# Patient Record
Sex: Male | Born: 1970 | Race: Black or African American | Hispanic: No | Marital: Married | Smoking: Former smoker
Health system: Southern US, Community
[De-identification: ages and names within clinical notes are randomized; demographics above are authoritative.]

## PROBLEM LIST (undated history)

## (undated) DIAGNOSIS — E119 Type 2 diabetes mellitus without complications: Secondary | ICD-10-CM

## (undated) DIAGNOSIS — I2510 Atherosclerotic heart disease of native coronary artery without angina pectoris: Secondary | ICD-10-CM

## (undated) DIAGNOSIS — E669 Obesity, unspecified: Secondary | ICD-10-CM

## (undated) DIAGNOSIS — E781 Pure hyperglyceridemia: Secondary | ICD-10-CM

## (undated) DIAGNOSIS — E785 Hyperlipidemia, unspecified: Secondary | ICD-10-CM

## (undated) DIAGNOSIS — D573 Sickle-cell trait: Secondary | ICD-10-CM

## (undated) DIAGNOSIS — T7840XA Allergy, unspecified, initial encounter: Secondary | ICD-10-CM

## (undated) DIAGNOSIS — R739 Hyperglycemia, unspecified: Secondary | ICD-10-CM

## (undated) HISTORY — DX: Sickle-cell trait: D57.3

## (undated) HISTORY — DX: Pure hyperglyceridemia: E78.1

## (undated) HISTORY — DX: Hyperlipidemia, unspecified: E78.5

## (undated) HISTORY — DX: Obesity, unspecified: E66.9

## (undated) HISTORY — PX: ANKLE SURGERY: SHX546

## (undated) HISTORY — DX: Hyperglycemia, unspecified: R73.9

## (undated) HISTORY — DX: Allergy, unspecified, initial encounter: T78.40XA

---

## 2004-09-15 ENCOUNTER — Ambulatory Visit: Payer: Self-pay | Admitting: Family Medicine

## 2004-09-19 ENCOUNTER — Ambulatory Visit: Payer: Self-pay | Admitting: Family Medicine

## 2004-10-31 ENCOUNTER — Ambulatory Visit: Payer: Self-pay | Admitting: Family Medicine

## 2005-02-25 ENCOUNTER — Ambulatory Visit: Payer: Self-pay | Admitting: Family Medicine

## 2005-08-03 ENCOUNTER — Ambulatory Visit: Payer: Self-pay | Admitting: Family Medicine

## 2005-10-26 ENCOUNTER — Ambulatory Visit: Payer: Self-pay | Admitting: Family Medicine

## 2005-11-04 ENCOUNTER — Ambulatory Visit: Payer: Self-pay | Admitting: Family Medicine

## 2006-02-01 ENCOUNTER — Ambulatory Visit: Payer: Self-pay | Admitting: Family Medicine

## 2007-02-18 ENCOUNTER — Telehealth (INDEPENDENT_AMBULATORY_CARE_PROVIDER_SITE_OTHER): Payer: Self-pay | Admitting: *Deleted

## 2007-03-04 ENCOUNTER — Telehealth (INDEPENDENT_AMBULATORY_CARE_PROVIDER_SITE_OTHER): Payer: Self-pay | Admitting: *Deleted

## 2007-03-04 ENCOUNTER — Ambulatory Visit: Payer: Self-pay | Admitting: Family Medicine

## 2007-03-04 DIAGNOSIS — E781 Pure hyperglyceridemia: Secondary | ICD-10-CM

## 2007-03-10 LAB — CONVERTED CEMR LAB
AST: 27 units/L (ref 0–37)
Cholesterol: 237 mg/dL (ref 0–200)
HDL: 29.6 mg/dL — ABNORMAL LOW (ref >39.0)
Triglycerides: 314 mg/dL (ref 0–149)

## 2007-11-11 DIAGNOSIS — J309 Allergic rhinitis, unspecified: Secondary | ICD-10-CM | POA: Insufficient documentation

## 2007-11-14 ENCOUNTER — Ambulatory Visit: Payer: Self-pay | Admitting: Family Medicine

## 2007-11-14 DIAGNOSIS — E669 Obesity, unspecified: Secondary | ICD-10-CM

## 2007-11-18 LAB — CONVERTED CEMR LAB
AST: 34 units/L (ref 0–37)
Alkaline Phosphatase: 45 units/L (ref 39–117)
BUN: 18 mg/dL (ref 6–23)
Basophils Absolute: 0 10*3/uL (ref 0.0–0.1)
Bilirubin, Direct: 0.1 mg/dL (ref 0.0–0.3)
Chloride: 108 meq/L (ref 96–112)
Eosinophils Absolute: 0.1 10*3/uL (ref 0.0–0.7)
Eosinophils Relative: 2.1 % (ref 0.0–5.0)
GFR calc non Af Amer: 60 mL/min
HDL: 27.8 mg/dL — ABNORMAL LOW (ref >39.0)
MCV: 88.1 fL (ref 78.0–100.0)
Neutrophils Relative %: 56.1 % (ref 43.0–77.0)
Platelets: 253 10*3/uL (ref 150–400)
Potassium: 4.4 meq/L (ref 3.5–5.1)
Total Bilirubin: 0.9 mg/dL (ref 0.3–1.2)
Triglycerides: 359 mg/dL (ref 0–149)
WBC: 5.7 10*3/uL (ref 4.5–10.5)

## 2009-09-06 ENCOUNTER — Ambulatory Visit: Payer: Self-pay | Admitting: Family Medicine

## 2009-09-10 LAB — CONVERTED CEMR LAB
ALT: 39 units/L (ref 0–53)
AST: 24 units/L (ref 0–37)
Albumin: 4.8 g/dL (ref 3.5–5.2)
Alkaline Phosphatase: 53 units/L (ref 39–117)
BUN: 15 mg/dL (ref 6–23)
Basophils Absolute: 0 10*3/uL (ref 0.0–0.1)
Chloride: 103 meq/L (ref 96–112)
Eosinophils Relative: 0 % (ref 0–5)
HCT: 39.1 % (ref 39.0–52.0)
HDL: 24 mg/dL — ABNORMAL LOW (ref >39)
Hemoglobin: 13 g/dL (ref 13.0–17.0)
Lymphocytes Relative: 26 % (ref 12–46)
Lymphs Abs: 2.6 10*3/uL (ref 0.7–4.0)
MCV: 85.6 fL (ref 78.0–100.0)
Neutro Abs: 6.6 10*3/uL (ref 1.7–7.7)
Neutrophils Relative %: 66 % (ref 43–77)
Platelets: 285 10*3/uL (ref 150–400)
Potassium: 3.9 meq/L (ref 3.5–5.3)
RBC: 4.57 M/uL (ref 4.22–5.81)
RDW: 13.2 % (ref 11.5–15.5)
Sodium: 138 meq/L (ref 135–145)
TSH: 3.089 microintl units/mL (ref 0.350–4.500)
Total CHOL/HDL Ratio: 10.8

## 2009-10-14 ENCOUNTER — Ambulatory Visit: Payer: Self-pay | Admitting: Family Medicine

## 2009-10-14 LAB — CONVERTED CEMR LAB
AST: 31 units/L (ref 0–37)
Cholesterol: 244 mg/dL — ABNORMAL HIGH (ref 0–200)
Direct LDL: 93.3 mg/dL
HDL: 31.4 mg/dL — ABNORMAL LOW (ref >39.00)

## 2009-10-18 ENCOUNTER — Ambulatory Visit: Payer: Self-pay | Admitting: Family Medicine

## 2010-01-27 ENCOUNTER — Ambulatory Visit: Payer: Self-pay | Admitting: Family Medicine

## 2010-01-28 LAB — CONVERTED CEMR LAB
HDL: 33.8 mg/dL — ABNORMAL LOW (ref >39.00)
Triglycerides: 314 mg/dL — ABNORMAL HIGH (ref 0.0–149.0)
VLDL: 62.8 mg/dL — ABNORMAL HIGH (ref 0.0–40.0)

## 2010-06-30 ENCOUNTER — Telehealth: Payer: Self-pay | Admitting: Family Medicine

## 2010-07-15 NOTE — Assessment & Plan Note (Signed)
Summary: cpx/ alc   Vital Signs:  Patient profile:   40 year old male Height:      70 inches Weight:      246.75 pounds BMI:     35.53 Temp:     98.3 degrees F oral Pulse rate:   72 / minute Pulse rhythm:   regular BP sitting:   122 / 80  (left arm) Cuff size:   large  Vitals Entered By: Linde Gillis CMA Duncan Dull) (September 06, 2009 2:06 PM) CC: 30 minute exam   History of Present Illness: here for health mt exam and rev of chronic med problems   is feeling pretty good  still taking tricor  diet is not that great -- is trying to bring food from home for snacks -- but eats a lot of peanuts stays away from fried food but eats lots of red meat  avoids starch  [place] sandwhiches - are good  drinks 4 bottles of 20 oz water with crystal lite   still drinks quite a bit of coffee    wt is up 11 lb-- is wearing boots and thinks he has only gained about 5-6 lbs  was going to [employer] 2 times per week -- then had payment issues    bp good at 122/80- no problems with that   Td up to date 06  overdue for lipid check on tricor for trig  Last Lipid ProfileCholesterol: 246 (11/14/2007 10:33:00 AM)HDL:  27.8 (11/14/2007 10:33:00 AM)LDL:  DEL (11/14/2007 10:33:00 AM)Triglycerides over 300 :  Last Liver profileSGOT:  34 (11/14/2007 10:33:00 AM)SPGT:  47 (11/14/2007 10:33:00 AM)T. Bili:  0.9 (11/14/2007 10:33:00 AM)Alk Phos:  45 (11/14/2007 10:33:00 AM)   has been noncompliant for labs-- sausage mc muffin     no new meds or herbs   no prostate cancer in family   no personal prostate problems  no probls with stream  nocturia is about once per night -- at most depending on water     Allergies (verified): No Known Drug Allergies  Past History:  Past Surgical History: Last updated: 11/11/2007 Ankle surgery- no fracture  Family History: Last updated: 09/06/2009 Father: CVA, heavy ETOH, cholesterol, HTN  Mother:  brother- HTN  MGM - brain cancer GF ? 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 cancer lot of depression in family  diabetes on father's side of family  Review of Systems General:  Denies fatigue, loss of appetite, malaise, and weight loss. Eyes:  Denies blurring and eye pain. CV:  Denies chest pain or discomfort, lightheadness, palpitations, and shortness of breath with exertion. Resp:  Denies cough and wheezing. GI:  Denies abdominal pain, bloody stools, change in bowel habits, and indigestion. GU:  Denies nocturia, urinary frequency, and urinary hesitancy. MS:  Denies joint pain, joint redness, and joint swelling. Derm:  Denies lesion(s), poor wound healing, and rash. Neuro:  Denies numbness and tingling. Psych:  Denies anxiety and depression. Endo:  Denies cold intolerance, excessive thirst, excessive urination, and heat intolerance. Heme:  Denies abnormal bruising and bleeding.  Physical Exam  General:  overweight but generally well appearing  Head:  normocephalic, atraumatic, and no abnormalities observed.   Eyes:  vision grossly intact, pupils equal, pupils round, and pupils reactive to light.  no conjunctival pallor, injection or icterus  Ears:  R ear normal and L ear normal.  - scant cerumen Nose:  no nasal discharge.   Mouth:  pharynx pink and moist.   Neck:  supple with full rom and no masses or thyromegally, no JVD or carotid bruit  Chest Wall:  No deformities, masses, tenderness or gynecomastia noted. Lungs:  Normal respiratory effort, chest expands symmetrically. Lungs are  clear to auscultation, no crackles or wheezes. Heart:  Normal rate and regular rhythm. S1 and S2 normal without gallop, murmur, click, rub or other extra sounds. Abdomen:  Bowel sounds positive,abdomen soft and non-tender without masses, organomegaly or hernias noted. no renal bruits  Rectal:  No external abnormalities noted. Normal sphincter tone. No rectal masses or tenderness. Prostate:  Prostate gland firm and smooth, no enlargement, nodularity, tenderness, mass, asymmetry or induration. Msk:  No deformity or scoliosis noted of thoracic or lumbar spine.  no acute joint changes  Pulses:  R and L carotid,radial,femoral,dorsalis pedis and posterior tibial pulses are full and equal bilaterally Extremities:  No clubbing, cyanosis, edema, or deformity noted with normal full range of motion of all joints.   Neurologic:  sensation intact to light touch, gait normal, and DTRs symmetrical and normal.   Skin:  Intact without suspicious lesions or rashes Cervical Nodes:  No lymphadenopathy noted Inguinal Nodes:  No significant adenopathy Psych:  normal affect, talkative and pleasant    Impression & Recommendations:  Problem # 1:  HEALTH MAINTENANCE EXAM (ICD-V70.0) Assessment Comment Only reviewed health habits including diet, exercise and skin cancer prevention reviewed health maintenance list and family history did first DRE today - normal  lab today Orders: Venipuncture (13244) Specimen Handling (01027) T-Lipid Profile (25366-44034) T-Comprehensive Metabolic Panel (74259-56387) T-CBC w/Diff ([id number]) T-TSH (84166-06301) Prescription Created Electronically [id number])  Problem # 2:  HYPERCHOLESTEROLEMIA, PURE (ICD-272.0) Assessment: Unchanged  emphasized imp of better diet and exercise (also compliance with labs and meds) needs to cut all fats in diet - handout given from [facility]  goal to exercise 5 days per week lab today His updated medication list for this problem includes:     Tricor 145 Mg Tabs (Fenofibrate) .[name]... 1 by mouth daily  Orders: Venipuncture (32355) Prescription Created Electronically [id number] [id number])  Labs Reviewed: SGOT: 34 (11/14/2007)   SGPT: 47 (11/14/2007)   HDL:27.8 (11/14/2007), 29.6 (03/04/2007)  LDL:DEL (11/14/2007), DEL (03/04/2007)  Chol:246 (11/14/2007), 237 (03/04/2007)  Trig:359 (11/14/2007), 314 (03/04/2007)  Problem # 3:  HYPERGLYCEMIA (ICD-790.29) Assessment: Unchanged has been watching starches in diet/ although there is wt gain lab today and comment had long disc about lifestyle change --- disc healthy diet (low simple sugar/ choose complex carbs/ low sat fat) diet and exercise in detail  Orders: Venipuncture (25427) Prescription Created Electronically [id number])  Complete Medication List: 1)  Tricor 145 Mg Tabs (Fenofibrate) .[name].. 1 by mouth daily  Patient Instructions: 1)  eat as low fat diet as possible  2)  eat more fruits and vegetables  3)  keep up good water intake 4)  goal is to exercise 5 days per week if possible 5)  labs today  Prescriptions: TRICOR 145 MG  TABS (FENOFIBRATE) 1 by mouth daily  #30 x 11   Entered and Authorized by:   Judith Part MD   Signed by:   [employer]  Rose Fillers MD on 09/06/2009   Method used:   [facility]  [address] [id number]       [address]       [place], [place]  [address]       Ph: [phone] or [phone]       Fax: [phone]   RxID:   [id number]   Current Allergies (reviewed today): No known allergies

## 2010-07-15 NOTE — Assessment & Plan Note (Signed)
Summary: FOLLOW UP AFTER LABS/RI   Vital Signs:  Patient profile:   40 year old male Height:      70 inches Weight:      241.50 pounds BMI:     34.78 Temp:     98.1 degrees F oral Pulse rate:   72 / minute Pulse rhythm:   regular BP sitting:   116 / 74  (left arm) Cuff size:   large  Vitals Entered By: Lewanda Rife LPN (Oct 18, 5100 11:18 AM) CC: follow up after labs   History of Present Illness: here for f/u of labs   wt is down 4 lb is cutting back on eating  eating some oatmeal and bananas  is making a lot of changes   chol is remarkably improving with tricor and diet  trig down from 706 to 423 with HDL 31 adn LDL 93 taking her tricor every day   feels dizzy at times  some problems with concentration   has been working out -- getting lots of exercie -- lots of cardio  drinks 80 oz of water per day   bp great   Allergies (verified): No Known Drug Allergies  Past History:  Past Medical History: Last updated: 09/06/2009 Allergic rhinitis Sickle cell trait obesity hyperglycemia hyperlipidemia - triglycerides   Past Surgical History: Last updated: 11/11/2007 Ankle surgery- no fracture  Family History: Last updated: 09/06/2009 Father: CVA, heavy ETOH, cholesterol, HTN  Mother:  brother- HTN  MGM - brain cancer GF ? 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 Eyes:  Denies blurring and eye irritation. CV:  Denies chest pain or discomfort, lightheadness, and palpitations. Resp:  Denies cough and wheezing. GI:  Denies abdominal pain, bloody stools, change in bowel habits, indigestion, and nausea. GU:  Denies urinary frequency. MS:  Denies muscle  aches and cramps. Derm:  Denies itching, lesion(s), poor wound healing, and rash. Neuro:  Denies numbness and tingling. Psych:  mood is ok . Endo:  Denies cold intolerance, excessive thirst, excessive urination, and heat intolerance. Heme:  Denies abnormal bruising and bleeding.  Physical Exam  General:  overweight but generally well appearing  Head:  normocephalic, atraumatic, and no abnormalities observed.   Eyes:  vision grossly intact, pupils equal, pupils round, and pupils reactive to light.  no conjunctival pallor, injection or icterus  Ears:  R ear normal and L ear normal.   Mouth:  pharynx pink and moist.   Neck:  supple with full rom and no masses or thyromegally, no JVD or carotid bruit  Lungs:  Normal respiratory effort, chest expands symmetrically. Lungs are clear to auscultation, no crackles or wheezes. Heart:  Normal rate and regular rhythm. S1 and S2 normal without gallop, murmur, click, rub or other extra sounds. Abdomen:  no renal bruits  Msk:  No deformity or scoliosis noted of thoracic or lumbar spine.  no acute joint changes  Extremities:  No clubbing, cyanosis, edema, or deformity noted with normal full range of motion of all joints.   Neurologic:  sensation intact to light touch, gait normal, and DTRs symmetrical and normal.   Skin:  Intact without suspicious lesions or rashes Cervical Nodes:  No lymphadenopathy noted Psych:  normal  affect, talkative and pleasant    Impression & Recommendations:  Problem # 1:  HYPERCHOLESTEROLEMIA, PURE (ICD-272.0) Assessment Improved much imp with tricor- trig now in 400s from 700s with med and exercise for only a mo rev low fast diet  rev sources of lean protien today - for exercise that is more intense  adv to keep working on wt loss and avoid starches  check lab in 3 mo and adv disc goals for trig  His updated medication list for this problem includes:    Tricor 145 Mg Tabs (Fenofibrate) .[name]... 1 by mouth  daily  Complete Medication List: 1)  Tricor 145 Mg Tabs (Fenofibrate) .[name].. 1 by mouth daily 2)  Aleve 220 Mg Tabs (Naproxen sodium) .... Otc as directed.  Patient Instructions: 1)  make sure to get some lean protein with every meal  2)  lean meat (chicken, fish, [place], lean pork), low fat dairy like low fat yogurt, milk ,and low fat cheezes , soy milk or other soy product like tofu, nuts  , beans 3)  a protien shake or bar is ok in the times when you need it  4)  keep eating low fat diet and keep up the exercise -- keep loosing weight  5)  schedule fasting labs in 3 months lipid/ast/alt 272   Current Allergies (reviewed today): No known allergies

## 2010-07-17 NOTE — Progress Notes (Signed)
Summary: ins not covering tricor   Phone Note Call from Patient Call back at Home Phone [phone]   Caller: Patient Call For: Judith Part MD Summary of Call: Patient says that insurance is no longer covering tricor. He is asking if there is something else he can take instead. [facility] [address]  Initial call taken by: Melody Comas,  June 30, 2010 10:20 AM  Follow-up for Phone Call        have her call her pharmacy and ask what agents they cover for hypertriglyceridemia -- then call us back--thanks Follow-up by: Judith Part MD,  June 30, 2010 11:29 AM  Additional Follow-up for Phone Call Additional follow up Details #1::        Called patient and was informed that he was busy at the time and will call back. Sydell Axon LPN  June 30, 2010 12:06 PM  Insurance will cover fenofibrate, lipofen and antara.   Lowella Petties CMA, AAMA  June 30, 2010 3:06 PM     Additional Follow-up for Phone Call Additional follow up Details #2::    will go with fenofibrate  px written on EMR for call in schedule fasting lab in 6 weeks lipid/ast/alt 272 thanks Follow-up by: Judith Part MD,  June 30, 2010 3:55 PM  Additional Follow-up for Phone Call Additional follow up Details #3:: Details for Additional Follow-up Action Taken: Rx sent in as directed. Lab appt scheduled for patient. Additional Follow-up by: Janee Morn CMA Duncan Dull),  June 30, 2010 4:58 PM  New/Updated Medications: FENOFIBRATE 160 MG TABS (FENOFIBRATE) 1 by mouth once daily Prescriptions: FENOFIBRATE 160 MG TABS (FENOFIBRATE) 1 by mouth once daily  #30 x 11   Entered by:   Janee Morn CMA (AAMA)   Authorized by:   Judith Part MD   Signed by:   Selena Batten Dance CMA (AAMA) on 06/30/2010   Method used:   [facility]  [address] [id number]       [address]       [place], [place]  [address]       Ph: [phone] or [phone]       Fax: [phone]  RxID:   [id number]

## 2010-08-04 ENCOUNTER — Other Ambulatory Visit: Payer: Self-pay | Admitting: Family Medicine

## 2010-08-04 ENCOUNTER — Other Ambulatory Visit (INDEPENDENT_AMBULATORY_CARE_PROVIDER_SITE_OTHER): Payer: 59

## 2010-08-04 ENCOUNTER — Encounter (INDEPENDENT_AMBULATORY_CARE_PROVIDER_SITE_OTHER): Payer: Self-pay | Admitting: *Deleted

## 2010-08-04 DIAGNOSIS — E7800 Pure hypercholesterolemia, unspecified: Secondary | ICD-10-CM

## 2010-08-04 DIAGNOSIS — R7309 Other abnormal glucose: Secondary | ICD-10-CM

## 2010-08-04 DIAGNOSIS — E785 Hyperlipidemia, unspecified: Secondary | ICD-10-CM

## 2010-08-04 LAB — HEPATIC FUNCTION PANEL
AST: 27 U/L (ref 0–37)
Albumin: 4.3 g/dL (ref 3.5–5.2)
Alkaline Phosphatase: 40 U/L (ref 39–117)
Total Bilirubin: 0.6 mg/dL (ref 0.3–1.2)

## 2010-08-04 LAB — CBC WITH DIFFERENTIAL/PLATELET
Basophils Absolute: 0 10*3/uL (ref 0.0–0.1)
Basophils Relative: 0.3 % (ref 0.0–3.0)
Eosinophils Relative: 4.2 % (ref 0.0–5.0)
Hemoglobin: 12.6 g/dL — ABNORMAL LOW (ref 13.0–17.0)
Lymphocytes Relative: 38.7 % (ref 12.0–46.0)
Monocytes Relative: 8.8 % (ref 3.0–12.0)
Neutro Abs: 2.9 10*3/uL (ref 1.4–7.7)
RBC: 4.14 Mil/uL — ABNORMAL LOW (ref 4.22–5.81)
WBC: 6 10*3/uL (ref 4.5–10.5)

## 2010-08-04 LAB — LIPID PANEL
HDL: 35.2 mg/dL — ABNORMAL LOW (ref >39.00)
Total CHOL/HDL Ratio: 6
Triglycerides: 347 mg/dL — ABNORMAL HIGH (ref 0.0–149.0)

## 2010-08-04 LAB — BASIC METABOLIC PANEL
Calcium: 9.3 mg/dL (ref 8.4–10.5)
GFR: 74.08 mL/min (ref >60.00)
Sodium: 141 mEq/L (ref 135–145)

## 2010-08-11 ENCOUNTER — Encounter: Payer: Self-pay | Admitting: Family Medicine

## 2010-08-11 ENCOUNTER — Ambulatory Visit (INDEPENDENT_AMBULATORY_CARE_PROVIDER_SITE_OTHER): Payer: 59 | Admitting: Family Medicine

## 2010-08-11 DIAGNOSIS — E7800 Pure hypercholesterolemia, unspecified: Secondary | ICD-10-CM

## 2010-08-11 DIAGNOSIS — R7309 Other abnormal glucose: Secondary | ICD-10-CM

## 2010-08-11 DIAGNOSIS — Z8249 Family history of ischemic heart disease and other diseases of the circulatory system: Secondary | ICD-10-CM

## 2010-08-21 NOTE — Assessment & Plan Note (Signed)
Summary: WANT TO HAVE SOME TESTS DONE / LFW   Vital Signs:  Patient profile:   40 year old male Height:      70 inches Weight:      226.25 pounds BMI:     32.58 Temp:     99.1 degrees F oral Pulse rate:   68 / minute Pulse rhythm:   regular BP sitting:   116 / 68  (left arm) Cuff size:   large  Vitals Entered By: Lewanda Rife LPN (August 11, 2010 8:34 AM) CC: wants testing done   History of Present Illness: here because she wants testing done and also for f/u of lipids and obesity and hyperglycemia   wt is down 15 lb with bmi of 32-- peak of 250 with his scale in past  is doing well  eating a fair amt of lean protien  going to spin classes   116/68- good bp   lipids similar to last time trig 347 and HDL 35 and LDL 98  on fenofibrate 160  sugar 97 fasting   is concerned because his mom just had a heart attack she has high chol - no other risk factors   Allergies (verified): No Known Drug Allergies  Past History:  Past Medical History: Last updated: 09/06/2009 Allergic rhinitis Sickle cell trait obesity hyperglycemia hyperlipidemia - triglycerides   Past Surgical History: Last updated: 11/11/2007 Ankle surgery- no fracture  Family History: Last updated: 08/11/2010 Father: seizure alcoholic , heavy ETOH, cholesterol, HTN  Mother: CAD/ MI 3, high chol  brother- HTN  MGM - brain cancer GF ? 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 cancer lot of depression in family  diabetes on father's side of family  Review of Systems General:  Denies fatigue, fever, loss of appetite, and  malaise. Eyes:  Denies blurring and eye irritation. CV:  Denies chest pain or discomfort, palpitations, shortness of breath with exertion, and swelling of feet. Resp:  Denies cough, shortness of breath, and wheezing. GI:  Denies abdominal pain, change in bowel habits, indigestion, and nausea. GU:  Denies hematuria and urinary frequency. MS:  Denies joint pain, joint swelling, muscle weakness, and stiffness. Derm:  Denies itching, lesion(s), poor wound healing, and rash. Neuro:  Denies numbness and tingling. Psych:  Denies anxiety and depression. Endo:  Denies cold intolerance, excessive thirst, excessive urination, and heat intolerance. Heme:  Denies abnormal bruising and bleeding.  Physical Exam  General:  overweight but generally well appearing some wt loss noted  Head:  normocephalic, atraumatic, and no abnormalities observed.   Eyes:  vision grossly intact, pupils equal, pupils round, and pupils reactive to light.  no conjunctival pallor, injection or icterus  Mouth:  pharynx pink and moist.   Neck:  supple with full rom and no masses or thyromegally, no JVD or carotid bruit  Chest Wall:  No deformities, masses, tenderness or gynecomastia noted. Lungs:  Normal respiratory effort, chest expands symmetrically. Lungs are clear to auscultation, no crackles or wheezes. Heart:  Normal rate and regular rhythm. S1 and S2 normal without gallop, murmur, click, rub or other extra sounds. Msk:  No deformity or  scoliosis noted of thoracic or lumbar spine.  no acute joint changes  Extremities:  no cce  Neurologic:  sensation intact to light touch, gait normal, and DTRs symmetrical and normal.   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 1 by mouth once daily  Orders: EKG w/ Interpretation (93000) Cardiology Referral (Cardiology)  Problem # 2:  CORONARY ARTERY DISEASE, FAMILY HX (ICD-V17.3)  in mother with hx of high cholesterol as well- at age 20 pt worried about his risks nl EKG today will set up tread mill stress test if his ins covers  Orders: EKG w/ Interpretation (93000) Cardiology Referral (Cardiology)  Problem # 3:  HYPERGLYCEMIA (ICD-790.29) Assessment: Unchanged  this is wel controlled with diet now - commended on low glycemic diet and exercise  Orders: EKG w/ Interpretation (93000)  Complete Medication List: 1)  Aleve 220 Mg Tabs (Naproxen sodium) .... Otc as directed. 2)  Fenofibrate 160 Mg Tabs (Fenofibrate) .[name].. 1 by mouth once daily  Patient Instructions: 1)  you are doing a great job with weight loss and diet and exercise  2)  keep it up  3)  focus on more fruits (apples) and vegetables  4)  continue low fat diet  5)  we will schedule a treadmill stress test at check out  6)  schedule fasting labs and then follow up in about 6 months lipid/ast/alt/ renal 272    Orders Added: 1)  EKG w/ Interpretation [93000] 2)  Cardiology Referral [Cardiology] 3)  Est. Patient Level IV [91478]    Current Allergies (reviewed today): No known allergies      EKG  Procedure date:  09/09/2010  Findings:      sinus rhythm rate 71 with few pvcs  no acute changes

## 2010-12-22 ENCOUNTER — Telehealth: Payer: Self-pay | Admitting: *Deleted

## 2010-12-22 NOTE — Telephone Encounter (Signed)
When I look up the drug side effects - ED is not listed as one  There have been just a few reports (rare) of decreased libido , however Stop the med for 2 weeks - just to see if improvement occurs and let me know  If not will need to disc further - either with me or urologist (can refer )  Do not try the testosterone product  Call in 2 wk and update me please

## 2010-12-22 NOTE — Telephone Encounter (Signed)
Patient notified as instructed by telephone. Pt will call back in 2 wks with update.

## 2010-12-22 NOTE — Telephone Encounter (Signed)
Pt has been having ED problems off and on for a year and he is asking if the fenofibrate that he has been taking could have anything to do with that.  He says he was ok before he started taking that 2 years ago.  Also, he is asking if he should take testosterone product that he has seen advertised.  Please advise.

## 2011-01-05 ENCOUNTER — Telehealth: Payer: Self-pay | Admitting: *Deleted

## 2011-01-05 NOTE — Telephone Encounter (Signed)
I think we addressed this in his last phone note -- I looked it up in a drug database and I see a rare poss of decreased libido but I did not see ED as a side eff If he is convinced this is it -- stop med for 1-2 weeks and let me know if symptom improves (I will be very curious to know )

## 2011-01-05 NOTE — Telephone Encounter (Signed)
Patient notified as instructed by telephone. Pt said he has stopped the med for 2 weeks and the symptoms ED have not improved. Also pt said  The whites of his eyes have been yellow for couple of months. Suggested pt come in for evaluation but pt said to ask Dr Milinda Antis first.Please advise.

## 2011-01-05 NOTE — Telephone Encounter (Signed)
Stay off med and f/u to examine eyes - that could be worrisome for jaundice/ liver problem Please follow up

## 2011-01-05 NOTE — Telephone Encounter (Signed)
Pt has been having problems with ED and he thinks this is being caused by the fenofibrate that he is taking. He says he has had this problem before but it ironed itself out.  Please advise on whether the medicine can be causing this.

## 2011-01-06 NOTE — Telephone Encounter (Signed)
Patient notified as instructed by telephone. Pt said the only day he can come in is on a Monday. Pt scheduled appt with Dr Milinda Antis 01/12/11 at 8:45 am.

## 2011-01-09 ENCOUNTER — Encounter: Payer: Self-pay | Admitting: Family Medicine

## 2011-01-12 ENCOUNTER — Ambulatory Visit (INDEPENDENT_AMBULATORY_CARE_PROVIDER_SITE_OTHER): Payer: 59 | Admitting: Family Medicine

## 2011-01-12 ENCOUNTER — Ambulatory Visit (INDEPENDENT_AMBULATORY_CARE_PROVIDER_SITE_OTHER)
Admission: RE | Admit: 2011-01-12 | Discharge: 2011-01-12 | Disposition: A | Discharge status: Home / Self Care | Payer: 59 | Source: Ambulatory Visit | Attending: Family Medicine | Admitting: Family Medicine

## 2011-01-12 ENCOUNTER — Encounter: Payer: Self-pay | Admitting: Family Medicine

## 2011-01-12 DIAGNOSIS — Z113 Encounter for screening for infections with a predominantly sexual mode of transmission: Secondary | ICD-10-CM | POA: Insufficient documentation

## 2011-01-12 DIAGNOSIS — S42409A Unspecified fracture of lower end of unspecified humerus, initial encounter for closed fracture: Secondary | ICD-10-CM

## 2011-01-12 DIAGNOSIS — M25522 Pain in left elbow: Secondary | ICD-10-CM

## 2011-01-12 DIAGNOSIS — M25529 Pain in unspecified elbow: Secondary | ICD-10-CM

## 2011-01-12 DIAGNOSIS — R7309 Other abnormal glucose: Secondary | ICD-10-CM

## 2011-01-12 DIAGNOSIS — E7800 Pure hypercholesterolemia, unspecified: Secondary | ICD-10-CM

## 2011-01-12 DIAGNOSIS — S42402A Unspecified fracture of lower end of left humerus, initial encounter for closed fracture: Secondary | ICD-10-CM | POA: Insufficient documentation

## 2011-01-12 LAB — BASIC METABOLIC PANEL
BUN: 15 mg/dL (ref 6–23)
CO2: 26 mEq/L (ref 19–32)
Calcium: 9.1 mg/dL (ref 8.4–10.5)
GFR: 77.18 mL/min (ref >60.00)
Glucose, Bld: 87 mg/dL (ref 70–99)

## 2011-01-12 LAB — LIPID PANEL
HDL: 33.1 mg/dL — ABNORMAL LOW (ref >39.00)
Total CHOL/HDL Ratio: 8
Triglycerides: 943 mg/dL — ABNORMAL HIGH (ref 0.0–149.0)

## 2011-01-12 LAB — HEPATIC FUNCTION PANEL: Total Bilirubin: 0.2 mg/dL — ABNORMAL LOW (ref 0.3–1.2)

## 2011-01-12 LAB — RPR

## 2011-01-12 NOTE — Assessment & Plan Note (Signed)
Wt is up  a1c today Is eating healthy but not exercising

## 2011-01-12 NOTE — Patient Instructions (Signed)
X rays of elbow today  Labs today for cholesterol/ liver / sugar and STD screen  Stay off cholesterol medicine for now  Continue working out - avoiding the L elbow L elbow x ray today Follow up with me in about a week please

## 2011-01-12 NOTE — Assessment & Plan Note (Signed)
Labs today after fenofibrate - will likely re start it  Disc low fat diet  Wt is up 10 lb without working out

## 2011-01-12 NOTE — Assessment & Plan Note (Signed)
olcrenon pain after injury without swelling Xray today Ice  Relative rest  F/u 1 week

## 2011-01-12 NOTE — Progress Notes (Signed)
Subjective:    Patient ID: Andrew Burgess, male    DOB: 1970-08-18, 40 y.o.   MRN: [id number]  HPI Here for f/u of hypertriglyceremia and also possible jaundice and L elbow pain after injury  Last labs on fenfibrate his trig were 347 Nl LFTs  That was in February   Wondered if the drug caused ED and stopped it for 2 wk with no improvement  And his testicles are hurting  Never had trouble like this before   Is very stressed - is going through a divorce  Does want to be tested for stds   Wt is up 10 lb Has not been able to go to the gym due to elbow    Noticed some yellowing of his eyes  They look ok today  Rides with red light on in truck all night  Skin did not turn colors  occ slight low abdominal pain - on and off  No RUQ pain at all  No junk food- eating healthy   Even lower back is sore  achey in general   Wt is up 10 lb  Never exp to hepatitis    Patient Active Problem List  Diagnoses  . HYPERCHOLESTEROLEMIA, PURE  . OBESITY  . ALLERGIC RHINITIS  . HYPERGLYCEMIA  . Screen for STD (sexually transmitted disease)  . Left elbow pain  . Elbow fracture, left   Past Medical History  Diagnosis Date  . Allergy   . Sickle cell trait   . Obesity   . Hyperglycemia   . Hyperlipidemia     triglyceriedes    Past Surgical History  Procedure Date  . Ankle surgery    History  Substance Use Topics  . Smoking status: Never Smoker   . Smokeless tobacco: Not on file  . Alcohol Use: Yes     Occasionally    Family History  Problem Relation Age of Onset  . Coronary artery disease Mother   . Heart attack Mother   . Hyperlipidemia Mother   . Alcohol abuse Father   . Hyperlipidemia Father   . Hypertension Father   . Hypertension Brother   . Cancer Maternal Grandmother     brain   No Known Allergies Current Outpatient Prescriptions on File Prior to Visit  Medication Sig Dispense Refill  . fenofibrate 160 MG tablet Take 160 mg by mouth daily.        . naproxen  sodium (ALEVE) 220 MG tablet Take 220 mg by mouth as directed.             Review of Systems Review of Systems  Constitutional: Negative for fever, appetite change, fatigue and unexpected weight change.  Eyes: Negative for pain and visual disturbance.  Respiratory: Negative for cough and shortness of breath.   Cardiovascular: Negative for cp or palp .   Gastrointestinal: Negative for nausea, diarrhea and constipation.  Genitourinary: Negative for urgency and frequency. pos for ED Skin: Negative for pallor. or rash  Neurological: Negative for weakness, light-headedness, numbness and headaches.  Hematological: Negative for adenopathy. Does not bruise/bleed easily.  Psychiatric/Behavioral: Negative for dysphoric mood. He is feeling anxious no SI         Objective:   Physical Exam  Constitutional: He appears well-developed and well-nourished. No distress.       overwt and well appearing   HENT:  Head: Normocephalic and atraumatic.  Mouth/Throat: Oropharynx is clear and moist.  Eyes: Conjunctivae and EOM are normal. Pupils are equal, round, and reactive to  light.  Neck: Normal range of motion. Neck supple.  Cardiovascular: Normal rate, regular rhythm, normal heart sounds and intact distal pulses.   Pulmonary/Chest: Effort normal and breath sounds normal. No respiratory distress. He has no wheezes.  Abdominal: Soft. Bowel sounds are normal. He exhibits no distension and no mass. There is no tenderness.       No suprapubic tenderness    Genitourinary: Penis normal. No penile tenderness.       No testicular masses No d/c  Musculoskeletal: Normal range of motion. He exhibits no edema.       L elbow tender over olcrenon  Limited rom  slt to no swelling  No crepitice No dislocation  Neurological: He is alert. He displays normal reflexes. Coordination normal.  Skin: Skin is warm and dry. No rash noted. No erythema. No pallor.  Psychiatric:       Seems mildly anxious today Eye  contact is fair          Assessment & Plan:   No problem-specific assessment & plan notes found for this encounter.

## 2011-01-12 NOTE — Assessment & Plan Note (Signed)
Per pt request incl hepatitis screen in light of ? Past jaundice (none today- told to avoid alcohol or otc meds) Will disc at 1 wk f/u Going through a divorce

## 2011-01-13 LAB — HIV ANTIBODY (ROUTINE TESTING W REFLEX): HIV: NONREACTIVE

## 2011-01-13 LAB — HEPATITIS PANEL, ACUTE
HCV Ab: NEGATIVE
Hep A IgM: NEGATIVE

## 2011-01-16 ENCOUNTER — Telehealth: Payer: Self-pay

## 2011-01-16 NOTE — Telephone Encounter (Signed)
Left v/m for pt to call back. Triglycerides were 943.

## 2011-01-16 NOTE — Telephone Encounter (Signed)
Message copied by Patience Musca on Fri Jan 16, 2011  5:38 PM ------      Message from: Roxy Manns A      Created: Thu Jan 15, 2011  5:12 PM       Let him know labs all look normal except very high triglycerides      Please watch diet      Will disc further at f/u

## 2011-01-18 NOTE — Assessment & Plan Note (Signed)
On review of x ray bone fragment is seen distal to olcrenon on lat view Poss fracture Ref to ortho asap

## 2011-01-19 ENCOUNTER — Ambulatory Visit (INDEPENDENT_AMBULATORY_CARE_PROVIDER_SITE_OTHER): Payer: 59 | Admitting: Family Medicine

## 2011-01-19 ENCOUNTER — Encounter: Payer: Self-pay | Admitting: Family Medicine

## 2011-01-19 DIAGNOSIS — S42409A Unspecified fracture of lower end of unspecified humerus, initial encounter for closed fracture: Secondary | ICD-10-CM

## 2011-01-19 DIAGNOSIS — F430 Acute stress reaction: Secondary | ICD-10-CM

## 2011-01-19 DIAGNOSIS — E7800 Pure hypercholesterolemia, unspecified: Secondary | ICD-10-CM

## 2011-01-19 DIAGNOSIS — S42402A Unspecified fracture of lower end of left humerus, initial encounter for closed fracture: Secondary | ICD-10-CM

## 2011-01-19 DIAGNOSIS — Z113 Encounter for screening for infections with a predominantly sexual mode of transmission: Secondary | ICD-10-CM

## 2011-01-19 DIAGNOSIS — R7309 Other abnormal glucose: Secondary | ICD-10-CM

## 2011-01-19 NOTE — Assessment & Plan Note (Signed)
Re assuing sugar and a1c despite wt Pt will continue working on low sugar diet and wt loss

## 2011-01-19 NOTE — Assessment & Plan Note (Signed)
Mild- with evulsion of bone spur tx consv with ortho/ wrapping  Doing much better

## 2011-01-19 NOTE — Assessment & Plan Note (Signed)
Trig in 900s Pt will resume low fat diet and exercise and start back with fenofibrate Low fat diet reviewed  Lab 1 mo  F/u 3 mo

## 2011-01-19 NOTE — Telephone Encounter (Signed)
Pt was in to see Dr Milinda Antis this AM. Discussed labs then

## 2011-01-19 NOTE — Patient Instructions (Addendum)
Start the fenofibrate  sched fasting labs 1 month  We will do counseling referral at check out  Follow up with me in 3 months please  Eat lowest fat diet you can - with more fruits and vegetables

## 2011-01-19 NOTE — Assessment & Plan Note (Signed)
Rev all tests - neg reassuring

## 2011-01-19 NOTE — Assessment & Plan Note (Signed)
Stress issues with marriage ? If may get a divorce  Wants individual counseling and poss couple counseling later Disc symptoms and coping skills >25 min spent with face to face with patient, >50% counseling and/or coordinating care   Counseling ref made

## 2011-01-19 NOTE — Progress Notes (Signed)
Subjective:    Patient ID: Andrew Burgess, male    DOB: 12-06-1970, 40 y.o.   MRN: [id number]  HPI Here for f/u of hyperlipidemia / std screen / elbow fx and ? Jaundice (at last visit ) and hyperglycemia Overall labs are reassuring except for chol   Chol very high with trig of 943 and LDL 84 Is open to getting on the fenofibrate - has it ready at the pharmcy  Lab Results  Component Value Date   CHOL 261* 01/12/2011   CHOL 210* 08/04/2010   CHOL 205* 01/27/2010   Lab Results  Component Value Date   HDL 33.10* 01/12/2011   HDL 35.20* 08/04/2010   HDL 33.80* 01/27/2010   Lab Results  Component Value Date   LDLCALC See Comment mg/dL 09/21/8117   Lab Results  Component Value Date   TRIG 943.0* 01/12/2011   TRIG 347.0* 08/04/2010   TRIG 314.0* 01/27/2010   Lab Results  Component Value Date   CHOLHDL 8 01/12/2011   CHOLHDL 6 08/04/2010   CHOLHDL 6 01/27/2010   Lab Results  Component Value Date   LDLDIRECT 84.2 01/12/2011   LDLDIRECT 98.3 08/04/2010   LDLDIRECT 96.6 01/27/2010     Std tests all neg incl hepatitis  Pt is relieved about that and has no symptoms   a1c turned out low at 5 with nl sugar  LFTs are totally nl incl bilirubin  No jaundice when seen last time   He did end up with an elbow fracture-saw ortho and having conserv tx  Is feeling a lot better- can move it more  Is in ace bandage  No more pain med   At home - some problems  ? If getting divorce or not  Is interested in outside ref - [facility] forcounseling  Individual and then maybe couples Feeling anxious/ down and keyed up  This makes it hard to work/ etc  No suicidal intention Is tired in general  Eating better - going to the gym--proud of this   Patient Active Problem List  Diagnoses  . HYPERCHOLESTEROLEMIA, PURE  . OBESITY  . ALLERGIC RHINITIS  . HYPERGLYCEMIA  . Screen for STD (sexually transmitted disease)  . Elbow fracture, left  . Stress reaction   Past Medical History  Diagnosis Date  .  Allergy   . Sickle cell trait   . Obesity   . Hyperglycemia   . Hyperlipidemia     triglyceriedes    Past Surgical History  Procedure Date  . Ankle surgery    History  Substance Use Topics  . Smoking status: Never Smoker   . Smokeless tobacco: Not on file  . Alcohol Use: Yes     Occasionally    Family History  Problem Relation Age of Onset  . Coronary artery disease Mother   . Heart attack Mother   . Hyperlipidemia Mother   . Alcohol abuse Father   . Hyperlipidemia Father   . Hypertension Father   . Hypertension Brother   . Cancer Maternal Grandmother     brain   No Known Allergies Current Outpatient Prescriptions on File Prior to Visit  Medication Sig Dispense Refill  . fenofibrate 160 MG tablet Take 160 mg by mouth daily.        [name] ibuprofen (ADVIL,MOTRIN) 200 MG tablet Take 400 mg by mouth every 6 (six) hours as needed.        . naproxen sodium (ALEVE) 220 MG tablet Take 220 mg by mouth as directed.  Review of Systems Review of Systems  Constitutional: Negative for fever, appetite change, fatigue and unexpected weight change.  Eyes: Negative for pain and visual disturbance.  Respiratory: Negative for cough and shortness of breath.   Cardiovascular: Negative.  for cp or sob or palpitaitons Gastrointestinal: Negative for nausea, diarrhea and constipation.  Genitourinary: Negative for urgency and frequency.  MSK pos for elbow pain that is improving  Skin: Negative for pallor.  Neurological: Negative for weakness, light-headedness, numbness and headaches.  Hematological: Negative for adenopathy. Does not bruise/bleed easily.  Psychiatric/Behavioral: generally anxious and worried / not suicidal        Objective:   Physical Exam  Constitutional: He appears well-developed and well-nourished. No distress.       overwt and well appearing   HENT:  Head: Normocephalic and atraumatic.  Mouth/Throat: Oropharynx is clear and moist.  Eyes: Conjunctivae  and EOM are normal. Pupils are equal, round, and reactive to light.  Neck: Normal range of motion. Neck supple. No JVD present. Carotid bruit is not present. No thyromegaly present.  Cardiovascular: Normal rate, regular rhythm, normal heart sounds and intact distal pulses.   Pulmonary/Chest: Effort normal and breath sounds normal. No respiratory distress. He has no wheezes.  Abdominal: Soft. Bowel sounds are normal. There is no tenderness.  Musculoskeletal: He exhibits no edema and no tenderness.       Elbow in a brace today  Lymphadenopathy:    He has no cervical adenopathy.  Neurological: He is alert. He has normal reflexes. Coordination normal.  Skin: Skin is warm and dry. No rash noted. No erythema. No pallor.  Psychiatric:       Overall preoccupied and worried Good eye contact and communication skills  Not tearful          Assessment & Plan:

## 2011-01-26 ENCOUNTER — Ambulatory Visit (INDEPENDENT_AMBULATORY_CARE_PROVIDER_SITE_OTHER): Payer: 59 | Admitting: Licensed Clinical Social Worker

## 2011-01-26 DIAGNOSIS — F4323 Adjustment disorder with mixed anxiety and depressed mood: Secondary | ICD-10-CM

## 2011-02-02 ENCOUNTER — Other Ambulatory Visit: Payer: 59

## 2011-02-02 ENCOUNTER — Ambulatory Visit (INDEPENDENT_AMBULATORY_CARE_PROVIDER_SITE_OTHER): Payer: 59 | Admitting: Licensed Clinical Social Worker

## 2011-02-02 DIAGNOSIS — F4323 Adjustment disorder with mixed anxiety and depressed mood: Secondary | ICD-10-CM

## 2011-02-09 ENCOUNTER — Ambulatory Visit (INDEPENDENT_AMBULATORY_CARE_PROVIDER_SITE_OTHER): Payer: 59 | Admitting: Licensed Clinical Social Worker

## 2011-02-09 ENCOUNTER — Ambulatory Visit: Payer: 59 | Admitting: Family Medicine

## 2011-02-09 DIAGNOSIS — F4323 Adjustment disorder with mixed anxiety and depressed mood: Secondary | ICD-10-CM

## 2011-02-23 ENCOUNTER — Other Ambulatory Visit (INDEPENDENT_AMBULATORY_CARE_PROVIDER_SITE_OTHER): Payer: 59

## 2011-02-23 ENCOUNTER — Ambulatory Visit: Payer: 59 | Admitting: Licensed Clinical Social Worker

## 2011-02-23 DIAGNOSIS — E7800 Pure hypercholesterolemia, unspecified: Secondary | ICD-10-CM

## 2011-02-23 LAB — LIPID PANEL
HDL: 38.6 mg/dL — ABNORMAL LOW (ref >39.00)
Triglycerides: 399 mg/dL — ABNORMAL HIGH (ref 0.0–149.0)
VLDL: 79.8 mg/dL — ABNORMAL HIGH (ref 0.0–40.0)

## 2011-02-23 LAB — AST: AST: 30 U/L (ref 0–37)

## 2011-03-02 ENCOUNTER — Ambulatory Visit (INDEPENDENT_AMBULATORY_CARE_PROVIDER_SITE_OTHER): Payer: 59 | Admitting: Licensed Clinical Social Worker

## 2011-03-02 DIAGNOSIS — F4323 Adjustment disorder with mixed anxiety and depressed mood: Secondary | ICD-10-CM

## 2011-03-16 ENCOUNTER — Ambulatory Visit (INDEPENDENT_AMBULATORY_CARE_PROVIDER_SITE_OTHER): Payer: 59 | Admitting: Licensed Clinical Social Worker

## 2011-03-16 DIAGNOSIS — F4323 Adjustment disorder with mixed anxiety and depressed mood: Secondary | ICD-10-CM

## 2011-03-23 ENCOUNTER — Ambulatory Visit (INDEPENDENT_AMBULATORY_CARE_PROVIDER_SITE_OTHER): Payer: 59 | Admitting: Licensed Clinical Social Worker

## 2011-03-23 DIAGNOSIS — F4323 Adjustment disorder with mixed anxiety and depressed mood: Secondary | ICD-10-CM

## 2011-04-06 ENCOUNTER — Ambulatory Visit (INDEPENDENT_AMBULATORY_CARE_PROVIDER_SITE_OTHER): Payer: 59 | Admitting: Licensed Clinical Social Worker

## 2011-04-06 DIAGNOSIS — F4323 Adjustment disorder with mixed anxiety and depressed mood: Secondary | ICD-10-CM

## 2011-04-20 ENCOUNTER — Ambulatory Visit: Payer: 59 | Admitting: Licensed Clinical Social Worker

## 2011-04-27 ENCOUNTER — Ambulatory Visit: Payer: 59 | Admitting: Family Medicine

## 2011-08-10 ENCOUNTER — Other Ambulatory Visit: Payer: Self-pay | Admitting: Family Medicine

## 2012-04-18 ENCOUNTER — Other Ambulatory Visit: Payer: Self-pay | Admitting: Family Medicine

## 2012-04-18 NOTE — Telephone Encounter (Signed)
Left voicemail requesting pt to call office, will try to call again

## 2012-04-18 NOTE — Telephone Encounter (Signed)
No recent appt no labs and no future appt. Ok to refill?

## 2012-04-18 NOTE — Telephone Encounter (Signed)
Schedule f/u with me this winter and can refil med until then, thanks

## 2012-04-20 ENCOUNTER — Encounter: Payer: Self-pay | Admitting: *Deleted

## 2012-04-20 NOTE — Telephone Encounter (Signed)
appt scheduled for 05/23/12 and med refilled till then

## 2012-05-23 ENCOUNTER — Ambulatory Visit (INDEPENDENT_AMBULATORY_CARE_PROVIDER_SITE_OTHER): Payer: 59 | Admitting: Family Medicine

## 2012-05-23 ENCOUNTER — Encounter: Payer: Self-pay | Admitting: Family Medicine

## 2012-05-23 VITALS — BP 108/64 | HR 67 | Temp 98.6°F | Ht 70.0 in | Wt 241.5 lb

## 2012-05-23 DIAGNOSIS — E669 Obesity, unspecified: Secondary | ICD-10-CM

## 2012-05-23 DIAGNOSIS — E7800 Pure hypercholesterolemia, unspecified: Secondary | ICD-10-CM

## 2012-05-23 DIAGNOSIS — R7309 Other abnormal glucose: Secondary | ICD-10-CM

## 2012-05-23 DIAGNOSIS — Z23 Encounter for immunization: Secondary | ICD-10-CM

## 2012-05-23 LAB — COMPREHENSIVE METABOLIC PANEL
ALT: 35 U/L (ref 0–53)
AST: 27 U/L (ref 0–37)
Albumin: 4.5 g/dL (ref 3.5–5.2)
BUN: 11 mg/dL (ref 6–23)
CO2: 26 mEq/L (ref 19–32)
Calcium: 9.3 mg/dL (ref 8.4–10.5)
Chloride: 105 mEq/L (ref 96–112)
Potassium: 4.2 mEq/L (ref 3.5–5.1)

## 2012-05-23 LAB — LIPID PANEL: Triglycerides: 446 mg/dL — ABNORMAL HIGH (ref 0.0–149.0)

## 2012-05-23 LAB — HEMOGLOBIN A1C: Hgb A1c MFr Bld: 5.1 % (ref 4.6–6.5)

## 2012-05-23 LAB — LDL CHOLESTEROL, DIRECT: Direct LDL: 98.1 mg/dL

## 2012-05-23 MED ORDER — FENOFIBRATE 160 MG PO TABS: 160.0000 mg | ORAL_TABLET | Freq: Every day | ORAL | Status: DC

## 2012-05-23 NOTE — Assessment & Plan Note (Signed)
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

## 2012-05-23 NOTE — Assessment & Plan Note (Signed)
Discussed how this problem influences overall health and the risks it imposes  Reviewed plan for weight loss with lower calorie diet (via better food choices and also portion control or program like weight watchers) and exercise building up to or more than 30 minutes 5 days per week including some aerobic activity    Cutting portions will be most important for him

## 2012-05-23 NOTE — Patient Instructions (Addendum)
Flu vaccine today  Take good care of yourself- watch fats in diet Continue medicine Lab today for cholesterol and also sugar Work on weight loss- exercise more and cut portions and make good food choices

## 2012-05-23 NOTE — Assessment & Plan Note (Signed)
a1c today Did gain wt  Disc risks of DM Disc low glycemic diet

## 2012-05-23 NOTE — Progress Notes (Signed)
Subjective:    Patient ID: Andrew Burgess, male    DOB: 1970/12/26, 41 y.o.   MRN: [id number]  HPI  Here for f/u of chronic medical problems Doing ok overall  Has a slight cold today-not bad  For high triglycerides on fenofibrate 160 Lab Results  Component Value Date   CHOL 221* 02/23/2011   CHOL 261* 01/12/2011   CHOL 210* 08/04/2010   Lab Results  Component Value Date   HDL 38.60* 02/23/2011   HDL 33.10* 01/12/2011   HDL 35.20* 08/04/2010   Lab Results  Component Value Date   LDLCALC See Comment mg/dL 02/11/5620   Lab Results  Component Value Date   TRIG 399.0* 02/23/2011   TRIG 943.0* 01/12/2011   TRIG 347.0* 08/04/2010   Lab Results  Component Value Date   CHOLHDL 6 02/23/2011   CHOLHDL 8 01/12/2011   CHOLHDL 6 08/04/2010   Lab Results  Component Value Date   LDLDIRECT 87.0 02/23/2011   LDLDIRECT 84.2 01/12/2011   LDLDIRECT 98.3 08/04/2010   there was improvement- then lost to f/u Is still taking fenofibrate - no missed doses   Wt is up 7 lb Obese with bmi of 34 Got  Bored with the gym - needs to go more often  Is eating healthy - no more fast food  Too big portions  Tries hard to stay away from fatty foods    Blood sugar- watching sweets in diet  With wt gain is at risk of DM- aware of this  No thirst or excessive urination Due for lab Lab Results  Component Value Date   HGBA1C 5.0 01/12/2011    Flu status - needs a flu shot    Review of Systems Review of Systems  Constitutional: Negative for fever, appetite change, fatigue and unexpected weight change.  ENT pos for stuffy and runny nose, neg for facial pain  Eyes: Negative for pain and visual disturbance.  Respiratory: Negative for cough and shortness of breath.   Cardiovascular: Negative for cp or palpitations    Gastrointestinal: Negative for nausea, diarrhea and constipation.  Genitourinary: Negative for urgency and frequency.  Skin: Negative for pallor or rash   Neurological: Negative for  weakness, light-headedness, numbness and headaches.  Hematological: Negative for adenopathy. Does not bruise/bleed easily.  Psychiatric/Behavioral: Negative for dysphoric mood. The patient is not nervous/anxious.      Patient Active Problem List  Diagnosis  . HYPERCHOLESTEROLEMIA, PURE  . OBESITY  . ALLERGIC RHINITIS  . HYPERGLYCEMIA  . Screen for STD (sexually transmitted disease)  . Elbow fracture, left  . Stress reaction   Past Medical History  Diagnosis Date  . Allergy   . Sickle cell trait   . Obesity   . Hyperglycemia   . Hyperlipidemia     triglyceriedes    Past Surgical History  Procedure Date  . Ankle surgery    History  Substance Use Topics  . Smoking status: Never Smoker   . Smokeless tobacco: Not on file  . Alcohol Use: Yes     Comment: Occasionally    Family History  Problem Relation Age of Onset  . Coronary artery disease Mother   . Heart attack Mother   . Hyperlipidemia Mother   . Alcohol abuse Father   . Hyperlipidemia Father   . Hypertension Father   . Hypertension Brother   . Cancer Maternal Grandmother     brain   No Known Allergies Current Outpatient Prescriptions on File Prior to Visit  Medication  Sig Dispense Refill  . fenofibrate 160 MG tablet TAKE 1 TABLET BY MOUTH EVERY DAY  30 tablet  0  . ibuprofen (ADVIL,MOTRIN) 200 MG tablet Take 400 mg by mouth every 6 (six) hours as needed.        . naproxen sodium (ALEVE) 220 MG tablet Take 220 mg by mouth as directed.              Objective:   Physical Exam  Constitutional: He appears well-developed and well-nourished. No distress.       obese and well appearing   HENT:  Head: Normocephalic and atraumatic.  Mouth/Throat: Oropharynx is clear and moist. No oropharyngeal exudate.  Eyes: Conjunctivae normal and EOM are normal. Pupils are equal, round, and reactive to light. No scleral icterus.  Neck: Normal range of motion. Neck supple. No JVD present. Carotid bruit is not present. No  thyromegaly present.  Cardiovascular: Normal rate, regular rhythm, normal heart sounds and intact distal pulses.  Exam reveals no gallop.   Pulmonary/Chest: Effort normal and breath sounds normal. No respiratory distress. He has no wheezes.  Abdominal: Soft. Bowel sounds are normal. He exhibits no distension, no abdominal bruit and no mass. There is no tenderness.  Musculoskeletal: He exhibits no edema.  Lymphadenopathy:    He has no cervical adenopathy.  Neurological: He is alert. He has normal reflexes. No cranial nerve deficit. He exhibits normal muscle tone. Coordination normal.  Skin: Skin is warm and dry. No rash noted. No erythema. No pallor.  Psychiatric: He has a normal mood and affect.          Assessment & Plan:

## 2012-05-26 ENCOUNTER — Encounter: Payer: Self-pay | Admitting: *Deleted

## 2012-08-22 ENCOUNTER — Other Ambulatory Visit: Payer: 59

## 2012-08-29 ENCOUNTER — Ambulatory Visit (INDEPENDENT_AMBULATORY_CARE_PROVIDER_SITE_OTHER): Payer: 59 | Admitting: Family Medicine

## 2012-08-29 ENCOUNTER — Encounter: Payer: Self-pay | Admitting: Family Medicine

## 2012-08-29 VITALS — BP 116/62 | HR 70 | Temp 97.9°F | Ht 70.0 in | Wt 237.5 lb

## 2012-08-29 DIAGNOSIS — E669 Obesity, unspecified: Secondary | ICD-10-CM

## 2012-08-29 DIAGNOSIS — E7800 Pure hypercholesterolemia, unspecified: Secondary | ICD-10-CM

## 2012-08-29 DIAGNOSIS — R7309 Other abnormal glucose: Secondary | ICD-10-CM

## 2012-08-29 DIAGNOSIS — M6281 Muscle weakness (generalized): Secondary | ICD-10-CM

## 2012-08-29 LAB — HEMOGLOBIN A1C: Hgb A1c MFr Bld: 4.9 % (ref 4.6–6.5)

## 2012-08-29 LAB — CK: Total CK: 262 U/L — ABNORMAL HIGH (ref 7–232)

## 2012-08-29 LAB — COMPREHENSIVE METABOLIC PANEL
ALT: 45 U/L (ref 0–53)
AST: 30 U/L (ref 0–37)
Albumin: 4.4 g/dL (ref 3.5–5.2)
Calcium: 9.5 mg/dL (ref 8.4–10.5)
Chloride: 102 mEq/L (ref 96–112)
Potassium: 3.8 mEq/L (ref 3.5–5.1)

## 2012-08-29 NOTE — Assessment & Plan Note (Signed)
Pt notices when lifting wts- which he just started - ? From med or deconditioning No pain and reassuring exam  Check cpk today and make plan from there

## 2012-08-29 NOTE — Patient Instructions (Addendum)
Keep working on weight loss  Keep up the exercise Labs today - lets see what your CPK looks like in light of your muscle complaints  Stay away from sugars and fats in diet

## 2012-08-29 NOTE — Progress Notes (Signed)
Subjective:    Patient ID: Andrew Burgess, male    DOB: 1970-08-20, 42 y.o.   MRN: [id number]  HPI Here for f/u of chronic health issues  Is doing well overall  Taking care of himself   Obesity Wt is down 4 lb with bmi of 34 Is eating better - is eating more grilled chicken and using and indoor grill  More salads  Stays away from fatty foods   Hyperlipidemia Due for check tx for triglycerides with fenofibrate  He thinks it makes him feel "weak" but no pain  Lab Results  Component Value Date   CHOL 223* 05/23/2012   HDL 29.60* 05/23/2012   LDLCALC See Comment mg/dL 07/03/1476   LDLDIRECT 29.5 05/23/2012   TRIG 446.0* 05/23/2012   CHOLHDL 8 05/23/2012   Exercising 3 days per week - elliptical and spin class- likes it  Is starting to lift weights    Hyperglycemia Controlled with diet  Lab Results  Component Value Date   HGBA1C 5.1 05/23/2012  avoids much sugar- not eating sweets  occ sugar in coffee     Patient Active Problem List  Diagnosis  . HYPERCHOLESTEROLEMIA, PURE  . OBESITY  . ALLERGIC RHINITIS  . HYPERGLYCEMIA  . Screen for STD (sexually transmitted disease)  . Stress reaction  . Muscle weakness (generalized)   Past Medical History  Diagnosis Date  . Allergy   . Sickle cell trait   . Obesity   . Hyperglycemia   . Hyperlipidemia     triglyceriedes    Past Surgical History  Procedure Laterality Date  . Ankle surgery     History  Substance Use Topics  . Smoking status: Never Smoker   . Smokeless tobacco: Not on file  . Alcohol Use: Yes     Comment: Occasionally    Family History  Problem Relation Age of Onset  . Coronary artery disease Mother   . Heart attack Mother   . Hyperlipidemia Mother   . Alcohol abuse Father   . Hyperlipidemia Father   . Hypertension Father   . Hypertension Brother   . Cancer Maternal Grandmother     brain   No Known Allergies Current Outpatient Prescriptions on File Prior to Visit  Medication Sig Dispense  Refill  . fenofibrate 160 MG tablet Take 1 tablet (160 mg total) by mouth daily.  30 tablet  11  . naproxen sodium (ALEVE) 220 MG tablet Take 220 mg by mouth as directed.         No current facility-administered medications on file prior to visit.    Review of Systems    Review of Systems  Constitutional: Negative for fever, appetite change, fatigue and unexpected weight change.  Eyes: Negative for pain and visual disturbance.  Respiratory: Negative for cough and shortness of breath.   Cardiovascular: Negative for cp or palpitations    Gastrointestinal: Negative for nausea, diarrhea and constipation.  Genitourinary: Negative for urgency and frequency.  Skin: Negative for pallor or rash   MSK pos for muscle weakness, neg for muscle pain or joint pain, neg for joint swelling or redness  Neurological: Negative for weakness, light-headedness, numbness and headaches.  Hematological: Negative for adenopathy. Does not bruise/bleed easily.  Psychiatric/Behavioral: Negative for dysphoric mood. The patient is not nervous/anxious.      Objective:   Physical Exam  Constitutional: He appears well-developed and well-nourished. No distress.  HENT:  Head: Normocephalic and atraumatic.  Mouth/Throat: Oropharynx is clear and moist.  Eyes: Conjunctivae and  EOM are normal. Pupils are equal, round, and reactive to light. No scleral icterus.  Neck: Normal range of motion. Neck supple. No JVD present. Carotid bruit is not present. No thyromegaly present.  Cardiovascular: Normal rate, regular rhythm, normal heart sounds and intact distal pulses.  Exam reveals no gallop.   Pulmonary/Chest: Effort normal and breath sounds normal. No respiratory distress. He has no wheezes. He exhibits no tenderness.  Abdominal: Soft. Bowel sounds are normal. He exhibits no abdominal bruit.  Musculoskeletal: He exhibits no edema and no tenderness.  No acute joint changes   Lymphadenopathy:    He has no cervical adenopathy.   Neurological: He is alert. He has normal reflexes. No cranial nerve deficit. He exhibits normal muscle tone. Coordination normal.  Skin: Skin is warm and dry. No rash noted. No erythema. No pallor.          Assessment & Plan:

## 2012-08-29 NOTE — Assessment & Plan Note (Signed)
Discussed how this problem influences overall health and the risks it imposes  Reviewed plan for weight loss with lower calorie diet (via better food choices and also portion control or program like weight watchers) and exercise building up to or more than 30 minutes 5 days per week including some aerobic activity   Commended on work so far! 

## 2012-08-29 NOTE — Assessment & Plan Note (Signed)
Hypertriglyceridemia- tx with fenofibrate- pt concerned ? Muscle weakness when lifting weights  Unsure if this could be deconditioning, also Check cpk today and adv further  Lipid today Diet and exercise better!

## 2012-08-29 NOTE — Assessment & Plan Note (Signed)
a1c today Suspect it will look good Enc to continue low sugar diet and wt loss

## 2012-10-12 ENCOUNTER — Telehealth: Payer: Self-pay

## 2012-10-12 NOTE — Telephone Encounter (Signed)
Pt stopped fenofibrate on 08/29/12 as instructed. Pt said does have less muscle weakness when lifts weights;pt feels stronger. Please advise. [facility] [address]

## 2012-10-12 NOTE — Telephone Encounter (Signed)
I'm glad he feels better Stay off of that medicine Watch diet carefully for all fats Schedule fasting lab in 1 month for cpk and lipids and then follow up about a week later to disc results thanks

## 2012-10-13 NOTE — Telephone Encounter (Signed)
Pt notified of Dr. Royden Purl comments and lab appt and f/u appt scheduled and letter mailed with appt times per pt request

## 2012-11-14 ENCOUNTER — Other Ambulatory Visit (INDEPENDENT_AMBULATORY_CARE_PROVIDER_SITE_OTHER): Payer: 59

## 2012-11-14 ENCOUNTER — Other Ambulatory Visit: Payer: 59

## 2012-11-14 ENCOUNTER — Ambulatory Visit: Payer: 59 | Admitting: Family Medicine

## 2012-11-14 DIAGNOSIS — M6281 Muscle weakness (generalized): Secondary | ICD-10-CM

## 2012-11-14 DIAGNOSIS — E7800 Pure hypercholesterolemia, unspecified: Secondary | ICD-10-CM

## 2012-11-14 LAB — CK: Total CK: 292 U/L — ABNORMAL HIGH (ref 7–232)

## 2012-11-14 LAB — LIPID PANEL
Cholesterol: 348 mg/dL — ABNORMAL HIGH (ref 0–200)
Triglycerides: 3704 mg/dL — ABNORMAL HIGH (ref 0.0–149.0)

## 2012-11-14 LAB — LDL CHOLESTEROL, DIRECT: Direct LDL: 65.1 mg/dL

## 2012-11-21 ENCOUNTER — Encounter: Payer: Self-pay | Admitting: Family Medicine

## 2012-11-21 ENCOUNTER — Ambulatory Visit (INDEPENDENT_AMBULATORY_CARE_PROVIDER_SITE_OTHER): Payer: 59 | Admitting: Family Medicine

## 2012-11-21 VITALS — BP 116/70 | HR 68 | Temp 98.2°F | Ht 70.0 in | Wt 243.5 lb

## 2012-11-21 DIAGNOSIS — E7800 Pure hypercholesterolemia, unspecified: Secondary | ICD-10-CM

## 2012-11-21 MED ORDER — FENOFIBRATE 160 MG PO TABS: 160.0000 mg | ORAL_TABLET | Freq: Every day | ORAL | Status: DC

## 2012-11-21 NOTE — Assessment & Plan Note (Signed)
When off fenofibrate triglycerides are over 300 (pt also stopped watching diet) Will re start this 160 mg  Also rev diet in detail Pt reports some muscle pain on this drug- cpk is 290s- even off of it  Will refer to lipid clinic for further eval  Recommended trial of co enzyme Q 10 in the meantime to see if this helps with symptoms

## 2012-11-21 NOTE — Progress Notes (Signed)
Subjective:    Patient ID: Andrew Burgess, male    DOB: 03/31/1971, 42 y.o.   MRN: [id number]  HPI Here for f/u of hypertyriglyceridemia   Lab Results  Component Value Date   CHOL 348* 11/14/2012   CHOL 252* 08/29/2012   CHOL 223* 05/23/2012   Lab Results  Component Value Date   HDL 16.50 Lipemic* 11/14/2012   HDL 30.00* 08/29/2012   HDL 29.60* 05/23/2012   Lab Results  Component Value Date   LDLCALC See Comment mg/dL 5/78/4696   Lab Results  Component Value Date   TRIG 3704.0 Lipemic* 11/14/2012   TRIG 557.0* 08/29/2012   TRIG 446.0* 05/23/2012   Lab Results  Component Value Date   CHOLHDL 21 11/14/2012   CHOLHDL 8 08/29/2012   CHOLHDL 8 05/23/2012   Lab Results  Component Value Date   LDLDIRECT 65.1 Repeated and verified X2. 11/14/2012   LDLDIRECT 80.0 08/29/2012   LDLDIRECT 98.1 05/23/2012    Off fenofibrate  Wt is up 6 lb with bmi of 34 Doing some cardio but not as much weight lifting    CPK is 292- up from the 260s   In the past lifting wts on the fenofibrate gave him muscle pain and also has soreness in shoulders driving Much better not on medicine   Parents and sibs have high cholesterol  And also high bp   Diet has not been very good - eating a lot of junk- schedule is hectic and he has eaten a lot of fast food    Patient Active Problem List   Diagnosis Date Noted  . Muscle weakness (generalized) 08/29/2012  . Stress reaction 01/19/2011  . Screen for STD (sexually transmitted disease) 01/12/2011  . OBESITY 11/14/2007  . ALLERGIC RHINITIS 11/11/2007  . HYPERGLYCEMIA 11/11/2007  . HYPERCHOLESTEROLEMIA, PURE 03/04/2007   Past Medical History  Diagnosis Date  . Allergy   . Sickle cell trait   . Obesity   . Hyperglycemia   . Hyperlipidemia     triglyceriedes    Past Surgical History  Procedure Laterality Date  . Ankle surgery     History  Substance Use Topics  . Smoking status: Never Smoker   . Smokeless tobacco: Not on file  . Alcohol Use: Yes      Comment: Occasionally    Family History  Problem Relation Age of Onset  . Coronary artery disease Mother   . Heart attack Mother   . Hyperlipidemia Mother   . Alcohol abuse Father   . Hyperlipidemia Father   . Hypertension Father   . Hypertension Brother   . Cancer Maternal Grandmother     brain   No Known Allergies Current Outpatient Prescriptions on File Prior to Visit  Medication Sig Dispense Refill  . fenofibrate 160 MG tablet Take 1 tablet (160 mg total) by mouth daily.  30 tablet  11  . naproxen sodium (ALEVE) 220 MG tablet Take 220 mg by mouth as directed.         No current facility-administered medications on file prior to visit.    Review of Systems    Review of Systems  Constitutional: Negative for fever, appetite change, fatigue and unexpected weight change.  Eyes: Negative for pain and visual disturbance.  Respiratory: Negative for cough and shortness of breath.   Cardiovascular: Negative for cp or palpitations    Gastrointestinal: Negative for nausea, diarrhea and constipation. neg for abdominal pain  Genitourinary: Negative for urgency and frequency.  Skin: Negative for  pallor or rash   Neurological: Negative for weakness, light-headedness, numbness and headaches.  Hematological: Negative for adenopathy. Does not bruise/bleed easily.  Psychiatric/Behavioral: Negative for dysphoric mood. The patient is not nervous/anxious.      Objective:   Physical Exam  Constitutional: He appears well-developed and well-nourished. No distress.  obese and well appearing   HENT:  Head: Normocephalic and atraumatic.  Mouth/Throat: Oropharynx is clear and moist.  Eyes: Conjunctivae and EOM are normal. Pupils are equal, round, and reactive to light. No scleral icterus.  Neck: Normal range of motion. Neck supple.  Cardiovascular: Normal rate, regular rhythm, normal heart sounds and intact distal pulses.  Exam reveals no gallop.   Pulmonary/Chest: Breath sounds normal.   Abdominal: Soft. Bowel sounds are normal.  Musculoskeletal: He exhibits no edema and no tenderness.  Lymphadenopathy:    He has no cervical adenopathy.  Neurological: He is alert.  Skin: Skin is warm and dry.  Psychiatric: He has a normal mood and affect.          Assessment & Plan:

## 2012-11-21 NOTE — Patient Instructions (Addendum)
Start back on the fenofibrate You can try coenzyme Q 10 to see if it helps with muscle pain  Try to eat a low fat diet  We will do a referral to our lipid clinic in [place] at check out

## 2012-11-25 ENCOUNTER — Ambulatory Visit (INDEPENDENT_AMBULATORY_CARE_PROVIDER_SITE_OTHER): Payer: 59 | Admitting: Pharmacist

## 2012-11-25 DIAGNOSIS — E781 Pure hyperglyceridemia: Secondary | ICD-10-CM

## 2012-11-25 MED ORDER — OMEGA-3-ACID ETHYL ESTERS 1 G PO CAPS: 1.0000 g | ORAL_CAPSULE | Freq: Two times a day (BID) | ORAL | Status: DC

## 2012-11-25 NOTE — Patient Instructions (Addendum)
Continue fenofibrate 160mg  once daily  Start fish oil 2gm daily  Diet: try to make healthier choices at fast food restaurants and cut down on peanut butter sandwiches  Continue to work out 3 days of the week.   Recheck labs in 6 weeks.

## 2012-11-25 NOTE — Progress Notes (Signed)
HPI  Mr. Branch is a 42 yo M referred by Dr. Milinda Antis for hypertryglyceridemia.  He was recently seen by Dr. Milinda Antis and labs showed TG >3000.  He has a history of hypertryglyceridemia and has been treated with fenofibrate in the past.  When labwork was done, he was not on any treatment.  He had stopped the fenofibrate because he was having arm pain.  CK was elevated while on fenofibrate and at baseline so unsure if pain related to medication or other issue.  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   Past Medical History  Diagnosis Date  . Allergy   . Sickle cell trait   . Obesity   . Hyperglycemia   . Hyperlipidemia     triglyceriedes   . Hypertriglyceridemia     Family history significant for his father who had a stroke in his 84s and mother with a history of PVD/CAD starting in her 42s.    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  He likes to carry 3 peanut butter and jelly sandwiches on the road with him each night.  He also eats a lot of fast foods- [employer], [place], [place].  He seems to get whatever is on special at that time- i.e. 2 whoppers for $5 and eat them both.  He does try to cook healthier when at home.  He likes grilled chicken, hambeurgers, salmon, corn, green beans, and fruit.  He avoids soft drinks but likes OJ, fruit punch, water and coffee.  He is a social drinker.   Exercise Pt reports going to the [place] when he gets off his route and doing a spin class for ~45 min and then going to the sauna.  He does this about 3 days of the week.

## 2012-11-28 NOTE — Assessment & Plan Note (Signed)
Pt's TG severely elevated.  TC- 348, TG- 3704, HDL- 16.5, LDL- 65.  LFTs are WNL.  Pt was not on any medication when labs drawn.  History of TG in the 300-500 range when on fenofibrate.  This has already been restarted.  He does not report any muscle pains with it as of now.  If he does start complaining of these, will consider switching to Niaspan.  Will add fish oil 2gm daily to help decrease TG.  Suggested improvements in diet- especially making better choices at fast food restaurants.  Was given a eating out book to help with nutritional facts. Will follow up in 6 weeks.

## 2013-01-02 ENCOUNTER — Ambulatory Visit (INDEPENDENT_AMBULATORY_CARE_PROVIDER_SITE_OTHER): Payer: 59

## 2013-01-02 DIAGNOSIS — E781 Pure hyperglyceridemia: Secondary | ICD-10-CM

## 2013-01-09 ENCOUNTER — Ambulatory Visit: Payer: 59 | Admitting: Pharmacist

## 2013-01-09 LAB — HEPATIC FUNCTION PANEL
ALT: 41 U/L (ref 0–53)
AST: 29 U/L (ref 0–37)
Total Bilirubin: 0.4 mg/dL (ref 0.3–1.2)
Total Protein: 7.6 g/dL (ref 6.0–8.3)

## 2013-01-09 LAB — LIPID PANEL
HDL: 34.6 mg/dL — ABNORMAL LOW (ref >39.00)
Triglycerides: 456 mg/dL — ABNORMAL HIGH (ref 0.0–149.0)

## 2013-01-09 LAB — LDL CHOLESTEROL, DIRECT: Direct LDL: 85.5 mg/dL

## 2013-01-23 ENCOUNTER — Ambulatory Visit (INDEPENDENT_AMBULATORY_CARE_PROVIDER_SITE_OTHER): Payer: 59 | Admitting: Pharmacist

## 2013-01-23 VITALS — Wt 244.0 lb

## 2013-01-23 DIAGNOSIS — E781 Pure hyperglyceridemia: Secondary | ICD-10-CM

## 2013-01-23 NOTE — Progress Notes (Signed)
HPI  [name] is a 42 yo M referred by Dr. Milinda Burgess for hypertryglyceridemia.  He is seen today for follow up visit.  At last visit, we restarted fenofibrate and added fish oil 2 gm daily.  Pt states he has been compliant with medications and does not report any muscle pains.  States he does have a slight aftertaste with fish oil but this resolves within 30 minutes.  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   Past Medical History  Diagnosis Date  . Allergy   . Sickle cell trait   . Obesity   . Hyperglycemia   . Hyperlipidemia     triglyceriedes   . Hypertriglyceridemia     Family history significant for his father who had a stroke in his 69s and mother with a history of PVD/CAD starting in her 11s.    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  He has tried to make some changes including eating less fast food while on the road and carrying carrots as a snack.  He states he has increased his salad intake and decreased breads.    Exercise Pt has not done any exercise since last visit.  He states he is motivated to get back into the gym now.  He has been a little more fatigued this summer due to his work schedule.

## 2013-01-23 NOTE — Patient Instructions (Signed)
Your labs look much better today.  Continue your current medications   Continue to make good choices with your diet and limit fast food.  Try to exercise at least 150 minutes per week.    Recheck labs in 3 months.

## 2013-01-23 NOTE — Assessment & Plan Note (Signed)
Pt's TG greatly improved on medication.  TC- 239, TG- 456, HDL- 35, LDL- 86.  LFTs are WNL.  Will continue current medication.  Pt seems motivated to continue to make lifestyle changes; especially with exercise.  Will recheck labs in 3 months.  If TG remain elevated, will consider increasing fish oil to 4gm per day.

## 2013-04-24 ENCOUNTER — Other Ambulatory Visit (INDEPENDENT_AMBULATORY_CARE_PROVIDER_SITE_OTHER): Payer: 59

## 2013-04-24 DIAGNOSIS — E781 Pure hyperglyceridemia: Secondary | ICD-10-CM

## 2013-04-24 LAB — HEPATIC FUNCTION PANEL
AST: 26 U/L (ref 0–37)
Albumin: 4.1 g/dL (ref 3.5–5.2)
Alkaline Phosphatase: 42 U/L (ref 39–117)
Total Protein: 7.3 g/dL (ref 6.0–8.3)

## 2013-04-24 LAB — LIPID PANEL
Cholesterol: 210 mg/dL — ABNORMAL HIGH (ref 0–200)
Triglycerides: 335 mg/dL — ABNORMAL HIGH (ref 0.0–149.0)

## 2013-04-24 LAB — LDL CHOLESTEROL, DIRECT: Direct LDL: 92.6 mg/dL

## 2013-05-01 ENCOUNTER — Ambulatory Visit: Payer: 59 | Admitting: Pharmacist

## 2013-08-17 ENCOUNTER — Other Ambulatory Visit: Payer: Self-pay | Admitting: Family Medicine

## 2013-08-18 NOTE — Telephone Encounter (Signed)
done

## 2013-08-18 NOTE — Telephone Encounter (Signed)
Electronic refill request, no recent/future appt., please advise  

## 2013-08-18 NOTE — Telephone Encounter (Signed)
Please refill for 6 months, thanks

## 2013-12-05 ENCOUNTER — Other Ambulatory Visit: Payer: Self-pay | Admitting: Family Medicine

## 2013-12-06 NOTE — Telephone Encounter (Signed)
Pt has not been seen in over a yr. so scheduled a medication refill appt. and refilled medication x64month

## 2013-12-20 ENCOUNTER — Other Ambulatory Visit: Payer: Self-pay | Admitting: Family Medicine

## 2014-01-08 ENCOUNTER — Encounter: Payer: Self-pay | Admitting: Family Medicine

## 2014-01-08 ENCOUNTER — Ambulatory Visit (INDEPENDENT_AMBULATORY_CARE_PROVIDER_SITE_OTHER): Payer: 59 | Admitting: Family Medicine

## 2014-01-08 VITALS — BP 118/68 | HR 65 | Temp 98.5°F | Ht 70.0 in | Wt 246.8 lb

## 2014-01-08 DIAGNOSIS — E669 Obesity, unspecified: Secondary | ICD-10-CM

## 2014-01-08 DIAGNOSIS — R7309 Other abnormal glucose: Secondary | ICD-10-CM

## 2014-01-08 DIAGNOSIS — E781 Pure hyperglyceridemia: Secondary | ICD-10-CM

## 2014-01-08 LAB — LIPID PANEL
CHOLESTEROL: 204 mg/dL — AB (ref 0–200)
HDL: 26.2 mg/dL — ABNORMAL LOW (ref >39.00)
NonHDL: 177.8
Total CHOL/HDL Ratio: 8
Triglycerides: 880 mg/dL — ABNORMAL HIGH (ref 0.0–149.0)
VLDL: 176 mg/dL — ABNORMAL HIGH (ref 0.0–40.0)

## 2014-01-08 LAB — COMPREHENSIVE METABOLIC PANEL
ALBUMIN: 4 g/dL (ref 3.5–5.2)
ALT: 30 U/L (ref 0–53)
AST: 24 U/L (ref 0–37)
Alkaline Phosphatase: 59 U/L (ref 39–117)
BUN: 17 mg/dL (ref 6–23)
CHLORIDE: 109 meq/L (ref 96–112)
CO2: 24 meq/L (ref 19–32)
Calcium: 9.1 mg/dL (ref 8.4–10.5)
Creatinine, Ser: 1.4 mg/dL (ref 0.4–1.5)
GFR: 73.52 mL/min (ref >60.00)
GLUCOSE: 152 mg/dL — AB (ref 70–99)
POTASSIUM: 4.3 meq/L (ref 3.5–5.1)
SODIUM: 138 meq/L (ref 135–145)
TOTAL PROTEIN: 7 g/dL (ref 6.0–8.3)
Total Bilirubin: 0.4 mg/dL (ref 0.2–1.2)

## 2014-01-08 LAB — LDL CHOLESTEROL, DIRECT: Direct LDL: 66.5 mg/dL

## 2014-01-08 MED ORDER — FENOFIBRATE 160 MG PO TABS: ORAL_TABLET | ORAL | Status: DC

## 2014-01-08 MED ORDER — OMEGA-3-ACID ETHYL ESTERS 1 G PO CAPS: ORAL_CAPSULE | ORAL | Status: DC

## 2014-01-08 NOTE — Assessment & Plan Note (Signed)
cmet with glucose today  Last a1c was ok  Disc low sugar diet

## 2014-01-08 NOTE — Progress Notes (Signed)
Pre visit review using our clinic review tool, if applicable. No additional management support is needed unless otherwise documented below in the visit note. 

## 2014-01-08 NOTE — Progress Notes (Signed)
Subjective:    Patient ID: Andrew Burgess, male    DOB: 1971-05-12, 43 y.o.   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   Has been feeling pretty well   Hypertriglyceridemia On fenofibrate and also lovaza for HDL  (1 g bid) Went to lipid clinic for a while  Had a hard time tolerating the fenofibrate - with muscle pain in the past   Lab Results  Component Value Date   CHOL 210* 04/24/2013   HDL 33.70* 04/24/2013   LDLCALC See Comment mg/dL 09/06/2009   LDLDIRECT 92.6 04/24/2013   TRIG 335.0* 04/24/2013   CHOLHDL 6 04/24/2013    Lab Results  Component Value Date   CKTOTAL 292 Repeated and verified X2.* 11/14/2012   this is fairly baseline for him  He is still taking both meds   Lab Results  Component Value Date   HGBA1C 4.9 08/29/2012    Wt is up 2 lb with bmi of 33  Has done "pretty good" with watching diet  Has decreased red meat- goal is to stop it all together - eats it 2-3 times per week  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 Muscle weakness (generalized) 08/29/2012  . Stress reaction 01/19/2011  . Screen for STD (sexually transmitted disease) 01/12/2011  . OBESITY 11/14/2007  . ALLERGIC RHINITIS 11/11/2007  . HYPERGLYCEMIA 11/11/2007  . Hypertriglyceridemia 03/04/2007   Past Medical History  Diagnosis Date  . Allergy   . Sickle cell trait   . Obesity   . Hyperglycemia   . Hyperlipidemia     triglyceriedes   . Hypertriglyceridemia    Past Surgical History  Procedure Laterality Date  . Ankle surgery     History  Substance Use Topics  . Smoking status: Never Smoker   . Smokeless tobacco: Not on  file  . Alcohol Use: Yes     Comment: Occasionally    Family History  Problem Relation Age of Onset  . Coronary artery disease Mother   . Heart attack Mother   . Hyperlipidemia Mother   . Alcohol abuse Father   . Hyperlipidemia Father   . Hypertension Father   . Hypertension Brother   . Cancer Maternal Grandmother     brain   No Known Allergies Current Outpatient Prescriptions on File Prior to Visit  Medication Sig Dispense Refill  . naproxen sodium (ALEVE) 220 MG tablet Take 220 mg by mouth as directed.         No current facility-administered medications on file prior to visit.    Review of Systems    Review of Systems  Constitutional: Negative for fever, appetite change,  and unexpected weight change. pos for fatigue from lack of sleep and self care  Eyes: Negative for pain and visual disturbance.  Respiratory: Negative for cough and shortness of breath.   Cardiovascular: Negative for cp or palpitations    Gastrointestinal: Negative for nausea, diarrhea and constipation.  Genitourinary: Negative for urgency and frequency.  Skin: Negative  for pallor or rash   Neurological: Negative for weakness, light-headedness, numbness and headaches.  Hematological: Negative for adenopathy. Does not bruise/bleed easily.  Psychiatric/Behavioral: Negative for dysphoric mood. The patient is not nervous/anxious.      Objective:   Physical Exam  Constitutional: He appears well-developed and well-nourished. No distress.  obese and well appearing   HENT:  Head: Normocephalic and atraumatic.  Mouth/Throat: Oropharynx is clear and moist.  Eyes: Conjunctivae and EOM are normal. Pupils are equal, round, and reactive to light. Left eye exhibits no discharge. No scleral icterus.  Neck: Normal range of motion. Neck supple. No JVD present. Carotid bruit is not present. No thyromegaly present.  Cardiovascular: Normal rate, regular rhythm, normal heart sounds and intact distal pulses.  Exam reveals  no gallop.   Pulmonary/Chest: Effort normal and breath sounds normal. No respiratory distress. He has no wheezes. He exhibits no tenderness.  Abdominal: Soft. Bowel sounds are normal. He exhibits no distension, no abdominal bruit and no mass. There is no tenderness.  Musculoskeletal: He exhibits no edema and no tenderness.  Lymphadenopathy:    He has no cervical adenopathy.  Neurological: He is alert. He has normal reflexes. No cranial nerve deficit. He exhibits normal muscle tone. Coordination normal.  Skin: Skin is warm and dry. No rash noted. No erythema. No pallor.  Psychiatric: He has a normal mood and affect.          Assessment & Plan:   Problem List Items Addressed This Visit     Other   Hypertriglyceridemia - Primary     Lab today  Tolerating current dose of fenofibrate without as much muscle pain (rev lipid clinic notes) On lovaza for HDL - if no improvement may stop it - very $$ for pt  Disc exercise -unable given work schedule Disc diet -working on stopping red meat     Relevant Medications      omega-3 acid ethyl esters (LOVAZA) 1 G capsule      fenofibrate tablet   Other Relevant Orders      Comprehensive metabolic panel (Completed)      Lipid panel (Completed)   OBESITY     Discussed how this problem influences overall health and the risks it imposes  Reviewed plan for weight loss with lower calorie diet (via better food choices and also portion control or program like weight watchers) and exercise building up to or more than 30 minutes 5 days per week including some aerobic activity   His work schedule does not allow time for self care but pt states he will work on diet     HYPERGLYCEMIA     cmet with glucose today  Last a1c was ok  Disc low sugar diet

## 2014-01-08 NOTE — Assessment & Plan Note (Signed)
Lab today  Tolerating current dose of fenofibrate without as much muscle pain (rev lipid clinic notes) On lovaza for HDL - if no improvement may stop it - very $$ for pt  Disc exercise -unable given work schedule Disc diet -working on stopping red meat

## 2014-01-08 NOTE — Assessment & Plan Note (Signed)
Discussed how this problem influences overall health and the risks it imposes  Reviewed plan for weight loss with lower calorie diet (via better food choices and also portion control or program like weight watchers) and exercise building up to or more than 30 minutes 5 days per week including some aerobic activity   His work schedule does not allow time for self care but pt states he will work on diet

## 2014-01-08 NOTE — Patient Instructions (Signed)
Keep working on [employer] - try to eliminate red meat and eat grilled chicken or salads when on the road  Continue current medicines  Labs today  Get back to exercise when schedule allows

## 2014-01-11 ENCOUNTER — Encounter: Payer: Self-pay | Admitting: *Deleted

## 2014-02-12 ENCOUNTER — Other Ambulatory Visit (INDEPENDENT_AMBULATORY_CARE_PROVIDER_SITE_OTHER): Payer: 59

## 2014-02-12 DIAGNOSIS — E781 Pure hyperglyceridemia: Secondary | ICD-10-CM

## 2014-02-12 DIAGNOSIS — R7989 Other specified abnormal findings of blood chemistry: Secondary | ICD-10-CM

## 2014-02-12 LAB — LIPID PANEL
Cholesterol: 210 mg/dL — ABNORMAL HIGH (ref 0–200)
HDL: 23.2 mg/dL — AB (ref >39.00)
NonHDL: 186.8
Total CHOL/HDL Ratio: 9
Triglycerides: 448 mg/dL — ABNORMAL HIGH (ref 0.0–149.0)
VLDL: 89.6 mg/dL — ABNORMAL HIGH (ref 0.0–40.0)

## 2014-02-12 LAB — LDL CHOLESTEROL, DIRECT: Direct LDL: 93.1 mg/dL

## 2014-02-13 ENCOUNTER — Encounter: Payer: Self-pay | Admitting: *Deleted

## 2015-01-16 ENCOUNTER — Other Ambulatory Visit: Payer: Self-pay | Admitting: Family Medicine

## 2015-01-16 NOTE — Telephone Encounter (Signed)
appt scheduled and med refilled 

## 2015-01-16 NOTE — Telephone Encounter (Signed)
No recent/future appt., pt hasn't been seen in over a year, please advise

## 2015-01-16 NOTE — Telephone Encounter (Signed)
Please schedule fall f/u and refill until then  

## 2015-02-12 ENCOUNTER — Other Ambulatory Visit: Payer: Self-pay | Admitting: Family Medicine

## 2015-02-25 ENCOUNTER — Encounter: Payer: Self-pay | Admitting: Family Medicine

## 2015-02-25 ENCOUNTER — Ambulatory Visit (INDEPENDENT_AMBULATORY_CARE_PROVIDER_SITE_OTHER): Payer: 59 | Admitting: Family Medicine

## 2015-02-25 VITALS — BP 125/80 | HR 84 | Temp 98.3°F | Ht 70.0 in | Wt 246.0 lb

## 2015-02-25 DIAGNOSIS — E669 Obesity, unspecified: Secondary | ICD-10-CM

## 2015-02-25 DIAGNOSIS — R739 Hyperglycemia, unspecified: Secondary | ICD-10-CM | POA: Diagnosis not present

## 2015-02-25 DIAGNOSIS — E781 Pure hyperglyceridemia: Secondary | ICD-10-CM | POA: Diagnosis not present

## 2015-02-25 LAB — COMPREHENSIVE METABOLIC PANEL
ALK PHOS: 65 U/L (ref 39–117)
ALT: 36 U/L (ref 0–53)
AST: 26 U/L (ref 0–37)
Albumin: 4.4 g/dL (ref 3.5–5.2)
BILIRUBIN TOTAL: 0.3 mg/dL (ref 0.2–1.2)
BUN: 21 mg/dL (ref 6–23)
CALCIUM: 9.3 mg/dL (ref 8.4–10.5)
CO2: 25 mEq/L (ref 19–32)
CREATININE: 1.38 mg/dL (ref 0.40–1.50)
Chloride: 105 mEq/L (ref 96–112)
GFR: 71.32 mL/min (ref >60.00)
Glucose, Bld: 167 mg/dL — ABNORMAL HIGH (ref 70–99)
Potassium: 4.1 mEq/L (ref 3.5–5.1)
Sodium: 138 mEq/L (ref 135–145)
TOTAL PROTEIN: 7.3 g/dL (ref 6.0–8.3)

## 2015-02-25 LAB — HEMOGLOBIN A1C: Hgb A1c MFr Bld: 5 % (ref 4.6–6.5)

## 2015-02-25 LAB — LDL CHOLESTEROL, DIRECT: Direct LDL: 57 mg/dL

## 2015-02-25 LAB — LIPID PANEL
CHOLESTEROL: 246 mg/dL — AB (ref 0–200)
HDL: 20.8 mg/dL — ABNORMAL LOW (ref >39.00)
Total CHOL/HDL Ratio: 12
Triglycerides: 1251 mg/dL — ABNORMAL HIGH (ref 0.0–149.0)

## 2015-02-25 MED ORDER — OMEGA-3-ACID ETHYL ESTERS 1 G PO CAPS: 1.0000 | ORAL_CAPSULE | Freq: Two times a day (BID) | ORAL | Status: DC

## 2015-02-25 MED ORDER — FENOFIBRATE 160 MG PO TABS: 160.0000 mg | ORAL_TABLET | Freq: Every day | ORAL | Status: DC

## 2015-02-25 NOTE — Progress Notes (Signed)
Pre visit review using our clinic review tool, if applicable. No additional management support is needed unless otherwise documented below in the visit note. 

## 2015-02-25 NOTE — Assessment & Plan Note (Signed)
Due for A1C Enc wt loss and low glycemic eating to prevent DM Lab today

## 2015-02-25 NOTE — Assessment & Plan Note (Signed)
Was briefly of fibrate and lovaza -now back on for a week Fair eating Disc goals for lipids and reasons to control them Lab today Rev low sat fat diet in detail

## 2015-02-25 NOTE — Assessment & Plan Note (Signed)
Discussed how this problem influences overall health and the risks it imposes  Reviewed plan for weight loss with lower calorie diet (via better food choices and also portion control or program like weight watchers) and exercise building up to or more than 30 minutes 5 days per week including some aerobic activity   Pt is motivated Enc to keep up exercise  Decrease portions

## 2015-02-25 NOTE — Progress Notes (Signed)
Subjective:    Patient ID: Andrew Burgess, male    DOB: October 09, 1970, 44 y.o.   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 Muscle weakness (generalized) 08/29/2012  . Stress reaction 01/19/2011  . Screen for STD (sexually transmitted disease) 01/12/2011  . Obesity 11/14/2007  . ALLERGIC RHINITIS 11/11/2007  . Hyperglycemia 11/11/2007  . Hypertriglyceridemia 03/04/2007   Past Medical History  Diagnosis Date  . Allergy   . Sickle cell trait   . Obesity   . Hyperglycemia   . Hyperlipidemia     triglyceriedes   . Hypertriglyceridemia    Past Surgical History  Procedure Laterality Date  . Ankle surgery     Social History  Substance Use Topics  . Smoking status: Never Smoker   . Smokeless tobacco: None  . Alcohol Use: 0.0 oz/week    0 Standard drinks or equivalent per week     Comment: Occasionally    Family History  Problem Relation Age of Onset  . Coronary artery disease Mother   . Heart attack Mother   . Hyperlipidemia Mother   . Alcohol abuse Father   . Hyperlipidemia Father   . Hypertension Father   . Hypertension Brother   . Cancer Maternal Grandmother     brain   No Known Allergies Current Outpatient Prescriptions on File  Prior to Visit  Medication Sig Dispense Refill  . fenofibrate 160 MG tablet TAKE 1 TABLET BY MOUTH EVERY DAY 30 tablet 1  . naproxen sodium (ALEVE) 220 MG tablet Take 220 mg by mouth as directed.      [name] omega-3 acid ethyl esters (LOVAZA) 1 G capsule TAKE ONE CAPSULE BY MOUTH TWICE A DAY 60 capsule 1   No current facility-administered medications on file prior to visit.      Review of Systems Review of Systems  Constitutional: Negative for fever, appetite change, and unexpected weight change. pos for fatigue from a difficult schedule  Eyes: Negative for pain and visual disturbance.  Respiratory: Negative for cough and shortness of breath.   Cardiovascular: Negative for cp or palpitations    Gastrointestinal: Negative for nausea, diarrhea and constipation.  Genitourinary: Negative for urgency and frequency.  Skin: Negative for pallor or rash   Neurological: Negative for weakness, light-headedness, numbness and headaches.  Hematological: Negative for adenopathy. Does not bruise/bleed easily.  Psychiatric/Behavioral: Negative for dysphoric mood. The patient is not nervous/anxious.         Objective:   Physical Exam  Constitutional: He appears well-developed and well-nourished. No distress.  obese and well appearing   HENT:  Head: Normocephalic and atraumatic.  Mouth/Throat: Oropharynx is clear and moist.  Eyes: Conjunctivae and EOM are normal. Pupils are equal, round, and reactive to light.  Neck: Normal range of motion. Neck supple. No JVD present. Carotid bruit is not present. No thyromegaly present.  Cardiovascular: Normal rate, regular rhythm, normal heart sounds and intact distal pulses.  Exam reveals no gallop.   Pulmonary/Chest: Effort normal and breath sounds normal. No respiratory distress. He has no wheezes. He has no rales.  No crackles  Abdominal: Soft. Bowel sounds are normal. He exhibits no distension, no abdominal bruit and no mass. There is no tenderness.    Musculoskeletal: He exhibits no edema.  Lymphadenopathy:    He has no cervical adenopathy.  Neurological: He is alert. He has normal reflexes.  Skin: Skin is warm and dry. No rash noted.  Psychiatric: He has a normal mood and affect.          Assessment & Plan:   Problem List Items Addressed This Visit      Other   Hyperglycemia - Primary    Due for A1C Enc wt loss and low glycemic eating to prevent DM Lab today      Relevant Orders   Comprehensive metabolic panel (Completed)   Hemoglobin A1c (Completed)   Hypertriglyceridemia    Was briefly of fibrate and lovaza -now back on for a week Fair eating Disc goals for lipids and reasons to control them Lab today Rev low sat fat diet in detail       Relevant Medications   omega-3 acid ethyl esters (LOVAZA) 1 G capsule   fenofibrate 160 MG tablet   Other Relevant Orders   Comprehensive metabolic panel (Completed)   Lipid panel (Completed)   Obesity    Discussed how this problem influences overall health and the risks it imposes  Reviewed plan for weight loss with lower calorie diet (via better food choices and also portion control or program like weight watchers) and exercise building up to or more than 30 minutes 5 days per week including some aerobic activity   Pt is motivated Enc to keep up exercise  Decrease portions

## 2015-02-25 NOTE — Patient Instructions (Signed)
Take care of yourself  Cut portions by 1/3 for weight loss  Keep up the exercise  Do the best you can to care for yourself  Drink lots of water   Labs today

## 2015-02-26 ENCOUNTER — Encounter: Payer: Self-pay | Admitting: *Deleted

## 2015-06-16 DIAGNOSIS — I219 Acute myocardial infarction, unspecified: Secondary | ICD-10-CM

## 2015-06-16 HISTORY — DX: Acute myocardial infarction, unspecified: I21.9

## 2016-01-18 ENCOUNTER — Emergency Department (HOSPITAL_COMMUNITY): Payer: 59

## 2016-01-18 ENCOUNTER — Inpatient Hospital Stay (HOSPITAL_COMMUNITY)
Admission: EM | Admit: 2016-01-18 | Discharge: 2016-01-23 | DRG: 247 | Disposition: A | Discharge status: Home / Self Care | Payer: 59 | Attending: Interventional Cardiology | Admitting: Interventional Cardiology

## 2016-01-18 ENCOUNTER — Encounter (HOSPITAL_COMMUNITY): Payer: Self-pay | Admitting: Emergency Medicine

## 2016-01-18 ENCOUNTER — Encounter (HOSPITAL_COMMUNITY)
Admission: EM | Disposition: A | Discharge status: Home / Self Care | Payer: Self-pay | Source: Home / Self Care | Attending: Interventional Cardiology

## 2016-01-18 DIAGNOSIS — Z955 Presence of coronary angioplasty implant and graft: Secondary | ICD-10-CM | POA: Diagnosis not present

## 2016-01-18 DIAGNOSIS — Z87891 Personal history of nicotine dependence: Secondary | ICD-10-CM | POA: Diagnosis not present

## 2016-01-18 DIAGNOSIS — E11319 Type 2 diabetes mellitus with unspecified diabetic retinopathy without macular edema: Secondary | ICD-10-CM

## 2016-01-18 DIAGNOSIS — I214 Non-ST elevation (NSTEMI) myocardial infarction: Secondary | ICD-10-CM | POA: Diagnosis present

## 2016-01-18 DIAGNOSIS — E1169 Type 2 diabetes mellitus with other specified complication: Secondary | ICD-10-CM | POA: Diagnosis present

## 2016-01-18 DIAGNOSIS — I25110 Atherosclerotic heart disease of native coronary artery with unstable angina pectoris: Secondary | ICD-10-CM | POA: Diagnosis present

## 2016-01-18 DIAGNOSIS — R7989 Other specified abnormal findings of blood chemistry: Secondary | ICD-10-CM | POA: Diagnosis not present

## 2016-01-18 DIAGNOSIS — D573 Sickle-cell trait: Secondary | ICD-10-CM | POA: Diagnosis present

## 2016-01-18 DIAGNOSIS — Z9861 Coronary angioplasty status: Secondary | ICD-10-CM

## 2016-01-18 DIAGNOSIS — R0600 Dyspnea, unspecified: Secondary | ICD-10-CM | POA: Diagnosis not present

## 2016-01-18 DIAGNOSIS — E1121 Type 2 diabetes mellitus with diabetic nephropathy: Secondary | ICD-10-CM

## 2016-01-18 DIAGNOSIS — I2510 Atherosclerotic heart disease of native coronary artery without angina pectoris: Secondary | ICD-10-CM | POA: Diagnosis not present

## 2016-01-18 DIAGNOSIS — E785 Hyperlipidemia, unspecified: Secondary | ICD-10-CM | POA: Diagnosis not present

## 2016-01-18 DIAGNOSIS — E119 Type 2 diabetes mellitus without complications: Secondary | ICD-10-CM

## 2016-01-18 DIAGNOSIS — E669 Obesity, unspecified: Secondary | ICD-10-CM | POA: Diagnosis not present

## 2016-01-18 DIAGNOSIS — R079 Chest pain, unspecified: Secondary | ICD-10-CM | POA: Diagnosis not present

## 2016-01-18 DIAGNOSIS — N189 Chronic kidney disease, unspecified: Secondary | ICD-10-CM | POA: Diagnosis not present

## 2016-01-18 DIAGNOSIS — E781 Pure hyperglyceridemia: Secondary | ICD-10-CM | POA: Diagnosis present

## 2016-01-18 DIAGNOSIS — R072 Precordial pain: Secondary | ICD-10-CM

## 2016-01-18 DIAGNOSIS — I2119 ST elevation (STEMI) myocardial infarction involving other coronary artery of inferior wall: Principal | ICD-10-CM | POA: Diagnosis present

## 2016-01-18 DIAGNOSIS — E1122 Type 2 diabetes mellitus with diabetic chronic kidney disease: Secondary | ICD-10-CM | POA: Diagnosis not present

## 2016-01-18 DIAGNOSIS — I200 Unstable angina: Secondary | ICD-10-CM | POA: Diagnosis not present

## 2016-01-18 HISTORY — DX: Type 2 diabetes mellitus without complications: E11.9

## 2016-01-18 HISTORY — DX: Atherosclerotic heart disease of native coronary artery without angina pectoris: I25.10

## 2016-01-18 HISTORY — PX: CARDIAC CATHETERIZATION: SHX172

## 2016-01-18 LAB — CK TOTAL AND CKMB (NOT AT ARMC)
CK TOTAL: 937 U/L — AB (ref 49–397)
CK, MB: 127.6 ng/mL — AB (ref 0.5–5.0)
CK, MB: 87.4 ng/mL — AB (ref 0.5–5.0)
RELATIVE INDEX: 13.6 — AB (ref 0.0–2.5)
RELATIVE INDEX: 11.5 — AB (ref 0.0–2.5)
Total CK: 763 U/L — ABNORMAL HIGH (ref 49–397)

## 2016-01-18 LAB — BASIC METABOLIC PANEL
Anion gap: 8 (ref 5–15)
BUN: 14 mg/dL (ref 6–20)
CALCIUM: 9.6 mg/dL (ref 8.9–10.3)
CO2: 21 mmol/L — ABNORMAL LOW (ref 22–32)
CREATININE: 1.4 mg/dL — AB (ref 0.61–1.24)
Chloride: 109 mmol/L (ref 101–111)
GFR, EST NON AFRICAN AMERICAN: 59 mL/min — AB (ref >60)
Glucose, Bld: 210 mg/dL — ABNORMAL HIGH (ref 65–99)
Potassium: 3.9 mmol/L (ref 3.5–5.1)
SODIUM: 138 mmol/L (ref 135–145)

## 2016-01-18 LAB — I-STAT CG4 LACTIC ACID, ED: Lactic Acid, Venous: 1.45 mmol/L (ref 0.5–1.9)

## 2016-01-18 LAB — I-STAT TROPONIN, ED: TROPONIN I, POC: 1.1 ng/mL — AB (ref 0.00–0.08)

## 2016-01-18 LAB — ECHOCARDIOGRAM COMPLETE
Height: 72 in
Weight: 3840 oz

## 2016-01-18 LAB — CBC WITH DIFFERENTIAL/PLATELET
Basophils Absolute: 0 10*3/uL (ref 0.0–0.1)
Basophils Relative: 0 %
EOS ABS: 0.1 10*3/uL (ref 0.0–0.7)
EOS PCT: 2 %
HCT: 38.9 % — ABNORMAL LOW (ref 39.0–52.0)
HEMOGLOBIN: 13.1 g/dL (ref 13.0–17.0)
LYMPHS ABS: 2.5 10*3/uL (ref 0.7–4.0)
Lymphocytes Relative: 30 %
MCH: 29.2 pg (ref 26.0–34.0)
MCHC: 33.7 g/dL (ref 30.0–36.0)
MCV: 86.8 fL (ref 78.0–100.0)
MONO ABS: 0.7 10*3/uL (ref 0.1–1.0)
MONOS PCT: 8 %
NEUTROS PCT: 60 %
Neutro Abs: 4.9 10*3/uL (ref 1.7–7.7)
Platelets: 261 10*3/uL (ref 150–400)
RBC: 4.48 MIL/uL (ref 4.22–5.81)
RDW: 12.4 % (ref 11.5–15.5)
WBC: 8.2 10*3/uL (ref 4.0–10.5)

## 2016-01-18 LAB — POCT ACTIVATED CLOTTING TIME: ACTIVATED CLOTTING TIME: 368 s

## 2016-01-18 LAB — TROPONIN I: Troponin I: 23.15 ng/mL (ref <0.03)

## 2016-01-18 LAB — D-DIMER, QUANTITATIVE (NOT AT ARMC): D DIMER QUANT: 0.43 ug{FEU}/mL (ref 0.00–0.50)

## 2016-01-18 LAB — MRSA PCR SCREENING: MRSA by PCR: NEGATIVE

## 2016-01-18 LAB — PLATELET COUNT: PLATELETS: 272 10*3/uL (ref 150–400)

## 2016-01-18 SURGERY — LEFT HEART CATH AND CORONARY ANGIOGRAPHY
Anesthesia: LOCAL

## 2016-01-18 MED ORDER — NITROGLYCERIN IN D5W 200-5 MCG/ML-% IV SOLN
INTRAVENOUS | Status: AC
  Filled 2016-01-18: qty 250

## 2016-01-18 MED ORDER — NITROGLYCERIN 1 MG/10 ML FOR IR/CATH LAB
INTRA_ARTERIAL | Status: AC
  Filled 2016-01-18: qty 10

## 2016-01-18 MED ORDER — TICAGRELOR 90 MG PO TABS
90.0000 mg | ORAL_TABLET | Freq: Two times a day (BID) | ORAL | Status: DC
  Administered 2016-01-19 – 2016-01-21 (×5): 90 mg via ORAL
  Filled 2016-01-18 (×5): qty 1

## 2016-01-18 MED ORDER — MIDAZOLAM HCL 2 MG/2ML IJ SOLN
INTRAMUSCULAR | Status: DC | PRN
  Administered 2016-01-18: 1 mg via INTRAVENOUS
  Administered 2016-01-18: 2 mg via INTRAVENOUS

## 2016-01-18 MED ORDER — PERFLUTREN LIPID MICROSPHERE
1.0000 mL | INTRAVENOUS | Status: AC | PRN
  Administered 2016-01-18: 3 mL via INTRAVENOUS
  Filled 2016-01-18: qty 10

## 2016-01-18 MED ORDER — HEPARIN (PORCINE) IN NACL 2-0.9 UNIT/ML-% IJ SOLN
INTRAMUSCULAR | Status: DC | PRN
  Administered 2016-01-18: 13:00:00

## 2016-01-18 MED ORDER — FENTANYL CITRATE (PF) 100 MCG/2ML IJ SOLN
INTRAMUSCULAR | Status: DC | PRN
  Administered 2016-01-18 (×2): 25 ug via INTRAVENOUS

## 2016-01-18 MED ORDER — ASPIRIN 81 MG PO CHEW: 81.0000 mg | CHEWABLE_TABLET | Freq: Every day | ORAL | Status: DC

## 2016-01-18 MED ORDER — HEPARIN SODIUM (PORCINE) 1000 UNIT/ML IJ SOLN
INTRAMUSCULAR | Status: DC | PRN
  Administered 2016-01-18: 9000 [IU] via INTRAVENOUS

## 2016-01-18 MED ORDER — VERAPAMIL HCL 2.5 MG/ML IV SOLN
INTRAVENOUS | Status: DC | PRN
  Administered 2016-01-18: 12:00:00 via INTRA_ARTERIAL

## 2016-01-18 MED ORDER — MIDAZOLAM HCL 2 MG/2ML IJ SOLN
INTRAMUSCULAR | Status: AC
  Filled 2016-01-18: qty 2

## 2016-01-18 MED ORDER — SODIUM CHLORIDE 0.9% FLUSH: 3.0000 mL | INTRAVENOUS | Status: DC | PRN

## 2016-01-18 MED ORDER — HEPARIN (PORCINE) IN NACL 100-0.45 UNIT/ML-% IJ SOLN
1200.0000 [IU]/h | INTRAMUSCULAR | Status: DC
  Administered 2016-01-18: 1200 [IU]/h via INTRAVENOUS
  Filled 2016-01-18: qty 250

## 2016-01-18 MED ORDER — ONDANSETRON HCL 4 MG/2ML IJ SOLN: 4.0000 mg | Freq: Four times a day (QID) | INTRAMUSCULAR | Status: DC | PRN

## 2016-01-18 MED ORDER — BIVALIRUDIN BOLUS VIA INFUSION - CUPID
INTRAVENOUS | Status: DC | PRN
  Administered 2016-01-18: 81.675 mg via INTRAVENOUS

## 2016-01-18 MED ORDER — FENTANYL CITRATE (PF) 100 MCG/2ML IJ SOLN
INTRAMUSCULAR | Status: AC
  Filled 2016-01-18: qty 2

## 2016-01-18 MED ORDER — ASPIRIN 81 MG PO CHEW: 324.0000 mg | CHEWABLE_TABLET | Freq: Once | ORAL | Status: DC

## 2016-01-18 MED ORDER — ONDANSETRON HCL 4 MG/2ML IJ SOLN
4.0000 mg | Freq: Once | INTRAMUSCULAR | Status: AC
  Administered 2016-01-18: 4 mg via INTRAVENOUS
  Filled 2016-01-18: qty 2

## 2016-01-18 MED ORDER — NITROGLYCERIN IN D5W 200-5 MCG/ML-% IV SOLN
0.0000 ug/min | INTRAVENOUS | Status: DC
  Administered 2016-01-18: 5 ug/min via INTRAVENOUS
  Filled 2016-01-18: qty 250

## 2016-01-18 MED ORDER — SODIUM CHLORIDE 0.9 % WEIGHT BASED INFUSION: 1.0000 mL/kg/h | INTRAVENOUS | Status: AC

## 2016-01-18 MED ORDER — SODIUM CHLORIDE 0.9% FLUSH
3.0000 mL | Freq: Two times a day (BID) | INTRAVENOUS | Status: DC
  Administered 2016-01-19: 3 mL via INTRAVENOUS

## 2016-01-18 MED ORDER — HYDROMORPHONE HCL 1 MG/ML IJ SOLN
1.0000 mg | INTRAMUSCULAR | Status: DC | PRN
  Filled 2016-01-18: qty 1

## 2016-01-18 MED ORDER — TIROFIBAN HCL IN NACL 5-0.9 MG/100ML-% IV SOLN
INTRAVENOUS | Status: DC | PRN
  Administered 2016-01-18 (×2): 0.15 ug/kg/min via INTRAVENOUS

## 2016-01-18 MED ORDER — HEPARIN (PORCINE) IN NACL 2-0.9 UNIT/ML-% IJ SOLN
INTRAMUSCULAR | Status: AC
  Filled 2016-01-18: qty 1000

## 2016-01-18 MED ORDER — HEPARIN (PORCINE) IN NACL 100-0.45 UNIT/ML-% IJ SOLN
1850.0000 [IU]/h | INTRAMUSCULAR | Status: DC
  Administered 2016-01-18: 1200 [IU]/h via INTRAVENOUS
  Administered 2016-01-20: 1850 [IU]/h via INTRAVENOUS
  Filled 2016-01-18 (×4): qty 250

## 2016-01-18 MED ORDER — NITROGLYCERIN IN D5W 200-5 MCG/ML-% IV SOLN
INTRAVENOUS | Status: DC | PRN
  Administered 2016-01-18: 10 ug/min via INTRAVENOUS

## 2016-01-18 MED ORDER — OMEGA-3-ACID ETHYL ESTERS 1 G PO CAPS
1.0000 | ORAL_CAPSULE | Freq: Two times a day (BID) | ORAL | Status: DC
  Administered 2016-01-18 – 2016-01-23 (×9): 1 g via ORAL
  Filled 2016-01-18 (×9): qty 1

## 2016-01-18 MED ORDER — ADENOSINE (DIAGNOSTIC) FOR INTRACORONARY USE
INTRAVENOUS | Status: DC | PRN
  Administered 2016-01-18: 60 ug via INTRACORONARY

## 2016-01-18 MED ORDER — LIDOCAINE HCL (PF) 1 % IJ SOLN
INTRAMUSCULAR | Status: DC | PRN
  Administered 2016-01-18: 3 mL via INTRADERMAL

## 2016-01-18 MED ORDER — TIROFIBAN (AGGRASTAT) BOLUS VIA INFUSION
INTRAVENOUS | Status: DC | PRN
  Administered 2016-01-18: 2722.5 ug via INTRAVENOUS

## 2016-01-18 MED ORDER — TICAGRELOR 90 MG PO TABS
ORAL_TABLET | ORAL | Status: DC | PRN
  Administered 2016-01-18: 180 mg via ORAL

## 2016-01-18 MED ORDER — FENOFIBRATE 160 MG PO TABS: 160.0000 mg | ORAL_TABLET | Freq: Every day | ORAL | Status: DC

## 2016-01-18 MED ORDER — LIDOCAINE HCL (PF) 1 % IJ SOLN
INTRAMUSCULAR | Status: AC
  Filled 2016-01-18: qty 30

## 2016-01-18 MED ORDER — ASPIRIN 81 MG PO CHEW
81.0000 mg | CHEWABLE_TABLET | Freq: Every day | ORAL | Status: DC
  Administered 2016-01-19 – 2016-01-20 (×2): 81 mg via ORAL
  Filled 2016-01-18 (×2): qty 1

## 2016-01-18 MED ORDER — TIROFIBAN HCL IN NACL 5-0.9 MG/100ML-% IV SOLN
INTRAVENOUS | Status: AC
  Filled 2016-01-18: qty 100

## 2016-01-18 MED ORDER — NITROGLYCERIN 0.4 MG SL SUBL
0.4000 mg | SUBLINGUAL_TABLET | SUBLINGUAL | Status: DC | PRN
  Administered 2016-01-18 (×2): 0.4 mg via SUBLINGUAL
  Filled 2016-01-18: qty 1

## 2016-01-18 MED ORDER — TICAGRELOR 90 MG PO TABS
ORAL_TABLET | ORAL | Status: AC
  Filled 2016-01-18: qty 2

## 2016-01-18 MED ORDER — METOPROLOL TARTRATE 12.5 MG HALF TABLET
12.5000 mg | ORAL_TABLET | Freq: Two times a day (BID) | ORAL | Status: DC
  Administered 2016-01-18 – 2016-01-22 (×7): 12.5 mg via ORAL
  Filled 2016-01-18 (×7): qty 1

## 2016-01-18 MED ORDER — HEPARIN BOLUS VIA INFUSION
4000.0000 [IU] | Freq: Once | INTRAVENOUS | Status: AC
  Administered 2016-01-18: 4000 [IU] via INTRAVENOUS
  Filled 2016-01-18: qty 4000

## 2016-01-18 MED ORDER — BIVALIRUDIN 250 MG IV SOLR
INTRAVENOUS | Status: AC
  Filled 2016-01-18: qty 250

## 2016-01-18 MED ORDER — SODIUM CHLORIDE 0.9 % IV SOLN
INTRAVENOUS | Status: DC | PRN
  Administered 2016-01-18: 1.75 mg/kg/h via INTRAVENOUS

## 2016-01-18 MED ORDER — GI COCKTAIL ~~LOC~~
30.0000 mL | Freq: Once | ORAL | Status: DC
  Filled 2016-01-18: qty 30

## 2016-01-18 MED ORDER — MORPHINE SULFATE (PF) 4 MG/ML IV SOLN
4.0000 mg | Freq: Once | INTRAVENOUS | Status: AC
  Administered 2016-01-18: 4 mg via INTRAVENOUS
  Filled 2016-01-18: qty 1

## 2016-01-18 MED ORDER — IOPAMIDOL (ISOVUE-370) INJECTION 76%
INTRAVENOUS | Status: DC | PRN
  Administered 2016-01-18: 145 mL via INTRA_ARTERIAL

## 2016-01-18 MED ORDER — ACETAMINOPHEN 325 MG PO TABS: 650.0000 mg | ORAL_TABLET | Freq: Once | ORAL | Status: DC

## 2016-01-18 MED ORDER — ASPIRIN 81 MG PO CHEW
324.0000 mg | CHEWABLE_TABLET | Freq: Once | ORAL | Status: AC
  Administered 2016-01-18: 324 mg via ORAL

## 2016-01-18 MED ORDER — TIROFIBAN HCL IN NACL 5-0.9 MG/100ML-% IV SOLN
0.1500 ug/kg/min | INTRAVENOUS | Status: AC
  Administered 2016-01-18 – 2016-01-19 (×3): 0.15 ug/kg/min via INTRAVENOUS
  Filled 2016-01-18 (×3): qty 100

## 2016-01-18 MED ORDER — HEPARIN SODIUM (PORCINE) 1000 UNIT/ML IJ SOLN
INTRAMUSCULAR | Status: AC
  Filled 2016-01-18: qty 1

## 2016-01-18 MED ORDER — ATORVASTATIN CALCIUM 80 MG PO TABS
80.0000 mg | ORAL_TABLET | Freq: Every day | ORAL | Status: DC
  Administered 2016-01-18 – 2016-01-20 (×3): 80 mg via ORAL
  Filled 2016-01-18 (×3): qty 1

## 2016-01-18 MED ORDER — ACETAMINOPHEN 325 MG PO TABS
650.0000 mg | ORAL_TABLET | ORAL | Status: DC | PRN
  Filled 2016-01-18: qty 2

## 2016-01-18 MED ORDER — SODIUM CHLORIDE 0.9 % IV SOLN: 250.0000 mL | INTRAVENOUS | Status: DC | PRN

## 2016-01-18 MED ORDER — ADENOSINE 6 MG/2ML IV SOLN
INTRAVENOUS | Status: AC
  Filled 2016-01-18: qty 2

## 2016-01-18 MED ORDER — NITROGLYCERIN IN D5W 200-5 MCG/ML-% IV SOLN: 0.0000 ug/min | INTRAVENOUS | Status: DC

## 2016-01-18 SURGICAL SUPPLY — 20 items
BALLN EMERGE MR 3.0X15 (BALLOONS) ×2
BALLN ~~LOC~~ EMERGE MR 4.0X12 (BALLOONS) ×2
BALLOON EMERGE MR 3.0X15 (BALLOONS) IMPLANT
BALLOON ~~LOC~~ EMERGE MR 4.0X12 (BALLOONS) IMPLANT
CATH EXTRAC PRONTO 5.5F 138CM (CATHETERS) ×1 IMPLANT
CATH INFINITI 5 FR JL3.5 (CATHETERS) ×1 IMPLANT
CATH INFINITI 5FR ANG PIGTAIL (CATHETERS) ×1 IMPLANT
CATH INFINITI JR4 5F (CATHETERS) ×1 IMPLANT
CATH VISTA GUIDE 6FR MPA1 (CATHETERS) ×1 IMPLANT
DEVICE RAD COMP TR BAND LRG (VASCULAR PRODUCTS) ×1 IMPLANT
GLIDESHEATH SLEND SS 6F .021 (SHEATH) ×1 IMPLANT
KIT ENCORE 26 ADVANTAGE (KITS) ×1 IMPLANT
KIT HEART LEFT (KITS) ×2 IMPLANT
PACK CARDIAC CATHETERIZATION (CUSTOM PROCEDURE TRAY) ×2 IMPLANT
STENT SYNERGY DES 3.5X38 (Permanent Stent) ×1 IMPLANT
TRANSDUCER W/STOPCOCK (MISCELLANEOUS) ×2 IMPLANT
TUBING CIL FLEX 10 FLL-RA (TUBING) ×2 IMPLANT
VALVE GUARDIAN II ~~LOC~~ HEMO (MISCELLANEOUS) ×1 IMPLANT
WIRE ASAHI PROWATER 180CM (WIRE) ×1 IMPLANT
WIRE SAFE-T 1.5MM-J .035X260CM (WIRE) ×1 IMPLANT

## 2016-01-18 NOTE — ED Notes (Signed)
Pt. Transported to xray at this time.  

## 2016-01-18 NOTE — ED Notes (Signed)
Cardiology at bedside.

## 2016-01-18 NOTE — ED Triage Notes (Signed)
Chest pain started 1 week ago-- has been intermittent for 1 week, pt states is tired, weak feeling. Diaphoretic. Pain woke him up, unable to "breath good".

## 2016-01-18 NOTE — ED Notes (Signed)
IV team at bedside 

## 2016-01-18 NOTE — Progress Notes (Signed)
ANTICOAGULATION CONSULT NOTE - Initial Consult  Pharmacy Consult for Heparin Indication: chest pain/ACS  No Known Allergies  Patient Measurements: Height: 6' (182.9 cm) Weight: 240 lb (108.9 kg) IBW/kg (Calculated) : 77.6 Heparin Dosing Weight: 97 kg  Vital Signs: Temp: 99.7 F (37.6 C) (08/05 0805) Temp Source: Oral (08/05 0805) BP: 132/84 (08/05 0915) Pulse Rate: 56 (08/05 0915)  Labs:  Recent Labs  01/18/16 0834  HGB 13.1  HCT 38.9*  PLT 261  CREATININE 1.40*    Estimated Creatinine Clearance: 84 mL/min (by C-G formula based on SCr of 1.4 mg/dL).   Medical History: Past Medical History:  Diagnosis Date  . Allergy   . Hyperglycemia   . Hyperlipidemia    triglyceriedes   . Hypertriglyceridemia   . Obesity   . Sickle cell trait [facility])     Assessment: 45 yo male with chest pain and r/o NSTEMI with PMH of obesity, HLD. Pharmacy consulted to dose heparin.  Goal of Therapy:  Heparin level 0.3-0.7 units/ml Monitor platelets by anticoagulation protocol: Yes   Plan:  Give 4000 units bolus x 1 Start heparin infusion at 1200 units/hr Check anti-Xa level in 6 hours and daily while on heparin Continue to monitor H&H and platelets  Carlean Jews, Pharm.D. PGY1 Pharmacy Resident 8/5/20179:33 AM Pager [phone]

## 2016-01-18 NOTE — H&P (Signed)
Crenshaw, MD Physician Addendum Cardiology Consult Note Date of Service: 01/18/2016 9:36 AM    Expand All Collapse All   []Hide copied text []Hover for attribution information    Primary cardiologist: New  HPI: 45 yo male with PMH of hyperlipidemia for evaluation of chest pain and dyspnea.  No prior cardiac history. Patient states that for the past 1 week he has had pain in the substernal and left chest area. It is described as a pressure without radiation. Increases with cough.  Patient also describes increased dyspnea on exertion 1 week. His dyspnea also increases with lying flat. He denies pedal edema or syncope. 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  He presented to the emergency room and his pain has improved.  His troponin is elevated and cardiology asked to evaluate.   (Not in a hospital admission)  No Known Allergies       Past Medical History:  Diagnosis Date  . Allergy   . Hyperglycemia   . Hyperlipidemia    triglyceriedes   . Hypertriglyceridemia   . Obesity   . Sickle cell trait (HCC)          Past Surgical History:  Procedure Laterality Date  . ANKLE SURGERY      Social History        Social History  . Marital status: Married    Spouse name: N/A  . Number of children: 2  . Years of education: N/A       Occupational History  . Truck driver          Social History Main Topics  . Smoking status: Former Smoker    Types: Cigars  . Smokeless tobacco: Never Used  . Alcohol use 0.0 oz/week     Comment: Occasionally   . Drug use: No  . Sexual activity: Not on file       Other Topics Concern  . Not on file      Social History Narrative   Some caffeine           Family History  Problem Relation Age of Onset  . Coronary artery disease Mother   . Heart attack Mother   . Hyperlipidemia Mother   . Alcohol abuse Father   . Hyperlipidemia Father   . Hypertension Father   . Hypertension  Brother   . Cancer Maternal Grandmother     brain    ROS:  Hemoptysis but no fevers or chills, productive cough, dysphasia, odynophagia, melena, hematochezia, dysuria, hematuria, rash, seizure activity, orthopnea, PND, pedal edema, claudication. Remaining systems are negative.  Physical Exam:   Blood pressure 132/84, pulse (!) 56, temperature (S) 99.7 F (37.6 C), temperature source Oral, resp. rate 12, height 6' (1.829 m), weight 240 lb (108.9 kg), SpO2 100 %.  General:  Well developed/well nourished in NAD Skin warm/dry Patient not depressed No peripheral clubbing Back-normal HEENT-normal/normal eyelids Neck supple/normal carotid upstroke bilaterally; no bruits; no JVD; no thyromegaly chest - CTA/ normal expansion CV - RRR/normal S1 and S2; no murmurs, rubs or gallops;  PMI nondisplaced Abdomen -NT/ND, no HSM, no mass, + bowel sounds, no bruit 2+ femoral pulses, no bruits Ext-no edema, chords, 2+ DP Neuro-grossly nonfocal  ECG normal sinus rhythm, left ventricular hypertrophy, inferior lateral T-wave inversion.  Lab Results Last 48 Hours        Results for orders placed or performed during the hospital encounter of 01/18/16 (from the past 48 hour(s))  Basic metabolic panel       Status: Abnormal   Collection Time: 01/18/16  8:34 AM  Result Value Ref Range   Sodium 138 135 - 145 mmol/L   Potassium 3.9 3.5 - 5.1 mmol/L   Chloride 109 101 - 111 mmol/L   CO2 21 (L) 22 - 32 mmol/L   Glucose, Bld 210 (H) 65 - 99 mg/dL   BUN 14 6 - 20 mg/dL   Creatinine, Ser 1.40 (H) 0.61 - 1.24 mg/dL   Calcium 9.6 8.9 - 10.3 mg/dL   GFR calc non Af Amer 59 (L) >60 mL/min   GFR calc Af Amer >60 >60 mL/min    Comment: (NOTE) The eGFR has been calculated using the CKD EPI equation. This calculation has not been validated in all clinical situations. eGFR's persistently <60 mL/min signify possible Chronic Kidney Disease.   Anion gap 8 5 - 15  CBC with Differential      Status: Abnormal   Collection Time: 01/18/16  8:34 AM  Result Value Ref Range   WBC 8.2 4.0 - 10.5 K/uL   RBC 4.48 4.22 - 5.81 MIL/uL   Hemoglobin 13.1 13.0 - 17.0 g/dL   HCT 38.9 (L) 39.0 - 52.0 %   MCV 86.8 78.0 - 100.0 fL   MCH 29.2 26.0 - 34.0 pg   MCHC 33.7 30.0 - 36.0 g/dL   RDW 12.4 11.5 - 15.5 %   Platelets 261 150 - 400 K/uL   Neutrophils Relative % 60 %   Neutro Abs 4.9 1.7 - 7.7 K/uL   Lymphocytes Relative 30 %   Lymphs Abs 2.5 0.7 - 4.0 K/uL   Monocytes Relative 8 %   Monocytes Absolute 0.7 0.1 - 1.0 K/uL   Eosinophils Relative 2 %   Eosinophils Absolute 0.1 0.0 - 0.7 K/uL   Basophils Relative 0 %   Basophils Absolute 0.0 0.0 - 0.1 K/uL  I-stat troponin, ED     Status: Abnormal   Collection Time: 01/18/16  8:45 AM  Result Value Ref Range   Troponin i, poc 1.10 (HH) 0.00 - 0.08 ng/mL   Comment NOTIFIED PHYSICIAN    Comment 3            Comment: Due to the release kinetics of cTnI, a negative result within the first hours of the onset of symptoms does not rule out myocardial infarction with certainty. If myocardial infarction is still suspected, repeat the test at appropriate intervals.  I-Stat CG4 Lactic Acid, ED     Status: None   Collection Time: 01/18/16  8:47 AM  Result Value Ref Range   Lactic Acid, Venous 1.45 0.5 - 1.9 mmol/L       Imaging Results (Last 48 hours)  Dg Chest 2 View  Result Date: 01/18/2016 CLINICAL DATA:  45-year-old male with a history of intermittent shortness of breath and fever. Left-sided chest pain. EXAM: CHEST  2 VIEW COMPARISON:  None. FINDINGS: The heart size and mediastinal contours are within normal limits. Both lungs are clear. The visualized skeletal structures are unremarkable. IMPRESSION: No radiographic evidence of acute cardiopulmonary disease. Signed, Jaime S. Wagner, DO [facility] Electronically Signed   By: Jaime  Wagner D.O.    On: 01/18/2016 09:19     Assessment/Plan 1 chest pain-pt presents with complaints of one week of almost continuous chest pain and severe dyspnea. He is a truck driver and describes mild hemoptysis as well. I am concerned about the possibility of pulmonary embolus. We will arrange a d-dimer and if   elevated proceed with CTA. We will begin IV heparin now. His troponin elevation could be from a pulmonary embolus. We have also not fully excluded acute coronary syndrome.  Continue to cycle enzymes. If workup for pulmonary embolus is negative then we will treat him for non-ST elevation myocardial infarction and he would require cardiac catheterization. Add ASA. He would also require beta blockade and statin if this indeed were an acute coronary syndrome. Note I did perform a quick look echocardiogram at bedside to assess LV function and try to assess RV. Images were poor. We will obtain a  Stat echocardiogram to better assess. 2 chronic kidney disease-previous creatinine 1.38 now 1.4. Follow closely. 3 hyperlipidemia-continue preadmission medications.  Brian Crenshaw MD 01/18/2016, 9:48 AM  Addendum-d-dimer is normal. Quick look echocardiogram shows normal LV systolic function and normal RV. This makes  Pulmonary embolus much less likely. We will therefore treat as a non-ST elevation myocardial infarction. Continue aspirin, heparin and nitroglycerin.  Add Lipitor 80 mg daily and discontinue fenofibrate. Patient will ultimately require cardiac catheterization. Admit to CCU. Check lower extremity Dopplers. Note he is presently pain-free. Repeat electrocardiogram shows sinus rhythm with inferior lateral T-wave inversion. Brian Crenshaw, MD  Patient now describes mild chest pain. Given subtle ST elevation inferior laterally will proceed with emergent cardiac catheterization. The risks and benefits including myocardial infarction, CVA and death were discussed and he agrees to proceed. Brian Crenshaw, MD       

## 2016-01-18 NOTE — Consult Note (Addendum)
Primary cardiologist: New  HPI: 45 yo male with PMH of hyperlipidemia for evaluation of chest pain and dyspnea.  No prior cardiac history. Patient states that for the past 1 week he has had pain in the substernal and left chest area. It is described as a pressure without radiation. Increases with cough.  Patient also describes increased dyspnea on exertion 1 week. His dyspnea also increases with lying flat. He denies pedal edema or syncope. 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  He presented to the emergency room and his pain has improved.  His troponin is elevated and cardiology asked to evaluate.   (Not in a hospital admission)  No Known Allergies   Past Medical History:  Diagnosis Date  . Allergy   . Hyperglycemia   . Hyperlipidemia    triglyceriedes   . Hypertriglyceridemia   . Obesity   . Sickle cell trait [facility])     Past Surgical History:  Procedure Laterality Date  . ANKLE SURGERY      Social History   Social History  . Marital status: Married    Spouse name: N/A  . Number of children: 2  . Years of education: N/A   Occupational History  . Truck driver    Social History Main Topics  . Smoking status: Former Smoker    Types: Cigars  . Smokeless tobacco: Never Used  . Alcohol use 0.0 oz/week     Comment: Occasionally   . Drug use: No  . Sexual activity: Not on file   Other Topics Concern  . Not on file   Social History Narrative   Some caffeine     Family History  Problem Relation Age of Onset  . Coronary artery disease Mother   . Heart attack Mother   . Hyperlipidemia Mother   . Alcohol abuse Father   . Hyperlipidemia Father   . Hypertension Father   . Hypertension Brother   . Cancer Maternal Grandmother     brain    ROS:  Hemoptysis but no fevers or chills, productive cough, dysphasia, odynophagia, melena, hematochezia, dysuria, hematuria, rash, seizure activity, orthopnea, PND, pedal edema, claudication. Remaining systems  are negative.  Physical Exam:   Blood pressure 132/84, pulse (!) 56, temperature (S) 99.7 F (37.6 C), temperature source Oral, resp. rate 12, height 6' (1.829 m), weight 240 lb (108.9 kg), SpO2 100 %.  General:  Well developed/well nourished in NAD Skin warm/dry Patient not depressed No peripheral clubbing Back-normal HEENT-normal/normal eyelids Neck supple/normal carotid upstroke bilaterally; no bruits; no JVD; no thyromegaly chest - CTA/ normal expansion CV - RRR/normal S1 and S2; no murmurs, rubs or gallops;  PMI nondisplaced Abdomen -NT/ND, no HSM, no mass, + bowel sounds, no bruit 2+ femoral pulses, no bruits Ext-no edema, chords, 2+ DP Neuro-grossly nonfocal  ECG normal sinus rhythm, left ventricular hypertrophy, inferior lateral T-wave inversion.  Results for orders placed or performed during the hospital encounter of 01/18/16 (from the past 48 hour(s))  Basic metabolic panel     Status: Abnormal   Collection Time: 01/18/16  8:34 AM  Result Value Ref Range   Sodium 138 135 - 145 mmol/L   Potassium 3.9 3.5 - 5.1 mmol/L   Chloride 109 101 - 111 mmol/L   CO2 21 (L) 22 - 32 mmol/L   Glucose, Bld 210 (H) 65 - 99 mg/dL   BUN 14 6 - 20 mg/dL   Creatinine, Ser 1.40 (H) 0.61 - 1.24 mg/dL  Calcium 9.6 8.9 - 10.3 mg/dL   GFR calc non Af Amer 59 (L) >60 mL/min   GFR calc Af Amer >60 >60 mL/min    Comment: (NOTE) The eGFR has been calculated using the CKD EPI equation. This calculation has not been validated in all clinical situations. eGFR's persistently <60 mL/min signify possible Chronic Kidney Disease.    Anion gap 8 5 - 15  CBC with Differential     Status: Abnormal   Collection Time: 01/18/16  8:34 AM  Result Value Ref Range   WBC 8.2 4.0 - 10.5 K/uL   RBC 4.48 4.22 - 5.81 MIL/uL   Hemoglobin 13.1 13.0 - 17.0 g/dL   HCT 38.9 (L) 39.0 - 52.0 %   MCV 86.8 78.0 - 100.0 fL   MCH 29.2 26.0 - 34.0 pg   MCHC 33.7 30.0 - 36.0 g/dL   RDW 12.4 11.5 - 15.5 %   Platelets  261 150 - 400 K/uL   Neutrophils Relative % 60 %   Neutro Abs 4.9 1.7 - 7.7 K/uL   Lymphocytes Relative 30 %   Lymphs Abs 2.5 0.7 - 4.0 K/uL   Monocytes Relative 8 %   Monocytes Absolute 0.7 0.1 - 1.0 K/uL   Eosinophils Relative 2 %   Eosinophils Absolute 0.1 0.0 - 0.7 K/uL   Basophils Relative 0 %   Basophils Absolute 0.0 0.0 - 0.1 K/uL  I-stat troponin, ED     Status: Abnormal   Collection Time: 01/18/16  8:45 AM  Result Value Ref Range   Troponin i, poc 1.10 (HH) 0.00 - 0.08 ng/mL   Comment NOTIFIED PHYSICIAN    Comment 3            Comment: Due to the release kinetics of cTnI, a negative result within the first hours of the onset of symptoms does not rule out myocardial infarction with certainty. If myocardial infarction is still suspected, repeat the test at appropriate intervals.   I-Stat CG4 Lactic Acid, ED     Status: None   Collection Time: 01/18/16  8:47 AM  Result Value Ref Range   Lactic Acid, Venous 1.45 0.5 - 1.9 mmol/L    Dg Chest 2 View  Result Date: 01/18/2016 CLINICAL DATA:  45 year old male with a history of intermittent shortness of breath and fever. Left-sided chest pain. EXAM: CHEST  2 VIEW COMPARISON:  None. FINDINGS: The heart size and mediastinal contours are within normal limits. Both lungs are clear. The visualized skeletal structures are unremarkable. IMPRESSION: No radiographic evidence of acute cardiopulmonary disease. Signed, Dulcy Fanny. Earleen Newport, DO [facility] Electronically Signed   By: Corrie Mckusick D.O.   On: 01/18/2016 09:19    Assessment/Plan 1 chest pain-pt presents with complaints of one week of almost continuous chest pain and severe dyspnea. He is a Administrator and describes mild hemoptysis as well. I am concerned about the possibility of pulmonary embolus. We will arrange a d-dimer and if elevated proceed with CTA. We will begin IV heparin now. His troponin elevation could be from a  pulmonary embolus. We have also not fully excluded acute coronary syndrome.  Continue to cycle enzymes. If workup for pulmonary embolus is negative then we will treat him for non-ST elevation myocardial infarction and he would require cardiac catheterization. Add ASA. He would also require beta blockade and statin if this indeed were an acute coronary syndrome. Note I did perform a quick look echocardiogram at bedside to assess  LV function and try to assess RV. Images were poor. We will obtain a  Stat echocardiogram to better assess. 2 chronic kidney disease-previous creatinine 1.38 now 1.4. Follow closely. 3 hyperlipidemia-continue preadmission medications.  Kirk Ruths MD 01/18/2016, 9:48 AM  Addendum-d-dimer is normal. Quick look echocardiogram shows normal LV systolic function and normal RV. This makes  Pulmonary embolus much less likely. We will therefore treat as a non-ST elevation myocardial infarction. Continue aspirin, heparin and nitroglycerin.  Add Lipitor 80 mg daily and discontinue fenofibrate. Patient will ultimately require cardiac catheterization. Admit to CCU. Check lower extremity Dopplers. Note he is presently pain-free. Repeat electrocardiogram shows sinus rhythm with inferior lateral T-wave inversion. Kirk Ruths, MD  Patient now describes mild chest pain. Given subtle ST elevation inferior laterally will proceed with emergent cardiac catheterization. The risks and benefits including myocardial infarction, CVA and death were discussed and he agrees to proceed. Kirk Ruths, MD

## 2016-01-18 NOTE — ED Notes (Signed)
RN attempted IV unsuccessfully. Will have another RN attempt.  

## 2016-01-18 NOTE — ED Notes (Signed)
Chaplain service at bedside.

## 2016-01-18 NOTE — ED Notes (Signed)
ECHO at bedside.

## 2016-01-18 NOTE — Progress Notes (Signed)
   01/18/16 1116  Clinical Encounter Type  Visited With Patient and family together  Visit Type ED  Referral From Physician  Consult/Referral To Chaplain  Spiritual Encounters  Spiritual Needs Prayer;Emotional  Stress Factors  Patient Stress Factors Exhausted;Health changes;Lack of knowledge  Family Stress Factors Health changes;Lack of knowledge  Chaplain responded to page, patient feeling discomfort preparing to go to cath lab, family at bedside, provided spiritual presence, prayer and hospitality. Chaplain will be available for further support as needed.

## 2016-01-18 NOTE — ED Provider Notes (Signed)
[facility] DEPT Provider Note   CSN: [id number] Arrival date & time: 01/18/16  [id number]  First Provider Contact:  First MD Initiated Contact with Patient 01/18/16 [phone]     History   Chief Complaint Chief Complaint  Patient presents with  . Chest Pain    HPI  Andrew Burgess is an 45 y.o. male with history of HLD, obesity, sickle cell trait who presents to the ED for evaluation of chest pain. He reports he has been having intermittent chest pain for the past week. He states this morning around 3AM he woke from sleep with severe centralized chest pain. He reports the pain does not radiates. He endorses associated shortness of breath and feeling heavy. He states he feels like he cannot breathe. The pain is now constant and is worse with exertion. Not positional. Endorses associated weakness and diaphoresis. States he felt nauseated earlier but denies emesis. He states he feels slightly lightheaded. He states nothing like this has ever happened before. Endorses CAD and MI in both of his parents. States this week he has had a slightly productive cough. Denies recent drug use.  Past Medical History:  Diagnosis Date  . Allergy   . Hyperglycemia   . Hyperlipidemia    triglyceriedes   . Hypertriglyceridemia   . Obesity   . Sickle cell trait [facility])     Patient Active Problem List   Diagnosis Date Noted  . Muscle weakness (generalized) 08/29/2012  . Stress reaction 01/19/2011  . Screen for STD (sexually transmitted disease) 01/12/2011  . Obesity 11/14/2007  . ALLERGIC RHINITIS 11/11/2007  . Hyperglycemia 11/11/2007  . Hypertriglyceridemia 03/04/2007    Past Surgical History:  Procedure Laterality Date  . ANKLE SURGERY      Home Medications    Prior to Admission medications   Medication Sig Start Date End Date Taking? Authorizing Provider  fenofibrate 160 MG tablet Take 1 tablet (160 mg total) by mouth daily. 02/25/15   Abner Greenspan, MD  naproxen sodium (ALEVE) 220 MG tablet Take  220 mg by mouth as directed.      Historical Provider, MD  omega-3 acid ethyl esters (LOVAZA) 1 G capsule Take 1 capsule (1 g total) by mouth 2 (two) times daily. 02/25/15   Abner Greenspan, MD    Family History Family History  Problem Relation Age of Onset  . Coronary artery disease Mother   . Heart attack Mother   . Hyperlipidemia Mother   . Alcohol abuse Father   . Hyperlipidemia Father   . Hypertension Father   . Hypertension Brother   . Cancer Maternal Grandmother     brain    Social History Social History  Substance Use Topics  . Smoking status: Former Smoker    Types: Cigars  . Smokeless tobacco: Never Used  . Alcohol use 0.0 oz/week     Comment: Occasionally      Allergies   Review of patient's allergies indicates no known allergies.   Review of Systems Review of Systems 10 Systems reviewed and are negative for acute change except as noted in the HPI.   Physical Exam Updated Vital Signs BP 177/99   Pulse 66   Temp (S) 99.7 F (37.6 C) (Oral)   Resp 18   Ht 6' (1.829 m)   Wt 108.9 kg   SpO2 100%   BMI 32.55 kg/m   Physical Exam  Constitutional: He is oriented to person, place, and time.  Obese Appears uncomfortable  HENT:  Right  Ear: External ear normal.  Left Ear: External ear normal.  Nose: Nose normal.  Mouth/Throat: Oropharynx is clear and moist. No oropharyngeal exudate.  Eyes: Conjunctivae are normal.  Neck: Neck supple.  Cardiovascular: Normal rate, regular rhythm, normal heart sounds and intact distal pulses.   Pulmonary/Chest: Effort normal and breath sounds normal. No respiratory distress. He has no wheezes. He exhibits no tenderness.  No increased WOB. No tachypnea.  Abdominal: Soft. Bowel sounds are normal. He exhibits no distension. There is no tenderness.  Musculoskeletal: He exhibits no edema.  No LE edema or calf tenderness  Lymphadenopathy:    He has no cervical adenopathy.  Neurological: He is alert and oriented to person,  place, and time. No cranial nerve deficit.  Skin: Skin is warm and dry.  Psychiatric: He has a normal mood and affect.  Nursing note and vitals reviewed.    ED Treatments / Results  Labs (all labs ordered are listed, but only abnormal results are displayed) Labs Reviewed  BASIC METABOLIC PANEL - Abnormal; Notable for the following:       Result Value   CO2 21 (*)    Glucose, Bld 210 (*)    Creatinine, Ser 1.40 (*)    GFR calc non Af Amer 59 (*)    All other components within normal limits  CBC WITH DIFFERENTIAL/PLATELET - Abnormal; Notable for the following:    HCT 38.9 (*)    All other components within normal limits  I-STAT TROPOININ, ED - Abnormal; Notable for the following:    Troponin i, poc 1.10 (*)    All other components within normal limits  URINALYSIS, ROUTINE W REFLEX MICROSCOPIC (NOT AT [facility])  I-STAT CG4 LACTIC ACID, ED    EKG  EKG Interpretation  Date/Time:  Saturday January 18 2016 08:00:45 EDT Ventricular Rate:  62 PR Interval:  170 QRS Duration: 96 QT Interval:  390 QTC Calculation: 395 R Axis:   16 Text Interpretation:  Normal sinus rhythm Minimal voltage criteria for LVH, may be normal variant Inferior infarct , age undetermined Abnormal ECG abnormal T wave inversion, nonspecific ST no STEMI. NO OLD COMPARISON Confirmed by Johnney Killian, MD, Jeannie Done [phone]) on 01/18/2016 8:08:12 AM       Radiology Dg Chest 2 View  Result Date: 01/18/2016 CLINICAL DATA:  45 year old male with a history of intermittent shortness of breath and fever. Left-sided chest pain. EXAM: CHEST  2 VIEW COMPARISON:  None. FINDINGS: The heart size and mediastinal contours are within normal limits. Both lungs are clear. The visualized skeletal structures are unremarkable. IMPRESSION: No radiographic evidence of acute cardiopulmonary disease. Signed, Dulcy Fanny. Earleen Newport, DO [facility] Electronically Signed   By: Corrie Mckusick D.O.   On:  01/18/2016 09:19    Procedures Procedures (including critical care time)  CRITICAL CARE Performed by: Delrae Rend   Total critical care time: 30 minutes  Critical care time was exclusive of separately billable procedures and treating other patients.  Critical care was necessary to treat or prevent imminent or life-threatening deterioration.  Critical care was time spent personally by me on the following activities: development of treatment plan with patient and/or surrogate as well as nursing, discussions with consultants, evaluation of patient's response to treatment, examination of patient, obtaining history from patient or surrogate, ordering and performing treatments and interventions, ordering and review of laboratory studies, ordering and review of radiographic studies, pulse oximetry and re-evaluation of patient's condition.   Medications Ordered in ED Medications  nitroGLYCERIN (NITROSTAT) SL tablet 0.4 mg (0.4 mg Sublingual Given 01/18/16 0916)  nitroGLYCERIN 50 mg in dextrose 5 % 250 mL (0.2 mg/mL) infusion (not administered)  heparin bolus via infusion 4,000 Units (not administered)    Followed by  heparin ADULT infusion 100 units/mL (25000 units/241mL sodium chloride 0.45%) (not administered)  aspirin chewable tablet 324 mg (324 mg Oral Given 01/18/16 0843)  morphine 4 MG/ML injection 4 mg (4 mg Intravenous Given 01/18/16 0915)  ondansetron (ZOFRAN) injection 4 mg (4 mg Intravenous Given 01/18/16 0915)     Initial Impression / Assessment and Plan / ED Course  I have reviewed the triage vital signs and the nursing notes.  Pertinent labs & imaging results that were available during my care of the patient were reviewed by me and considered in my medical decision making (see chart for details).  Clinical Course     9:20 AM I spoke with Dr. Stanford Breed of cardiology. I am concerned about NSTEMI. Troponin 1.10. EKG with TWI and ST changes but not STEMI.  Minimal relief in pain  after SL nitro and ASA. Morphine being given. We will plan to start heparin and IV nitro drip in the ED. Cards to come see.  9:38 AM  Pt reported to cardiology team he has been experiencing hemoptysis as well. 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 Dr. Stanford Breed to do bedside US. IV team consult has been placed due to difficulty in IV start for heparin/nitro.   10:12 AM D-dimer negative. Pt clarified he has been coughing up sputum with few streaks of blood. Bedside echo with normal EF. Suspect NSTEMI. Will plan for admission and ultimately cath.  Final Clinical Impressions(s) / ED Diagnoses   Final diagnoses:  NSTEMI (non-ST elevated myocardial infarction) [facility])    New Prescriptions New Prescriptions   No medications on file     Anne Ng, PA-C 01/18/16 1024    Anne Ng, PA-C 01/18/16 1212    Charlesetta Shanks, MD 01/19/16 [phone]

## 2016-01-18 NOTE — Progress Notes (Signed)
ANTICOAGULATION CONSULT NOTE - Initial Consult  Pharmacy Consult for heparin  Indication: chest pain/ACS  No Known Allergies  Patient Measurements: Height: 6' (182.9 cm) Weight: 240 lb (108.9 kg) IBW/kg (Calculated) : 77.6 Heparin Dosing Weight: 100 kg   Vital Signs: Temp: 98 F (36.7 C) (08/05 1332) Temp Source: Oral (08/05 1332) BP: 133/89 (08/05 1259) Pulse Rate: 60 (08/05 1259)  Labs:  Recent Labs  01/18/16 0834 01/18/16 1342  HGB 13.1  --   HCT 38.9*  --   PLT 261  --   CREATININE 1.40*  --   CKTOTAL  --  937*  CKMB  --  127.6*  TROPONINI  --  23.15*    Estimated Creatinine Clearance: 84 mL/min (by C-G formula based on SCr of 1.4 mg/dL).   Medical History: Past Medical History:  Diagnosis Date  . Allergy   . Anginal pain ([place])   . Hyperglycemia   . Hyperlipidemia    triglyceriedes   . Hypertriglyceridemia   . Obesity   . Sickle cell trait [facility])     Assessment: 45 yo male presents with chest pain and SOB. Code STEMI and to cath lab. No AC PTA, except 81 mg ASA.  Heparin gtt started at admission to ED for ACS, then stopped in cath lab. Mid to Dist RCA 100 % stenosed and DES placed.   Tirofiban continued for 18 hours post cath, to discontinue at 6AM. Sheath pulled in cath lab at 1300 PM. Start heparin gtt in 8 hours at 2100. Per Dr. Irish Lack cath note - will continue heparin until Monday. Will need DAPT with ASA + Brilinta for one year.   Troponin elevated from 1.10 to 23. EF 55-60%. Hgb 13.1, plt 261. Of note, patient with recent history of hemoptysis. No overt signs or symptoms of bleeding at this time.   Goal of Therapy:  Goal heparin level 0.3-0.5 while on Tirofiban  Goal heparin level 0.3-0.7 once Tirofiban discontinued (only on heparin)  Monitor platelets by anticoagulation protocol: Yes   Plan:  Start heparin infusion at 1200 units/hr at 2100 PM  Heparin level in 6 hours after gtt started  Check platelets at 1900 PM Daily heparin level and  CBC  Monitor for signs and symptoms of bleeding   Argie Ramming, PharmD Pharmacy Resident  Pager [phone] 01/18/16 3:11 PM

## 2016-01-18 NOTE — Progress Notes (Signed)
  Echocardiogram 2D Echocardiogram has been performed with definity.  Aggie Cosier 01/18/2016, 10:57 AM

## 2016-01-19 ENCOUNTER — Inpatient Hospital Stay (HOSPITAL_COMMUNITY): Payer: 59

## 2016-01-19 DIAGNOSIS — I2119 ST elevation (STEMI) myocardial infarction involving other coronary artery of inferior wall: Principal | ICD-10-CM

## 2016-01-19 LAB — BASIC METABOLIC PANEL
Anion gap: 10 (ref 5–15)
BUN: 11 mg/dL (ref 6–20)
CO2: 23 mmol/L (ref 22–32)
CREATININE: 1.34 mg/dL — AB (ref 0.61–1.24)
Calcium: 9.2 mg/dL (ref 8.9–10.3)
Chloride: 104 mmol/L (ref 101–111)
GFR calc Af Amer: >60 mL/min (ref >60)
GLUCOSE: 177 mg/dL — AB (ref 65–99)
Potassium: 4 mmol/L (ref 3.5–5.1)
SODIUM: 137 mmol/L (ref 135–145)

## 2016-01-19 LAB — HEPARIN LEVEL (UNFRACTIONATED)
HEPARIN UNFRACTIONATED: 0.32 [IU]/mL (ref 0.30–0.70)
Heparin Unfractionated: 0.1 IU/mL — ABNORMAL LOW (ref 0.30–0.70)

## 2016-01-19 LAB — CBC
HCT: 34.6 % — ABNORMAL LOW (ref 39.0–52.0)
Hemoglobin: 11.4 g/dL — ABNORMAL LOW (ref 13.0–17.0)
MCH: 29.2 pg (ref 26.0–34.0)
MCHC: 32.9 g/dL (ref 30.0–36.0)
MCV: 88.5 fL (ref 78.0–100.0)
PLATELETS: 257 10*3/uL (ref 150–400)
RBC: 3.91 MIL/uL — ABNORMAL LOW (ref 4.22–5.81)
RDW: 12.8 % (ref 11.5–15.5)
WBC: 9.3 10*3/uL (ref 4.0–10.5)

## 2016-01-19 LAB — CK TOTAL AND CKMB (NOT AT ARMC)
CK TOTAL: 442 U/L — AB (ref 49–397)
CK, MB: 29.7 ng/mL — AB (ref 0.5–5.0)
Relative Index: 6.7 — ABNORMAL HIGH (ref 0.0–2.5)

## 2016-01-19 MED ORDER — HEPARIN BOLUS VIA INFUSION
3000.0000 [IU] | Freq: Once | INTRAVENOUS | Status: AC
  Administered 2016-01-19: 3000 [IU] via INTRAVENOUS
  Filled 2016-01-19: qty 3000

## 2016-01-19 NOTE — Progress Notes (Signed)
ANTICOAGULATION CONSULT NOTE - Initial Consult  Pharmacy Consult for heparin  Indication: chest pain/ACS  No Known Allergies  Patient Measurements: Height: 6' (182.9 cm) Weight: 240 lb (108.9 kg) IBW/kg (Calculated) : 77.6 Heparin Dosing Weight: 100 kg   Vital Signs: Temp: 98.7 F (37.1 C) (08/06 1200) Temp Source: Oral (08/06 1200) BP: 119/71 (08/06 1406) Pulse Rate: 69 (08/06 0600)  Labs:  Recent Labs  01/18/16 0834 01/18/16 1342 01/18/16 1949 01/19/16 0536 01/19/16 1417  HGB 13.1  --   --  11.4*  --   HCT 38.9*  --   --  34.6*  --   PLT 261  --  272 257  --   HEPARINUNFRC  --   --   --  <0.10* 0.10*  CREATININE 1.40*  --   --  1.34*  --   CKTOTAL  --  937* 763* 442*  --   CKMB  --  127.6* 87.4* 29.7*  --   TROPONINI  --  23.15*  --   --   --     Estimated Creatinine Clearance: 87.8 mL/min (by C-G formula based on SCr of 1.34 mg/dL).   Medical History: Past Medical History:  Diagnosis Date  . Allergy   . Anginal pain ([place])   . Hyperglycemia   . Hyperlipidemia    triglyceriedes   . Hypertriglyceridemia   . Obesity   . Sickle cell trait [facility])     Assessment: 45 yo male presents with chest pain and SOB. Code STEMI and to cath lab. No AC PTA, except 81 mg ASA.  Heparin gtt started at admission to ED for ACS, then stopped in cath lab. Mid to Dist RCA 100 % stenosed and DES placed.   HL now 0.1  Goal of Therapy:  Goal heparin level 0.3-0.5 while on Tirofiban  Goal heparin level 0.3-0.7 once Tirofiban discontinued (only on heparin)  Monitor platelets by anticoagulation protocol: Yes   Plan:  Heparin bolus 3000 units x 1 Increase heparin to 1850 units/hr Heparin level in 6 hours  Daily heparin level and CBC  Monitor for signs and symptoms of bleeding   Levester Fresh, PharmD, BCPS, [facility] Clinical Pharmacist Pager [phone] 01/19/2016 3:07 PM

## 2016-01-19 NOTE — Progress Notes (Signed)
ANTICOAGULATION CONSULT NOTE - f/u Consult  Pharmacy Consult for heparin  Indication: chest pain/ACS  No Known Allergies  Patient Measurements: Height: 6' (182.9 cm) Weight: 240 lb (108.9 kg) IBW/kg (Calculated) : 77.6 Heparin Dosing Weight: 100 kg   Vital Signs: Temp: 99.1 F (37.3 C) (08/06 1915) Temp Source: Oral (08/06 1915) BP: 115/70 (08/06 1915) Pulse Rate: 76 (08/06 1915)  Labs:  Recent Labs  01/18/16 0834 01/18/16 1342 01/18/16 1949 01/19/16 0536 01/19/16 1417 01/19/16 2139  HGB 13.1  --   --  11.4*  --   --   HCT 38.9*  --   --  34.6*  --   --   PLT 261  --  272 257  --   --   HEPARINUNFRC  --   --   --  <0.10* 0.10* 0.32  CREATININE 1.40*  --   --  1.34*  --   --   CKTOTAL  --  937* 763* 442*  --   --   CKMB  --  127.6* 87.4* 29.7*  --   --   TROPONINI  --  23.15*  --   --   --   --     Estimated Creatinine Clearance: 87.8 mL/min (by C-G formula based on SCr of 1.34 mg/dL).   Medical History: Past Medical History:  Diagnosis Date  . Allergy   . Anginal pain ([place])   . Hyperglycemia   . Hyperlipidemia    triglyceriedes   . Hypertriglyceridemia   . Obesity   . Sickle cell trait [facility])     Assessment: 45 yo male presents with chest pain and SOB. Code STEMI and to cath lab. No AC PTA, except 81 mg ASA.  Heparin gtt started at admission to ED for ACS, then stopped in cath lab. Mid to Dist RCA 100 % stenosed and DES placed.   PM HL 0.32 now in goal  Goal of Therapy:  Goal heparin level 0.3-0.5 while on Tirofiban  Goal heparin level 0.3-0.7 once Tirofiban discontinued (only on heparin)  Monitor platelets by anticoagulation protocol: Yes   Plan:  Heparin ag 1850 units/hr Daily heparin level and CBC  Monitor for signs and symptoms of bleeding   Vikas Wegmann S. Alford Highland, PharmD, [facility] Clinical Staff Pharmacist Pager [phone]  01/19/2016 10:06 PM

## 2016-01-19 NOTE — Progress Notes (Signed)
    Subjective:  Denies dyspnea; mild CP last PM   Objective:  Vitals:   01/19/16 0000 01/19/16 0200 01/19/16 0400 01/19/16 0600  BP: 114/71 107/66 121/76 118/75  Pulse: 86 82 75 69  Resp:      Temp:   99.3 F (37.4 C)   TempSrc:   Oral   SpO2: 94% 94% 95% 94%  Weight:      Height:        Intake/Output from previous day:  Intake/Output Summary (Last 24 hours) at 01/19/16 0758 Last data filed at 01/19/16 0600  Gross per 24 hour  Intake          1408.48 ml  Output              900 ml  Net           508.48 ml    Physical Exam: Physical exam: Well-developed well-nourished in no acute distress.  Skin is warm and dry.  HEENT is normal.  Neck is supple.  Chest is clear to auscultation with normal expansion.  Cardiovascular exam is regular rate and rhythm.  Abdominal exam nontender or distended. No masses palpated. Extremities show no edema. Radial cath site with no hematoma. neuro grossly intact    Lab Results: Basic Metabolic Panel:  Recent Labs  01/18/16 0834 01/19/16 0536  NA 138 137  K 3.9 4.0  CL 109 104  CO2 21* 23  GLUCOSE 210* 177*  BUN 14 11  CREATININE 1.40* 1.34*  CALCIUM 9.6 9.2   CBC:  Recent Labs  01/18/16 0834 01/18/16 1949 01/19/16 0536  WBC 8.2  --  9.3  NEUTROABS 4.9  --   --   HGB 13.1  --  11.4*  HCT 38.9*  --  34.6*  MCV 86.8  --  88.5  PLT 261 272 257   Cardiac Enzymes:  Recent Labs  01/18/16 1342 01/18/16 1949 01/19/16 0536  CKTOTAL 937* 763* 442*  CKMB 127.6* 87.4* 29.7*  TROPONINI 23.15*  --   --      Assessment/Plan:  45 year old male with hyperlipidemia presented with acute inferior lateral myocardial infarction. Patient is now status post PCI of his right coronary artery.  1 inferior lateral myocardial infarction-continue aspirin, brilinta, statin and metoprolol; heavy thrombus burden noted in RCA at time of intervention. Complete tirofiban and continue heparin. Per Dr. Irish Lack, reassess tomorrow morning  as patient may require relook catheterization to see if thrombus has resolved. Will keep nothing by mouth after midnight. Wean nitroglycerin to off. Note echocardiogram shows preserved LV function. 2 hyperlipidemia-continue Lipitor. Discontinue fenofibrate 3 Chronic stage III renal insufficiency-recheck renal function tomorrow morning.  Kirk Ruths 01/19/2016, 7:58 AM

## 2016-01-19 NOTE — Progress Notes (Signed)
ANTICOAGULATION CONSULT NOTE - Initial Consult  Pharmacy Consult for heparin  Indication: chest pain/ACS  No Known Allergies  Patient Measurements: Height: 6' (182.9 cm) Weight: 240 lb (108.9 kg) IBW/kg (Calculated) : 77.6 Heparin Dosing Weight: 100 kg   Vital Signs: Temp: 99.3 F (37.4 C) (08/06 0400) Temp Source: Oral (08/06 0400) BP: 118/75 (08/06 0600) Pulse Rate: 69 (08/06 0600)  Labs:  Recent Labs  01/18/16 0834 01/18/16 1342 01/18/16 1949 01/19/16 0536  HGB 13.1  --   --  11.4*  HCT 38.9*  --   --  34.6*  PLT 261  --  272 257  HEPARINUNFRC  --   --   --  <0.10*  CREATININE 1.40*  --   --   --   CKTOTAL  --  937* 763*  --   CKMB  --  127.6* 87.4*  --   TROPONINI  --  23.15*  --   --     Estimated Creatinine Clearance: 84 mL/min (by C-G formula based on SCr of 1.4 mg/dL).   Medical History: Past Medical History:  Diagnosis Date  . Allergy   . Anginal pain ([place])   . Hyperglycemia   . Hyperlipidemia    triglyceriedes   . Hypertriglyceridemia   . Obesity   . Sickle cell trait [facility])     Assessment: 45 yo male presents with chest pain and SOB. Code STEMI and to cath lab. No AC PTA, except 81 mg ASA.  Heparin gtt started at admission to ED for ACS, then stopped in cath lab. Mid to Dist RCA 100 % stenosed and DES placed.   Tirofiban continued for 18 hours post cath, to discontinue 8/6 at 0745 AM. Sheath pulled 8/5 in cath lab at 1300 PM. Started heparin gtt in 8 hours later at 2100 on 8/5. Per Dr. Irish Lack cath note - will continue heparin until Monday. Will need DAPT with ASA + Brilinta for one year.   Heparin level subtherapeutic at <0.10. Per nursing, no concerns with line or reported hold in heparin gtt overnight. Hgb 11.4, plt 257. Of note, patient with recent history of hemoptysis PTA. No overt signs or symptoms of bleeding at this time.   Goal of Therapy:  Goal heparin level 0.3-0.5 while on Tirofiban  Goal heparin level 0.3-0.7 once Tirofiban  discontinued (only on heparin)  Monitor platelets by anticoagulation protocol: Yes   Plan:  Increase heparin infusion to 1500 units/hr  Heparin level in 6 hours  Daily heparin level and CBC  Monitor for signs and symptoms of bleeding   Argie Ramming, PharmD Pharmacy Resident  Pager [phone] 01/19/16 7:21 AM

## 2016-01-20 ENCOUNTER — Ambulatory Visit: Payer: 59 | Admitting: Family Medicine

## 2016-01-20 ENCOUNTER — Encounter (HOSPITAL_COMMUNITY): Payer: Self-pay | Admitting: Interventional Cardiology

## 2016-01-20 LAB — BASIC METABOLIC PANEL
ANION GAP: 6 (ref 5–15)
BUN: 11 mg/dL (ref 6–20)
CHLORIDE: 106 mmol/L (ref 101–111)
CO2: 22 mmol/L (ref 22–32)
CREATININE: 1.41 mg/dL — AB (ref 0.61–1.24)
Calcium: 9.1 mg/dL (ref 8.9–10.3)
GFR calc non Af Amer: 58 mL/min — ABNORMAL LOW (ref >60)
Glucose, Bld: 152 mg/dL — ABNORMAL HIGH (ref 65–99)
POTASSIUM: 3.9 mmol/L (ref 3.5–5.1)
Sodium: 134 mmol/L — ABNORMAL LOW (ref 135–145)

## 2016-01-20 LAB — CBC
HEMATOCRIT: 35.1 % — AB (ref 39.0–52.0)
HEMOGLOBIN: 11.6 g/dL — AB (ref 13.0–17.0)
MCH: 28.7 pg (ref 26.0–34.0)
MCHC: 33 g/dL (ref 30.0–36.0)
MCV: 86.9 fL (ref 78.0–100.0)
Platelets: 244 10*3/uL (ref 150–400)
RBC: 4.04 MIL/uL — AB (ref 4.22–5.81)
RDW: 12.6 % (ref 11.5–15.5)
WBC: 9.7 10*3/uL (ref 4.0–10.5)

## 2016-01-20 LAB — GLUCOSE, CAPILLARY: GLUCOSE-CAPILLARY: 170 mg/dL — AB (ref 65–99)

## 2016-01-20 LAB — HEPARIN LEVEL (UNFRACTIONATED): HEPARIN UNFRACTIONATED: 0.34 [IU]/mL (ref 0.30–0.70)

## 2016-01-20 MED ORDER — TICAGRELOR 90 MG PO TABS: 90.0000 mg | ORAL_TABLET | Freq: Two times a day (BID) | ORAL | Status: DC

## 2016-01-20 MED ORDER — SODIUM CHLORIDE 0.9 % IV SOLN
INTRAVENOUS | Status: DC
  Administered 2016-01-20: 20:00:00 via INTRAVENOUS

## 2016-01-20 MED ORDER — ASPIRIN 81 MG PO CHEW
81.0000 mg | CHEWABLE_TABLET | ORAL | Status: AC
  Administered 2016-01-21: 81 mg via ORAL
  Filled 2016-01-20: qty 1

## 2016-01-20 MED ORDER — SODIUM CHLORIDE 0.9 % IV SOLN
INTRAVENOUS | Status: DC
Start: 2016-01-21 — End: 2016-01-20
  Administered 2016-01-21: 250 mL via INTRAVENOUS
  Administered 2016-01-21: 09:00:00 via INTRAVENOUS

## 2016-01-20 MED ORDER — SODIUM CHLORIDE 0.9% FLUSH: 3.0000 mL | INTRAVENOUS | Status: DC | PRN

## 2016-01-20 MED ORDER — SODIUM CHLORIDE 0.9% FLUSH: 3.0000 mL | Freq: Two times a day (BID) | INTRAVENOUS | Status: DC

## 2016-01-20 MED ORDER — SODIUM CHLORIDE 0.9 % IV SOLN: 250.0000 mL | INTRAVENOUS | Status: DC | PRN

## 2016-01-20 NOTE — Progress Notes (Signed)
CARDIAC REHAB PHASE I   PRE:  Rate/Rhythm: 86 SR  BP:  Supine:   Sitting:   Standing: 123/85   SaO2:   MODE:  Ambulation: 700 ft   POST:  Rate/Rhythm: 93 SR  BP:  Supine:   Sitting: 126/75  Standing:    SaO2: 97%RA 1000-1110 Pt walked 700 ft with steady gait. No CP. Tolerated well. MI education completed except for ex ed. Discussed MI restrictions, NTG use, stent and importance of brilinta, smoking cessation, heart healthy diet choices, and CRP 2. Pt's A1C pending but did address low carb vs high carb foods. Pt stated he quit cigars about 2 months ago and does not plan on restarting. Discussed CRP 2 and will refer to [place].    Graylon Good, RN BSN  01/20/2016 11:08 AM

## 2016-01-20 NOTE — Progress Notes (Signed)
ANTICOAGULATION CONSULT NOTE - f/u Consult  Pharmacy Consult for heparin  Indication: chest pain/ACS  No Known Allergies  Patient Measurements: Height: 6' (182.9 cm) Weight: 240 lb (108.9 kg) IBW/kg (Calculated) : 77.6 Heparin Dosing Weight: 100 kg   Vital Signs: Temp: 99.3 F (37.4 C) (08/07 0355) Temp Source: Oral (08/07 0355) BP: 108/69 (08/07 0355) Pulse Rate: 80 (08/07 0355)  Labs:  Recent Labs  01/18/16 0834 01/18/16 1342 01/18/16 1949  01/19/16 0536 01/19/16 1417 01/19/16 2139 01/20/16 0248  HGB 13.1  --   --   --  11.4*  --   --  11.6*  HCT 38.9*  --   --   --  34.6*  --   --  35.1*  PLT 261  --  272  --  257  --   --  244  HEPARINUNFRC  --   --   --   < > <0.10* 0.10* 0.32 0.34  CREATININE 1.40*  --   --   --  1.34*  --   --  1.41*  CKTOTAL  --  937* 763*  --  442*  --   --   --   CKMB  --  127.6* 87.4*  --  29.7*  --   --   --   TROPONINI  --  23.15*  --   --   --   --   --   --   < > = values in this interval not displayed.  Estimated Creatinine Clearance: 83.4 mL/min (by C-G formula based on SCr of 1.41 mg/dL).  Assessment: 45 yo male presents with chest pain and SOB. Code STEMI and to cath lab. No AC PTA, except 81 mg ASA.  Heparin gtt started at admission to ED for ACS, then stopped in cath lab. Mid to Dist RCA 100 % stenosed and DES placed. Possible re-look cath today. HL at goal this am, Hgb 11.6, PLT 244.  Goal of Therapy:  Goal heparin level 0.3-0.5 while on Tirofiban  Goal heparin level 0.3-0.7 once Tirofiban discontinued (only on heparin)  Monitor platelets by anticoagulation protocol: Yes   Plan:  Heparin ag 1850 units/hr Daily heparin level and CBC  Monitor for signs and symptoms of bleeding   Melburn Popper, PharmD Clinical Pharmacy Resident Pager: [phone] 01/20/16 7:29 AM

## 2016-01-20 NOTE — Progress Notes (Signed)
Subjective:  POD #2 Inferior STEMI with large thrombus burden s/p DES and aspiration thrombectomy. No CP/SOB  Objective:  Temp:  [98.6 F (37 C)-99.3 F (37.4 C)] 98.6 F (37 C) (08/07 0800) Pulse Rate:  [72-80] 72 (08/07 0800) Resp:  [14-20] 14 (08/07 0800) BP: (103-120)/(68-81) 111/76 (08/07 0800) SpO2:  [95 %-96 %] 96 % (08/07 0800) Weight change:   Intake/Output from previous day: 08/06 0701 - 08/07 0700 In: 2202.8 [P.O.:1800; I.V.:402.8] Out: -   Intake/Output from this shift: No intake/output data recorded.  Physical Exam: General appearance: alert and no distress Neck: no adenopathy, no carotid bruit, no JVD, supple, symmetrical, trachea midline and thyroid not enlarged, symmetric, no tenderness/mass/nodules Lungs: clear to auscultation bilaterally Heart: regular rate and rhythm, S1, S2 normal, no murmur, click, rub or gallop Extremities: extremities normal, atraumatic, no cyanosis or edema  Lab Results: Results for orders placed or performed during the hospital encounter of 01/18/16 (from the past 48 hour(s))  I-stat troponin, ED     Status: Abnormal   Collection Time: 01/18/16  8:45 AM  Result Value Ref Range   Troponin i, poc 1.10 (HH) 0.00 - 0.08 ng/mL   Comment NOTIFIED PHYSICIAN    Comment 3            Comment: Due to the release kinetics of cTnI, a negative result within the first hours of the onset of symptoms does not rule out myocardial infarction with certainty. If myocardial infarction is still suspected, repeat the test at appropriate intervals.   I-Stat CG4 Lactic Acid, ED     Status: None   Collection Time: 01/18/16  8:47 AM  Result Value Ref Range   Lactic Acid, Venous 1.45 0.5 - 1.9 mmol/L  POCT Activated clotting time     Status: None   Collection Time: 01/18/16 11:54 AM  Result Value Ref Range   Activated Clotting Time 368 seconds  MRSA PCR Screening     Status: None   Collection Time: 01/18/16  1:19 PM  Result Value Ref Range     MRSA by PCR NEGATIVE NEGATIVE    Comment:        The GeneXpert MRSA Assay (FDA approved for NASAL specimens only), is one component of a comprehensive MRSA colonization surveillance program. It is not intended to diagnose MRSA infection nor to guide or monitor treatment for MRSA infections.   Troponin I (serum)     Status: Abnormal   Collection Time: 01/18/16  1:42 PM  Result Value Ref Range   Troponin I 23.15 (HH) <0.03 ng/mL    Comment: CRITICAL RESULT CALLED TO, READ BACK BY AND VERIFIED WITH: C.BARLOW,RN 01/18/16 @1457  BY V.WILKINS   CK total and CKMB (cardiac)not at [facility]     Status: Abnormal   Collection Time: 01/18/16  1:42 PM  Result Value Ref Range   Total CK 937 (H) 49 - 397 U/L   CK, MB 127.6 (H) 0.5 - 5.0 ng/mL   Relative Index 13.6 (H) 0.0 - 2.5  CK total and CKMB (cardiac)not at [facility]     Status: Abnormal   Collection Time: 01/18/16  7:49 PM  Result Value Ref Range   Total CK 763 (H) 49 - 397 U/L   CK, MB 87.4 (H) 0.5 - 5.0 ng/mL   Relative Index 11.5 (H) 0.0 - 2.5  Platelet count     Status: None   Collection Time: 01/18/16  7:49 PM  Result Value Ref Range   Platelets 272  150 - 400 K/uL  CK total and CKMB (cardiac)not at [facility]     Status: Abnormal   Collection Time: 01/19/16  5:36 AM  Result Value Ref Range   Total CK 442 (H) 49 - 397 U/L   CK, MB 29.7 (H) 0.5 - 5.0 ng/mL   Relative Index 6.7 (H) 0.0 - 2.5  Heparin level (unfractionated)     Status: Abnormal   Collection Time: 01/19/16  5:36 AM  Result Value Ref Range   Heparin Unfractionated <0.10 (L) 0.30 - 0.70 IU/mL    Comment:        IF HEPARIN RESULTS ARE BELOW EXPECTED VALUES, AND PATIENT DOSAGE HAS BEEN CONFIRMED, SUGGEST FOLLOW UP TESTING OF ANTITHROMBIN III LEVELS.   Basic metabolic panel     Status: Abnormal   Collection Time: 01/19/16  5:36 AM  Result Value Ref Range   Sodium 137 135 - 145 mmol/L   Potassium 4.0 3.5 - 5.1 mmol/L   Chloride 104 101 - 111 mmol/L   CO2 23 22 - 32  mmol/L   Glucose, Bld 177 (H) 65 - 99 mg/dL   BUN 11 6 - 20 mg/dL   Creatinine, Ser 1.34 (H) 0.61 - 1.24 mg/dL   Calcium 9.2 8.9 - 10.3 mg/dL   GFR calc non Af Amer >60 >60 mL/min   GFR calc Af Amer >60 >60 mL/min    Comment: (NOTE) The eGFR has been calculated using the CKD EPI equation. This calculation has not been validated in all clinical situations. eGFR's persistently <60 mL/min signify possible Chronic Kidney Disease.    Anion gap 10 5 - 15  CBC     Status: Abnormal   Collection Time: 01/19/16  5:36 AM  Result Value Ref Range   WBC 9.3 4.0 - 10.5 K/uL   RBC 3.91 (L) 4.22 - 5.81 MIL/uL   Hemoglobin 11.4 (L) 13.0 - 17.0 g/dL   HCT 34.6 (L) 39.0 - 52.0 %   MCV 88.5 78.0 - 100.0 fL   MCH 29.2 26.0 - 34.0 pg   MCHC 32.9 30.0 - 36.0 g/dL   RDW 12.8 11.5 - 15.5 %   Platelets 257 150 - 400 K/uL  Heparin level (unfractionated)     Status: Abnormal   Collection Time: 01/19/16  2:17 PM  Result Value Ref Range   Heparin Unfractionated 0.10 (L) 0.30 - 0.70 IU/mL    Comment:        IF HEPARIN RESULTS ARE BELOW EXPECTED VALUES, AND PATIENT DOSAGE HAS BEEN CONFIRMED, SUGGEST FOLLOW UP TESTING OF ANTITHROMBIN III LEVELS.   Heparin level (unfractionated)     Status: None   Collection Time: 01/19/16  9:39 PM  Result Value Ref Range   Heparin Unfractionated 0.32 0.30 - 0.70 IU/mL    Comment:        IF HEPARIN RESULTS ARE BELOW EXPECTED VALUES, AND PATIENT DOSAGE HAS BEEN CONFIRMED, SUGGEST FOLLOW UP TESTING OF ANTITHROMBIN III LEVELS.   Heparin level (unfractionated)     Status: None   Collection Time: 01/20/16  2:48 AM  Result Value Ref Range   Heparin Unfractionated 0.34 0.30 - 0.70 IU/mL    Comment:        IF HEPARIN RESULTS ARE BELOW EXPECTED VALUES, AND PATIENT DOSAGE HAS BEEN CONFIRMED, SUGGEST FOLLOW UP TESTING OF ANTITHROMBIN III LEVELS.   CBC     Status: Abnormal   Collection Time: 01/20/16  2:48 AM  Result Value Ref Range   WBC 9.7 4.0 - 10.5  K/uL   RBC  4.04 (L) 4.22 - 5.81 MIL/uL   Hemoglobin 11.6 (L) 13.0 - 17.0 g/dL   HCT 35.1 (L) 39.0 - 52.0 %   MCV 86.9 78.0 - 100.0 fL   MCH 28.7 26.0 - 34.0 pg   MCHC 33.0 30.0 - 36.0 g/dL   RDW 12.6 11.5 - 15.5 %   Platelets 244 150 - 400 K/uL  Basic metabolic panel     Status: Abnormal   Collection Time: 01/20/16  2:48 AM  Result Value Ref Range   Sodium 134 (L) 135 - 145 mmol/L   Potassium 3.9 3.5 - 5.1 mmol/L   Chloride 106 101 - 111 mmol/L   CO2 22 22 - 32 mmol/L   Glucose, Bld 152 (H) 65 - 99 mg/dL   BUN 11 6 - 20 mg/dL   Creatinine, Ser 1.41 (H) 0.61 - 1.24 mg/dL   Calcium 9.1 8.9 - 10.3 mg/dL   GFR calc non Af Amer 58 (L) >60 mL/min   GFR calc Af Amer >60 >60 mL/min    Comment: (NOTE) The eGFR has been calculated using the CKD EPI equation. This calculation has not been validated in all clinical situations. eGFR's persistently <60 mL/min signify possible Chronic Kidney Disease.    Anion gap 6 5 - 15  Glucose, capillary     Status: Abnormal   Collection Time: 01/20/16  8:13 AM  Result Value Ref Range   Glucose-Capillary 170 (H) 65 - 99 mg/dL   Comment 1 Capillary Specimen     Imaging: Imaging results have been reviewed  Tele- NSR  Assessment/Plan:   1. Active Problems: 2.   NSTEMI (non-ST elevated myocardial infarction) ([place]) 3.   Acute MI inferior lateral first episode care ([place]) 4.   Time Spent Directly with Patient:  20 minutes  Length of Stay:  LOS: 2 days   POD #2 Inferior STEMI Rx with aspiration thrombectomy, PCI and DES large RCA. Nl LV by 2D. Had 18 hours of Aggrastat, on IV hep, BB, statin. On DAPT (Brilenta). No CP. Continues on IV hep. I favor relook cath tomorrow to assess resolution of thrombus before D/C IV hep.   Quay Burow 01/20/2016, 8:36 AM

## 2016-01-20 NOTE — Care Management Note (Addendum)
Case Management Note  Patient Details  Name: Andrew Burgess MRN: [id number] Date of Birth: [birth year]  Subjective/Objective:    Adm w mi                Action/Plan: lives at home   Expected Discharge Date:  01/21/16               Expected Discharge Plan:  Home/Self Care  In-House Referral:     Discharge planning Services  CM Consult, Medication Assistance  Post Acute Care Choice:    Choice offered to:     DME Arranged:    DME Agency:     HH Arranged:    HH Agency:     Status of Service:     If discussed at [employer], dates discussed:    Additional Comments: gave pt 30day free and copay card for brilinta. [facility] ins. S/W NICOLE @ [employer] [facility] # [phone]   BRILINTA 90 MG BID (30 )  COVER- YES  CO-PAY- $50.00  60 TAB  TIER- 3 DRUG  PRIOR APPROVAL - NO  PHARMACY : [facility], [facility] AND Shawnie Pons, RN 01/20/2016, 10:10 AM

## 2016-01-21 ENCOUNTER — Encounter (HOSPITAL_COMMUNITY)
Admission: EM | Disposition: A | Discharge status: Home / Self Care | Payer: Self-pay | Source: Home / Self Care | Attending: Interventional Cardiology

## 2016-01-21 DIAGNOSIS — I25110 Atherosclerotic heart disease of native coronary artery with unstable angina pectoris: Secondary | ICD-10-CM

## 2016-01-21 DIAGNOSIS — Z955 Presence of coronary angioplasty implant and graft: Secondary | ICD-10-CM

## 2016-01-21 HISTORY — PX: CARDIAC CATHETERIZATION: SHX172

## 2016-01-21 LAB — LIPID PANEL
CHOLESTEROL: 204 mg/dL — AB (ref 0–200)
HDL: 23 mg/dL — ABNORMAL LOW (ref >40)
LDL Cholesterol: UNDETERMINED mg/dL (ref 0–99)
Total CHOL/HDL Ratio: 8.9 RATIO
Triglycerides: 470 mg/dL — ABNORMAL HIGH (ref <150)
VLDL: UNDETERMINED mg/dL (ref 0–40)

## 2016-01-21 LAB — BASIC METABOLIC PANEL
ANION GAP: 9 (ref 5–15)
BUN: 13 mg/dL (ref 6–20)
CHLORIDE: 103 mmol/L (ref 101–111)
CO2: 22 mmol/L (ref 22–32)
Calcium: 9.3 mg/dL (ref 8.9–10.3)
Creatinine, Ser: 1.37 mg/dL — ABNORMAL HIGH (ref 0.61–1.24)
GFR calc non Af Amer: >60 mL/min (ref >60)
Glucose, Bld: 171 mg/dL — ABNORMAL HIGH (ref 65–99)
POTASSIUM: 4 mmol/L (ref 3.5–5.1)
Sodium: 134 mmol/L — ABNORMAL LOW (ref 135–145)

## 2016-01-21 LAB — HCG, SERUM, QUALITATIVE: Preg, Serum: NEGATIVE

## 2016-01-21 LAB — HEPARIN LEVEL (UNFRACTIONATED): HEPARIN UNFRACTIONATED: 0.44 [IU]/mL (ref 0.30–0.70)

## 2016-01-21 LAB — CBC
HCT: 35.3 % — ABNORMAL LOW (ref 39.0–52.0)
Hemoglobin: 11.9 g/dL — ABNORMAL LOW (ref 13.0–17.0)
MCH: 29.2 pg (ref 26.0–34.0)
MCHC: 33.7 g/dL (ref 30.0–36.0)
MCV: 86.7 fL (ref 78.0–100.0)
PLATELETS: 264 10*3/uL (ref 150–400)
RBC: 4.07 MIL/uL — ABNORMAL LOW (ref 4.22–5.81)
RDW: 12.7 % (ref 11.5–15.5)
WBC: 10.6 10*3/uL — ABNORMAL HIGH (ref 4.0–10.5)

## 2016-01-21 LAB — POCT ACTIVATED CLOTTING TIME: ACTIVATED CLOTTING TIME: 505 s

## 2016-01-21 SURGERY — LEFT HEART CATH AND CORONARY ANGIOGRAPHY

## 2016-01-21 MED ORDER — ASPIRIN 81 MG PO CHEW
81.0000 mg | CHEWABLE_TABLET | Freq: Every day | ORAL | Status: DC
  Administered 2016-01-22 – 2016-01-23 (×2): 81 mg via ORAL
  Filled 2016-01-21 (×2): qty 1

## 2016-01-21 MED ORDER — VERAPAMIL HCL 2.5 MG/ML IV SOLN
INTRAVENOUS | Status: DC | PRN
  Administered 2016-01-21: 5 mL via INTRA_ARTERIAL

## 2016-01-21 MED ORDER — VERAPAMIL HCL 2.5 MG/ML IV SOLN
INTRAVENOUS | Status: AC
  Filled 2016-01-21: qty 2

## 2016-01-21 MED ORDER — VERAPAMIL HCL 2.5 MG/ML IV SOLN
INTRA_ARTERIAL | Status: DC | PRN
  Administered 2016-01-21: 10 mL via INTRA_ARTERIAL

## 2016-01-21 MED ORDER — MIDAZOLAM HCL 2 MG/2ML IJ SOLN
INTRAMUSCULAR | Status: AC
  Filled 2016-01-21: qty 2

## 2016-01-21 MED ORDER — HEPARIN (PORCINE) IN NACL 2-0.9 UNIT/ML-% IJ SOLN
INTRAMUSCULAR | Status: DC | PRN
  Administered 2016-01-21: 1000 mL

## 2016-01-21 MED ORDER — IOPAMIDOL (ISOVUE-370) INJECTION 76%
INTRAVENOUS | Status: AC
  Filled 2016-01-21: qty 100

## 2016-01-21 MED ORDER — ATORVASTATIN CALCIUM 80 MG PO TABS
80.0000 mg | ORAL_TABLET | Freq: Every day | ORAL | Status: DC
  Administered 2016-01-21 – 2016-01-22 (×2): 80 mg via ORAL
  Filled 2016-01-21 (×3): qty 1

## 2016-01-21 MED ORDER — SODIUM CHLORIDE 0.9 % IV SOLN
250.0000 mL | INTRAVENOUS | Status: DC | PRN
Start: 2016-01-21 — End: 2016-01-22

## 2016-01-21 MED ORDER — BIVALIRUDIN BOLUS VIA INFUSION - CUPID
INTRAVENOUS | Status: DC | PRN
  Administered 2016-01-21: 81.375 mg via INTRAVENOUS

## 2016-01-21 MED ORDER — ONDANSETRON HCL 4 MG/2ML IJ SOLN: 4.0000 mg | Freq: Four times a day (QID) | INTRAMUSCULAR | Status: DC | PRN

## 2016-01-21 MED ORDER — LIDOCAINE HCL (PF) 1 % IJ SOLN
INTRAMUSCULAR | Status: DC | PRN
  Administered 2016-01-21: 3 mL

## 2016-01-21 MED ORDER — HEPARIN (PORCINE) IN NACL 2-0.9 UNIT/ML-% IJ SOLN
INTRAMUSCULAR | Status: AC
  Filled 2016-01-21: qty 1500

## 2016-01-21 MED ORDER — BIVALIRUDIN 250 MG IV SOLR
INTRAVENOUS | Status: AC
  Filled 2016-01-21: qty 250

## 2016-01-21 MED ORDER — DIAZEPAM 5 MG PO TABS: 5.0000 mg | ORAL_TABLET | Freq: Four times a day (QID) | ORAL | Status: DC | PRN

## 2016-01-21 MED ORDER — TICAGRELOR 90 MG PO TABS
90.0000 mg | ORAL_TABLET | Freq: Two times a day (BID) | ORAL | Status: DC
  Administered 2016-01-21 – 2016-01-23 (×4): 90 mg via ORAL
  Filled 2016-01-21 (×4): qty 1

## 2016-01-21 MED ORDER — HEPARIN SODIUM (PORCINE) 1000 UNIT/ML IJ SOLN
INTRAMUSCULAR | Status: DC | PRN
  Administered 2016-01-21: 5000 [IU] via INTRAVENOUS

## 2016-01-21 MED ORDER — SODIUM CHLORIDE 0.9 % IV SOLN
INTRAVENOUS | Status: DC | PRN
  Administered 2016-01-21: 1.75 mg/kg/h via INTRAVENOUS
  Administered 2016-01-21: 09:00:00

## 2016-01-21 MED ORDER — LIDOCAINE HCL (PF) 1 % IJ SOLN
INTRAMUSCULAR | Status: AC
  Filled 2016-01-21: qty 30

## 2016-01-21 MED ORDER — FENTANYL CITRATE (PF) 100 MCG/2ML IJ SOLN
INTRAMUSCULAR | Status: AC
  Filled 2016-01-21: qty 2

## 2016-01-21 MED ORDER — HEPARIN SODIUM (PORCINE) 1000 UNIT/ML IJ SOLN
INTRAMUSCULAR | Status: AC
  Filled 2016-01-21: qty 1

## 2016-01-21 MED ORDER — IOPAMIDOL (ISOVUE-370) INJECTION 76%
INTRAVENOUS | Status: DC | PRN
  Administered 2016-01-21: 350 mL via INTRAVENOUS

## 2016-01-21 MED ORDER — SODIUM CHLORIDE 0.9 % IV SOLN
1.7500 mg/kg/h | INTRAVENOUS | Status: AC
  Administered 2016-01-21: 1.75 mg/kg/h via INTRAVENOUS
  Filled 2016-01-21 (×2): qty 250

## 2016-01-21 MED ORDER — SODIUM CHLORIDE 0.9 % IV SOLN
INTRAVENOUS | Status: DC
  Administered 2016-01-21: 13:00:00 via INTRAVENOUS

## 2016-01-21 MED ORDER — IOPAMIDOL (ISOVUE-370) INJECTION 76%
INTRAVENOUS | Status: AC
  Filled 2016-01-21: qty 150

## 2016-01-21 MED ORDER — MIDAZOLAM HCL 2 MG/2ML IJ SOLN
INTRAMUSCULAR | Status: DC | PRN
  Administered 2016-01-21 (×5): 1 mg via INTRAVENOUS

## 2016-01-21 MED ORDER — NITROGLYCERIN 1 MG/10 ML FOR IR/CATH LAB
INTRA_ARTERIAL | Status: AC
  Filled 2016-01-21: qty 10

## 2016-01-21 MED ORDER — SODIUM CHLORIDE 0.9% FLUSH: 3.0000 mL | Freq: Two times a day (BID) | INTRAVENOUS | Status: DC

## 2016-01-21 MED ORDER — FENTANYL CITRATE (PF) 100 MCG/2ML IJ SOLN
INTRAMUSCULAR | Status: DC | PRN
  Administered 2016-01-21 (×5): 25 ug via INTRAVENOUS

## 2016-01-21 MED ORDER — SODIUM CHLORIDE 0.9% FLUSH: 3.0000 mL | INTRAVENOUS | Status: DC | PRN

## 2016-01-21 MED ORDER — ACETAMINOPHEN 325 MG PO TABS
650.0000 mg | ORAL_TABLET | ORAL | Status: DC | PRN
  Administered 2016-01-21 (×2): 650 mg via ORAL
  Filled 2016-01-21: qty 2

## 2016-01-21 MED ORDER — NITROGLYCERIN 1 MG/10 ML FOR IR/CATH LAB
INTRA_ARTERIAL | Status: DC | PRN
  Administered 2016-01-21 (×2): 200 ug via INTRACORONARY
  Administered 2016-01-21: 100 ug via INTRACORONARY

## 2016-01-21 MED ORDER — IOPAMIDOL (ISOVUE-370) INJECTION 76%
INTRAVENOUS | Status: AC
  Filled 2016-01-21: qty 50

## 2016-01-21 SURGICAL SUPPLY — 24 items
BALLN EMERGE MR 2.5X12 (BALLOONS) ×3
BALLN ~~LOC~~ EMERGE MR 3.25X8 (BALLOONS) ×3
BALLN ~~LOC~~ EMERGE MR 3.75X15 (BALLOONS) ×3
BALLN ~~LOC~~ EUPHORA RX 3.5X15 (BALLOONS) ×3
BALLOON EMERGE MR 2.5X12 (BALLOONS) IMPLANT
BALLOON ~~LOC~~ EMERGE MR 3.25X8 (BALLOONS) IMPLANT
BALLOON ~~LOC~~ EMERGE MR 3.75X15 (BALLOONS) IMPLANT
BALLOON ~~LOC~~ EUPHORA RX 3.5X15 (BALLOONS) IMPLANT
CATH INFINITI 5 FR AR1 MOD (CATHETERS) ×2 IMPLANT
CATH INFINITI JR4 5F (CATHETERS) ×2 IMPLANT
DEVICE RAD COMP TR BAND LRG (VASCULAR PRODUCTS) ×2 IMPLANT
GLIDESHEATH SLEND SS 6F .021 (SHEATH) ×2 IMPLANT
GUIDE CATH RUNWAY 6FR MP1 (CATHETERS) ×2 IMPLANT
KIT ENCORE 26 ADVANTAGE (KITS) ×2 IMPLANT
KIT HEART LEFT (KITS) ×3 IMPLANT
PACK CARDIAC CATHETERIZATION (CUSTOM PROCEDURE TRAY) ×3 IMPLANT
STENT SYNERGY DES 3.5X20 (Permanent Stent) ×2 IMPLANT
STENT SYNERGY DES 3X12 (Permanent Stent) ×2 IMPLANT
TRANSDUCER W/STOPCOCK (MISCELLANEOUS) ×3 IMPLANT
TUBING CIL FLEX 10 FLL-RA (TUBING) ×3 IMPLANT
WIRE ASAHI MEDIUM 180CM (WIRE) ×2 IMPLANT
WIRE HI TORQ BMW 190CM (WIRE) ×4 IMPLANT
WIRE HI TORQ VERSACORE-J 145CM (WIRE) ×2 IMPLANT
WIRE SAFE-T 1.5MM-J .035X260CM (WIRE) ×2 IMPLANT

## 2016-01-21 NOTE — H&P (View-Only) (Signed)
Subjective:  POD #2 Inferior STEMI with large thrombus burden s/p DES and aspiration thrombectomy. No CP/SOB  Objective:  Temp:  [98.6 F (37 C)-99.3 F (37.4 C)] 98.6 F (37 C) (08/07 0800) Pulse Rate:  [72-80] 72 (08/07 0800) Resp:  [14-20] 14 (08/07 0800) BP: (103-120)/(68-81) 111/76 (08/07 0800) SpO2:  [95 %-96 %] 96 % (08/07 0800) Weight change:   Intake/Output from previous day: 08/06 0701 - 08/07 0700 In: 2202.8 [P.O.:1800; I.V.:402.8] Out: -   Intake/Output from this shift: No intake/output data recorded.  Physical Exam: General appearance: alert and no distress Neck: no adenopathy, no carotid bruit, no JVD, supple, symmetrical, trachea midline and thyroid not enlarged, symmetric, no tenderness/mass/nodules Lungs: clear to auscultation bilaterally Heart: regular rate and rhythm, S1, S2 normal, no murmur, click, rub or gallop Extremities: extremities normal, atraumatic, no cyanosis or edema  Lab Results: Results for orders placed or performed during the hospital encounter of 01/18/16 (from the past 48 hour(s))  I-stat troponin, ED     Status: Abnormal   Collection Time: 01/18/16  8:45 AM  Result Value Ref Range   Troponin i, poc 1.10 (HH) 0.00 - 0.08 ng/mL   Comment NOTIFIED PHYSICIAN    Comment 3            Comment: Due to the release kinetics of cTnI, a negative result within the first hours of the onset of symptoms does not rule out myocardial infarction with certainty. If myocardial infarction is still suspected, repeat the test at appropriate intervals.   I-Stat CG4 Lactic Acid, ED     Status: None   Collection Time: 01/18/16  8:47 AM  Result Value Ref Range   Lactic Acid, Venous 1.45 0.5 - 1.9 mmol/L  POCT Activated clotting time     Status: None   Collection Time: 01/18/16 11:54 AM  Result Value Ref Range   Activated Clotting Time 368 seconds  MRSA PCR Screening     Status: None   Collection Time: 01/18/16  1:19 PM  Result Value Ref Range     MRSA by PCR NEGATIVE NEGATIVE    Comment:        The GeneXpert MRSA Assay (FDA approved for NASAL specimens only), is one component of a comprehensive MRSA colonization surveillance program. It is not intended to diagnose MRSA infection nor to guide or monitor treatment for MRSA infections.   Troponin I (serum)     Status: Abnormal   Collection Time: 01/18/16  1:42 PM  Result Value Ref Range   Troponin I 23.15 (HH) <0.03 ng/mL    Comment: CRITICAL RESULT CALLED TO, READ BACK BY AND VERIFIED WITH: C.BARLOW,RN 01/18/16 @1457  BY V.WILKINS   CK total and CKMB (cardiac)not at [facility]     Status: Abnormal   Collection Time: 01/18/16  1:42 PM  Result Value Ref Range   Total CK 937 (H) 49 - 397 U/L   CK, MB 127.6 (H) 0.5 - 5.0 ng/mL   Relative Index 13.6 (H) 0.0 - 2.5  CK total and CKMB (cardiac)not at [facility]     Status: Abnormal   Collection Time: 01/18/16  7:49 PM  Result Value Ref Range   Total CK 763 (H) 49 - 397 U/L   CK, MB 87.4 (H) 0.5 - 5.0 ng/mL   Relative Index 11.5 (H) 0.0 - 2.5  Platelet count     Status: None   Collection Time: 01/18/16  7:49 PM  Result Value Ref Range   Platelets 272  150 - 400 K/uL  CK total and CKMB (cardiac)not at [facility]     Status: Abnormal   Collection Time: 01/19/16  5:36 AM  Result Value Ref Range   Total CK 442 (H) 49 - 397 U/L   CK, MB 29.7 (H) 0.5 - 5.0 ng/mL   Relative Index 6.7 (H) 0.0 - 2.5  Heparin level (unfractionated)     Status: Abnormal   Collection Time: 01/19/16  5:36 AM  Result Value Ref Range   Heparin Unfractionated <0.10 (L) 0.30 - 0.70 IU/mL    Comment:        IF HEPARIN RESULTS ARE BELOW EXPECTED VALUES, AND PATIENT DOSAGE HAS BEEN CONFIRMED, SUGGEST FOLLOW UP TESTING OF ANTITHROMBIN III LEVELS.   Basic metabolic panel     Status: Abnormal   Collection Time: 01/19/16  5:36 AM  Result Value Ref Range   Sodium 137 135 - 145 mmol/L   Potassium 4.0 3.5 - 5.1 mmol/L   Chloride 104 101 - 111 mmol/L   CO2 23 22 - 32  mmol/L   Glucose, Bld 177 (H) 65 - 99 mg/dL   BUN 11 6 - 20 mg/dL   Creatinine, Ser 1.34 (H) 0.61 - 1.24 mg/dL   Calcium 9.2 8.9 - 10.3 mg/dL   GFR calc non Af Amer >60 >60 mL/min   GFR calc Af Amer >60 >60 mL/min    Comment: (NOTE) The eGFR has been calculated using the CKD EPI equation. This calculation has not been validated in all clinical situations. eGFR's persistently <60 mL/min signify possible Chronic Kidney Disease.    Anion gap 10 5 - 15  CBC     Status: Abnormal   Collection Time: 01/19/16  5:36 AM  Result Value Ref Range   WBC 9.3 4.0 - 10.5 K/uL   RBC 3.91 (L) 4.22 - 5.81 MIL/uL   Hemoglobin 11.4 (L) 13.0 - 17.0 g/dL   HCT 34.6 (L) 39.0 - 52.0 %   MCV 88.5 78.0 - 100.0 fL   MCH 29.2 26.0 - 34.0 pg   MCHC 32.9 30.0 - 36.0 g/dL   RDW 12.8 11.5 - 15.5 %   Platelets 257 150 - 400 K/uL  Heparin level (unfractionated)     Status: Abnormal   Collection Time: 01/19/16  2:17 PM  Result Value Ref Range   Heparin Unfractionated 0.10 (L) 0.30 - 0.70 IU/mL    Comment:        IF HEPARIN RESULTS ARE BELOW EXPECTED VALUES, AND PATIENT DOSAGE HAS BEEN CONFIRMED, SUGGEST FOLLOW UP TESTING OF ANTITHROMBIN III LEVELS.   Heparin level (unfractionated)     Status: None   Collection Time: 01/19/16  9:39 PM  Result Value Ref Range   Heparin Unfractionated 0.32 0.30 - 0.70 IU/mL    Comment:        IF HEPARIN RESULTS ARE BELOW EXPECTED VALUES, AND PATIENT DOSAGE HAS BEEN CONFIRMED, SUGGEST FOLLOW UP TESTING OF ANTITHROMBIN III LEVELS.   Heparin level (unfractionated)     Status: None   Collection Time: 01/20/16  2:48 AM  Result Value Ref Range   Heparin Unfractionated 0.34 0.30 - 0.70 IU/mL    Comment:        IF HEPARIN RESULTS ARE BELOW EXPECTED VALUES, AND PATIENT DOSAGE HAS BEEN CONFIRMED, SUGGEST FOLLOW UP TESTING OF ANTITHROMBIN III LEVELS.   CBC     Status: Abnormal   Collection Time: 01/20/16  2:48 AM  Result Value Ref Range   WBC 9.7 4.0 - 10.5  K/uL   RBC  4.04 (L) 4.22 - 5.81 MIL/uL   Hemoglobin 11.6 (L) 13.0 - 17.0 g/dL   HCT 35.1 (L) 39.0 - 52.0 %   MCV 86.9 78.0 - 100.0 fL   MCH 28.7 26.0 - 34.0 pg   MCHC 33.0 30.0 - 36.0 g/dL   RDW 12.6 11.5 - 15.5 %   Platelets 244 150 - 400 K/uL  Basic metabolic panel     Status: Abnormal   Collection Time: 01/20/16  2:48 AM  Result Value Ref Range   Sodium 134 (L) 135 - 145 mmol/L   Potassium 3.9 3.5 - 5.1 mmol/L   Chloride 106 101 - 111 mmol/L   CO2 22 22 - 32 mmol/L   Glucose, Bld 152 (H) 65 - 99 mg/dL   BUN 11 6 - 20 mg/dL   Creatinine, Ser 1.41 (H) 0.61 - 1.24 mg/dL   Calcium 9.1 8.9 - 10.3 mg/dL   GFR calc non Af Amer 58 (L) >60 mL/min   GFR calc Af Amer >60 >60 mL/min    Comment: (NOTE) The eGFR has been calculated using the CKD EPI equation. This calculation has not been validated in all clinical situations. eGFR's persistently <60 mL/min signify possible Chronic Kidney Disease.    Anion gap 6 5 - 15  Glucose, capillary     Status: Abnormal   Collection Time: 01/20/16  8:13 AM  Result Value Ref Range   Glucose-Capillary 170 (H) 65 - 99 mg/dL   Comment 1 Capillary Specimen     Imaging: Imaging results have been reviewed  Tele- NSR  Assessment/Plan:   1. Active Problems: 2.   NSTEMI (non-ST elevated myocardial infarction) ([place]) 3.   Acute MI inferior lateral first episode care ([place]) 4.   Time Spent Directly with Patient:  20 minutes  Length of Stay:  LOS: 2 days   POD #2 Inferior STEMI Rx with aspiration thrombectomy, PCI and DES large RCA. Nl LV by 2D. Had 18 hours of Aggrastat, on IV hep, BB, statin. On DAPT (Brilenta). No CP. Continues on IV hep. I favor relook cath tomorrow to assess resolution of thrombus before D/C IV hep.   Quay Burow 01/20/2016, 8:36 AM

## 2016-01-21 NOTE — Interval H&P Note (Signed)
Cath Lab Visit (complete for each Cath Lab visit)  Clinical Evaluation Leading to the Procedure:   ACS: Yes.    Non-ACS:    Anginal Classification: CCS IV  Anti-ischemic medical therapy: Maximal Therapy (2 or more classes of medications)  Non-Invasive Test Results: No non-invasive testing performed  Prior CABG: No previous CABG      History and Physical Interval Note:  01/21/2016 7:45 AM  Andrew Burgess  has presented today for surgery, with the diagnosis of relook  The various methods of treatment have been discussed with the patient and family. After consideration of risks, benefits and other options for treatment, the patient has consented to  Procedure(s): Left Heart Cath and Coronary Angiography (N/A) as a surgical intervention .  The patient's history has been reviewed, patient examined, no change in status, stable for surgery.  I have reviewed the patient's chart and labs.  Questions were answered to the patient's satisfaction.     Shelva Majestic

## 2016-01-22 ENCOUNTER — Encounter (HOSPITAL_COMMUNITY): Payer: Self-pay | Admitting: Cardiovascular Disease

## 2016-01-22 DIAGNOSIS — E785 Hyperlipidemia, unspecified: Secondary | ICD-10-CM

## 2016-01-22 DIAGNOSIS — I200 Unstable angina: Secondary | ICD-10-CM

## 2016-01-22 DIAGNOSIS — Z955 Presence of coronary angioplasty implant and graft: Secondary | ICD-10-CM

## 2016-01-22 LAB — BASIC METABOLIC PANEL
ANION GAP: 9 (ref 5–15)
BUN: 13 mg/dL (ref 6–20)
CALCIUM: 9 mg/dL (ref 8.9–10.3)
CO2: 20 mmol/L — ABNORMAL LOW (ref 22–32)
Chloride: 109 mmol/L (ref 101–111)
Creatinine, Ser: 1.3 mg/dL — ABNORMAL HIGH (ref 0.61–1.24)
GFR calc Af Amer: >60 mL/min (ref >60)
GLUCOSE: 132 mg/dL — AB (ref 65–99)
Potassium: 4 mmol/L (ref 3.5–5.1)
SODIUM: 138 mmol/L (ref 135–145)

## 2016-01-22 LAB — CBC
HCT: 34.5 % — ABNORMAL LOW (ref 39.0–52.0)
Hemoglobin: 11.3 g/dL — ABNORMAL LOW (ref 13.0–17.0)
MCH: 28.7 pg (ref 26.0–34.0)
MCHC: 32.8 g/dL (ref 30.0–36.0)
MCV: 87.6 fL (ref 78.0–100.0)
PLATELETS: 255 10*3/uL (ref 150–400)
RBC: 3.94 MIL/uL — ABNORMAL LOW (ref 4.22–5.81)
RDW: 12.8 % (ref 11.5–15.5)
WBC: 8.1 10*3/uL (ref 4.0–10.5)

## 2016-01-22 LAB — HEMOGLOBIN A1C
Hgb A1c MFr Bld: 6.7 % — ABNORMAL HIGH (ref 4.8–5.6)
Mean Plasma Glucose: 146 mg/dL

## 2016-01-22 MED ORDER — METOPROLOL TARTRATE 25 MG PO TABS
25.0000 mg | ORAL_TABLET | Freq: Two times a day (BID) | ORAL | Status: DC
  Administered 2016-01-22 – 2016-01-23 (×2): 25 mg via ORAL
  Filled 2016-01-22 (×3): qty 1

## 2016-01-22 NOTE — Progress Notes (Signed)
CARDIAC REHAB PHASE I   PRE:  Rate/Rhythm: 81 SR  BP:  Sitting: 110/67        SaO2: 99 RA  MODE:  Ambulation: 1100 ft   POST:  Rate/Rhythm: 88 SR  BP:  Sitting: 133/93         SaO2: 100 RA  Pt ambulated 1100 ft on RA, independent, steady gait, tolerated well with no complaints. Reviewed yesterday's education including diet (low sodium, heart healthy and carb counting), pt had many questions. Reviewed exercise guidelines. Pt to follow-up with case manager regarding brilinta co-pay prior to discharge. Pt to bed per pt request after walk, call bell within reach. Will follow.   [place], RN, BSN 01/22/2016 2:40 PM

## 2016-01-22 NOTE — Progress Notes (Signed)
Subjective:  No CP/SOB  Objective:  Temp:  [97.8 F (36.6 C)-98.9 F (37.2 C)] 98.9 F (37.2 C) (08/09 0804) Pulse Rate:  [62-288] 78 (08/09 0925) Resp:  [0-28] 15 (08/09 0700) BP: (105-134)/(65-90) 129/78 (08/09 0925) SpO2:  [0 %-100 %] 98 % (08/09 1000) Weight change:   Intake/Output from previous day: 08/08 0701 - 08/09 0700 In: 1786 [I.V.:1786] Out: 1550 [Urine:1550]  Intake/Output from this shift: Total I/O In: 260 [P.O.:240; I.V.:20] Out: -   Physical Exam: General appearance: alert and no distress Neck: no adenopathy, no carotid bruit, no JVD, supple, symmetrical, trachea midline and thyroid not enlarged, symmetric, no tenderness/mass/nodules Lungs: clear to auscultation bilaterally Heart: regular rate and rhythm, S1, S2 normal, no murmur, click, rub or gallop Extremities: extremities normal, atraumatic, no cyanosis or edema  Lab Results: Results for orders placed or performed during the hospital encounter of 01/18/16 (from the past 48 hour(s))  Heparin level (unfractionated)     Status: None   Collection Time: 01/21/16  5:36 AM  Result Value Ref Range   Heparin Unfractionated 0.44 0.30 - 0.70 IU/mL    Comment:        IF HEPARIN RESULTS ARE BELOW EXPECTED VALUES, AND PATIENT DOSAGE HAS BEEN CONFIRMED, SUGGEST FOLLOW UP TESTING OF ANTITHROMBIN III LEVELS.   CBC     Status: Abnormal   Collection Time: 01/21/16  5:36 AM  Result Value Ref Range   WBC 10.6 (H) 4.0 - 10.5 K/uL   RBC 4.07 (L) 4.22 - 5.81 MIL/uL   Hemoglobin 11.9 (L) 13.0 - 17.0 g/dL   HCT 35.3 (L) 39.0 - 52.0 %   MCV 86.7 78.0 - 100.0 fL   MCH 29.2 26.0 - 34.0 pg   MCHC 33.7 30.0 - 36.0 g/dL   RDW 12.7 11.5 - 15.5 %   Platelets 264 150 - 400 K/uL  Hemoglobin A1c     Status: Abnormal   Collection Time: 01/21/16  5:36 AM  Result Value Ref Range   Hgb A1c MFr Bld 6.7 (H) 4.8 - 5.6 %    Comment: (NOTE)         Pre-diabetes: 5.7 - 6.4         Diabetes: >6.4         Glycemic control  for adults with diabetes: <7.0    Mean Plasma Glucose 146 mg/dL    Comment: (NOTE) Performed At: [facility] [place], [place] [id number] Lindon Romp MD [id number]   Lipid panel     Status: Abnormal   Collection Time: 01/21/16  5:36 AM  Result Value Ref Range   Cholesterol 204 (H) 0 - 200 mg/dL   Triglycerides 470 (H) <150 mg/dL   HDL 23 (L) >40 mg/dL   Total CHOL/HDL Ratio 8.9 RATIO   VLDL UNABLE TO CALCULATE IF TRIGLYCERIDE OVER 400 mg/dL 0 - 40 mg/dL   LDL Cholesterol UNABLE TO CALCULATE IF TRIGLYCERIDE OVER 400 mg/dL 0 - 99 mg/dL    Comment:        Total Cholesterol/HDL:CHD Risk Coronary Heart Disease Risk Table                     Men   Women  1/2 Average Risk   3.4   3.3  Average Risk       5.0   4.4  2 X Average Risk   9.6   7.1  3 X Average Risk  23.4   11.0  Use the calculated Patient Ratio above and the CHD Risk Table to determine the patient's CHD Risk.        ATP III CLASSIFICATION (LDL):  <100     mg/dL   Optimal  100-129  mg/dL   Near or Above                    Optimal  130-159  mg/dL   Borderline  160-189  mg/dL   High  >190     mg/dL   Very High   Basic metabolic panel     Status: Abnormal   Collection Time: 01/21/16  5:36 AM  Result Value Ref Range   Sodium 134 (L) 135 - 145 mmol/L   Potassium 4.0 3.5 - 5.1 mmol/L   Chloride 103 101 - 111 mmol/L   CO2 22 22 - 32 mmol/L   Glucose, Bld 171 (H) 65 - 99 mg/dL   BUN 13 6 - 20 mg/dL   Creatinine, Ser 1.37 (H) 0.61 - 1.24 mg/dL   Calcium 9.3 8.9 - 10.3 mg/dL   GFR calc non Af Amer >60 >60 mL/min   GFR calc Af Amer >60 >60 mL/min    Comment: (NOTE) The eGFR has been calculated using the CKD EPI equation. This calculation has not been validated in all clinical situations. eGFR's persistently <60 mL/min signify possible Chronic Kidney Disease.    Anion gap 9 5 - 15  hCG, serum, qualitative     Status: None   Collection Time: 01/21/16  5:36 AM  Result Value Ref  Range   Preg, Serum NEGATIVE NEGATIVE    Comment:        THE SENSITIVITY OF THIS METHODOLOGY IS >10 mIU/mL.   POCT Activated clotting time     Status: None   Collection Time: 01/21/16  9:02 AM  Result Value Ref Range   Activated Clotting Time 505 seconds  CBC     Status: Abnormal   Collection Time: 01/22/16  2:45 AM  Result Value Ref Range   WBC 8.1 4.0 - 10.5 K/uL   RBC 3.94 (L) 4.22 - 5.81 MIL/uL   Hemoglobin 11.3 (L) 13.0 - 17.0 g/dL   HCT 34.5 (L) 39.0 - 52.0 %   MCV 87.6 78.0 - 100.0 fL   MCH 28.7 26.0 - 34.0 pg   MCHC 32.8 30.0 - 36.0 g/dL   RDW 12.8 11.5 - 15.5 %   Platelets 255 150 - 400 K/uL  Basic metabolic panel     Status: Abnormal   Collection Time: 01/22/16  2:45 AM  Result Value Ref Range   Sodium 138 135 - 145 mmol/L   Potassium 4.0 3.5 - 5.1 mmol/L   Chloride 109 101 - 111 mmol/L   CO2 20 (L) 22 - 32 mmol/L   Glucose, Bld 132 (H) 65 - 99 mg/dL   BUN 13 6 - 20 mg/dL   Creatinine, Ser 1.30 (H) 0.61 - 1.24 mg/dL   Calcium 9.0 8.9 - 10.3 mg/dL   GFR calc non Af Amer >60 >60 mL/min   GFR calc Af Amer >60 >60 mL/min    Comment: (NOTE) The eGFR has been calculated using the CKD EPI equation. This calculation has not been validated in all clinical situations. eGFR's persistently <60 mL/min signify possible Chronic Kidney Disease.    Anion gap 9 5 - 15    Imaging: Imaging results have been reviewed  Tele- NSR  Assessment/Plan:   1. Active Problems: 2.  NSTEMI (non-ST elevated myocardial infarction) ([place]) 3.   Acute MI inferior lateral first episode care ([place]) 4.   Status post coronary artery stent placement 5.   Unstable angina ([place]) 6.   Time Spent Directly with Patient:  20 minutes  Length of Stay:  LOS: 4 days   POD # 3 Inf STEMI with large thrombus buren. Had 18 hrs of Aggrastat.  Re look yesterday revealed persistent thrombus in prox aspect of stent as well as a distal lesion both of which were intervened on successfully with placement of  2 additional DES. On DAPT (Brilenta). No CP/SOB. On BB, high dose statin . BP prob soft for ACE_I. Tx tele. [facility]. Prob home AM  Quay Burow 01/22/2016, 10:41 AM

## 2016-01-22 NOTE — Plan of Care (Signed)
Problem: Health Behavior/Discharge Planning: Goal: Ability to manage health-related needs will improve Outcome: Progressing Case Management notified of discharge needs re: New Meds

## 2016-01-23 ENCOUNTER — Encounter (HOSPITAL_COMMUNITY): Payer: Self-pay | Admitting: Physician Assistant

## 2016-01-23 ENCOUNTER — Other Ambulatory Visit: Payer: Self-pay | Admitting: Physician Assistant

## 2016-01-23 ENCOUNTER — Telehealth: Payer: Self-pay | Admitting: Cardiology

## 2016-01-23 DIAGNOSIS — E119 Type 2 diabetes mellitus without complications: Secondary | ICD-10-CM

## 2016-01-23 DIAGNOSIS — E11319 Type 2 diabetes mellitus with unspecified diabetic retinopathy without macular edema: Secondary | ICD-10-CM

## 2016-01-23 DIAGNOSIS — I2510 Atherosclerotic heart disease of native coronary artery without angina pectoris: Secondary | ICD-10-CM | POA: Diagnosis present

## 2016-01-23 DIAGNOSIS — E1169 Type 2 diabetes mellitus with other specified complication: Secondary | ICD-10-CM | POA: Diagnosis present

## 2016-01-23 DIAGNOSIS — E1121 Type 2 diabetes mellitus with diabetic nephropathy: Secondary | ICD-10-CM

## 2016-01-23 DIAGNOSIS — Z9861 Coronary angioplasty status: Secondary | ICD-10-CM

## 2016-01-23 DIAGNOSIS — E785 Hyperlipidemia, unspecified: Secondary | ICD-10-CM | POA: Diagnosis present

## 2016-01-23 LAB — CBC
HEMATOCRIT: 36.2 % — AB (ref 39.0–52.0)
HEMOGLOBIN: 12.1 g/dL — AB (ref 13.0–17.0)
MCH: 29.2 pg (ref 26.0–34.0)
MCHC: 33.4 g/dL (ref 30.0–36.0)
MCV: 87.2 fL (ref 78.0–100.0)
Platelets: 328 10*3/uL (ref 150–400)
RBC: 4.15 MIL/uL — ABNORMAL LOW (ref 4.22–5.81)
RDW: 12.9 % (ref 11.5–15.5)
WBC: 10.5 10*3/uL (ref 4.0–10.5)

## 2016-01-23 MED ORDER — TICAGRELOR 90 MG PO TABS: 90.0000 mg | ORAL_TABLET | Freq: Two times a day (BID) | ORAL | 3 refills | Status: DC

## 2016-01-23 MED ORDER — METFORMIN HCL 500 MG PO TABS: 500.0000 mg | ORAL_TABLET | Freq: Two times a day (BID) | ORAL | 3 refills | Status: DC

## 2016-01-23 MED ORDER — ATORVASTATIN CALCIUM 80 MG PO TABS: 80.0000 mg | ORAL_TABLET | Freq: Every day | ORAL | 3 refills | Status: DC

## 2016-01-23 MED ORDER — METOPROLOL TARTRATE 25 MG PO TABS: 25.0000 mg | ORAL_TABLET | Freq: Two times a day (BID) | ORAL | 6 refills | Status: DC

## 2016-01-23 MED ORDER — NITROGLYCERIN 0.4 MG SL SUBL: 0.4000 mg | SUBLINGUAL_TABLET | SUBLINGUAL | 12 refills | Status: DC | PRN

## 2016-01-23 NOTE — Progress Notes (Signed)
Discharge instructions reviewed and given to patient. Patient has no questions at this time. IV d/c.

## 2016-01-23 NOTE — Progress Notes (Signed)
Subjective:  No CP/SOB, POD # 5 and #1 RCA Inf STEMI  Objective:  Temp:  [98.8 F (37.1 C)-99.8 F (37.7 C)] 99 F (37.2 C) (08/10 0404) Pulse Rate:  [75-99] 75 (08/10 0404) Resp:  [18] 18 (08/10 0404) BP: (110-122)/(67-76) 113/76 (08/10 0404) SpO2:  [97 %-99 %] 98 % (08/10 0404) Weight:  [233 lb 6.4 oz (105.9 kg)] 233 lb 6.4 oz (105.9 kg) (08/10 0404) Weight change:   Intake/Output from previous day: 08/09 0701 - 08/10 0700 In: 860 [P.O.:840; I.V.:20] Out: -   Intake/Output from this shift: No intake/output data recorded.  Physical Exam: General appearance: alert and no distress Neck: no adenopathy, no carotid bruit, no JVD and supple, symmetrical, trachea midline Lungs: clear to auscultation bilaterally Heart: regular rate and rhythm, S1, S2 normal, no murmur, click, rub or gallop Extremities: extremities normal, atraumatic, no cyanosis or edema and Right wrist OK  Lab Results: Results for orders placed or performed during the hospital encounter of 01/18/16 (from the past 48 hour(s))  CBC     Status: Abnormal   Collection Time: 01/22/16  2:45 AM  Result Value Ref Range   WBC 8.1 4.0 - 10.5 K/uL   RBC 3.94 (L) 4.22 - 5.81 MIL/uL   Hemoglobin 11.3 (L) 13.0 - 17.0 g/dL   HCT 34.5 (L) 39.0 - 52.0 %   MCV 87.6 78.0 - 100.0 fL   MCH 28.7 26.0 - 34.0 pg   MCHC 32.8 30.0 - 36.0 g/dL   RDW 12.8 11.5 - 15.5 %   Platelets 255 150 - 400 K/uL  Basic metabolic panel     Status: Abnormal   Collection Time: 01/22/16  2:45 AM  Result Value Ref Range   Sodium 138 135 - 145 mmol/L   Potassium 4.0 3.5 - 5.1 mmol/L   Chloride 109 101 - 111 mmol/L   CO2 20 (L) 22 - 32 mmol/L   Glucose, Bld 132 (H) 65 - 99 mg/dL   BUN 13 6 - 20 mg/dL   Creatinine, Ser 1.30 (H) 0.61 - 1.24 mg/dL   Calcium 9.0 8.9 - 10.3 mg/dL   GFR calc non Af Amer >60 >60 mL/min   GFR calc Af Amer >60 >60 mL/min    Comment: (NOTE) The eGFR has been calculated using the CKD EPI equation. This calculation  has not been validated in all clinical situations. eGFR's persistently <60 mL/min signify possible Chronic Kidney Disease.    Anion gap 9 5 - 15  CBC     Status: Abnormal   Collection Time: 01/23/16  3:30 AM  Result Value Ref Range   WBC 10.5 4.0 - 10.5 K/uL   RBC 4.15 (L) 4.22 - 5.81 MIL/uL   Hemoglobin 12.1 (L) 13.0 - 17.0 g/dL   HCT 36.2 (L) 39.0 - 52.0 %   MCV 87.2 78.0 - 100.0 fL   MCH 29.2 26.0 - 34.0 pg   MCHC 33.4 30.0 - 36.0 g/dL   RDW 12.9 11.5 - 15.5 %   Platelets 328 150 - 400 K/uL    Imaging: Imaging results have been reviewed   Tele- NSR Assessment/Plan:   1. Active Problems: 2.   NSTEMI (non-ST elevated myocardial infarction) ([place]) 3.   Acute MI inferior lateral first episode care ([place]) 4.   Status post coronary artery stent placement 5.   Unstable angina ([place]) 6.   Time Spent Directly with Patient:  20 minutes  Length of Stay:  LOS: 5 days  POD # 5 Inf STEMI Rx with RCA DES. Large thrombus burden  Rx asp thrombectomy and post procedure Aggrastat. Relook cath yesterday revealed residual thrombus proximal aspect of stent with distal RCA lesion, both of which were stented. Looks good today. No CP. Exam benign. OK for DC home on DAPT Luna Kitchens; will get Case manager to see re medication assistance ). ROV with DR Irish Lack.  Quay Burow 01/23/2016, 9:49 AM

## 2016-01-23 NOTE — Progress Notes (Signed)
CARDIAC REHAB PHASE I   PRE:  Rate/Rhythm: 72 SR  BP:  Sitting: 145/92        SaO2: 100 RA  MODE:  Ambulation: 1100 ft   POST:  Rate/Rhythm: 91 SR  BP:  Sitting: 149/91         SaO2: 100 RA  Pt ambulated 110 ft on RA, independent, steady gait, tolerated well with no complaints. Pt had questions regarding his medication list, choice of pharmacy. RN to review discharge instructions/medication list with pt prior to discharge. Pt verbalized understanding. Pt states he has no other questions at this time. Pt to edge of bed after walk per pt request, call bell within reach.   [id number] Lenna Sciara, RN, BSN 01/23/2016 10:12 AM

## 2016-01-23 NOTE — Progress Notes (Signed)
Inpatient Diabetes Program Recommendations  AACE/ADA: New Consensus Statement on Inpatient Glycemic Control (2015)  Target Ranges:  Prepandial:   less than 140 mg/dL      Peak postprandial:   less than 180 mg/dL (1-2 hours)      Critically ill patients:  140 - 180 mg/dL   Results for Umbaugh, Thanos L (MRN [id number]) as of 01/23/2016 11:50  Ref. Range 01/21/2016 05:36 01/22/2016 02:45  Hemoglobin A1C Latest Ref Range: 4.8 - 5.6 % 6.7 (H)   Glucose Latest Ref Range: 65 - 99 mg/dL 171 (H) 132 (H)   Review of Glycemic Control  Diabetes history: No Outpatient Diabetes medications: NA Current orders for Inpatient glycemic control: None  Inpatient Diabetes Program Recommendations: HgbA1C: A1C 6.7% on 01/21/16. Per ADA criteria, if A1C 6.5% or greater criteria for DM dx would be met. Did not note documented history of diabetes in chart but did not history of hyperglycemia. MD, please note if patient will be dx with DM at this time. If so, please inform patient and bedside RNs so patient can be educated. Recommend patient follow up with PCP regarding glycemic control.  Thanks, Marie Byrd, RN, MSN, CDE Diabetes Coordinator Inpatient Diabetes Program [phone] (Team Pager from 8am to 5pm) [phone] ([facility] office) [phone] ([facility] office) [phone] ([facility] office)   

## 2016-01-23 NOTE — Telephone Encounter (Signed)
New message    TOC appt on  8.18.2017 with Almyra Deforest  @ [facility] per Angelena Form

## 2016-01-23 NOTE — Plan of Care (Signed)
Problem: Cardiac: Goal: Ability to achieve and maintain adequate cardiopulmonary perfusion will improve Outcome: Progressing Patient has had a couple of stents placed to the RCA via the right radial access with no s/s of complications and VSS with patient in a NSR. Patient ambulated around the nurse's station with family without any difficulty and no c/o pain or discomfort was reported, will continue to monitor.

## 2016-01-23 NOTE — Progress Notes (Signed)
Report received in patient's room via Marya Amsler RN using SBAR format, reviewed orders, VS, meds and patient's general condition, assumed care of patient.

## 2016-01-23 NOTE — Care Management (Addendum)
CM did speak with pt in regards to Medications. Pt uses [facility] on [address]. Pt has 30 day free and co pay card. CM did go over both cards. Pt will need a Rx for 30 day no refills and then a separate Rx for 3 month supply to get the cost to be $18.00. Medication is in stock. Benefits Check for Brilinta:   OTPUM RX # [phone]   BRILINTA 90 MG BID (30 )  COVER- YES  CO-PAY- $50.00  60 TAB  TIER- 3 DRUG  PRIOR APPROVAL - NO  [facility] AND [facility]

## 2016-01-23 NOTE — Plan of Care (Signed)
Problem: Education: Goal: Understanding of cardiac disease, CV risk reduction, and recovery process will improve Outcome: Progressing Patient given written information regarding the care of his radial incision site, s/s of complications and when to call MD or EMS and patient was able to verbalize understanding. Reviewed diet and exercise and how to help keep heart clean and healthy and patient is committed to trying to learn new foods and exercise more to keep his heart healthy and was given information on Brilinta and reviewed what it was, why he will be using it, dose and times to take and when to call MD and all questions answered and patient verbalized understanding. Patient was able to state symptoms to pay attention to that might be a sign that his heart is having trouble and what to do. Patient was able to verbalize some life changes and s/s of when to call MD, will continue to monitor.

## 2016-01-23 NOTE — Discharge Instructions (Signed)

## 2016-01-23 NOTE — Discharge Summary (Signed)
Discharge Summary    Patient ID: Andrew Burgess,  MRN: [id number], DOB/AGE: [birth year] 45 y.o.  Admit date: 01/18/2016 Discharge date: 01/23/2016  Primary Care Provider: [facility] Primary Cardiologist: Dr. Stanford Breed   Discharge Diagnoses    Principal Problem:   Acute MI inferior lateral first episode care [facility]) Active Problems:   Status post coronary artery stent placement   Hypertriglyceridemia   Obesity   CAD (coronary artery disease)   Hyperlipidemia   Diabetes mellitus ([place])   Allergies No Known Allergies   History of Present Illness     45 yo male with PMH of HLD with hypertriglyceridemia who presented to [facility] ED on 01/18/16 for evaluation of chest pain and dyspnea.   He has no prior cardiac history. He presented with chest pain pressure and dyspnea 1 week as well as hemoptysis. POC troponin was 1.10. There was initially concern for pulmonary embolism however d-dimer was negative and bedside echo showed normal LV systolic function and normal RV systolic function, pulmonary embolism was felt to be less likely. The patient then developed subsequent chest pain and EKG showed inferior lateral T-wave inversions and subtle inferior ST elevation and he was brought back for emergent cardiac catheterization.   Hospital Course     Consultants: none  Inferior STEMI: Peak troponin 23.15. Left heart cath on 01/21/16 showed 100% occluded mid-dist RCA occlusion with heavy thrombus burden s/p DES. Rx asp thrombectomy and post procedure Aggrastat. Re look 01/21/16 revealed persistent thrombus in prox aspect of stent as well as a distal lesion both of which were intervened on successfully with placement of 2 additional DES. -- 2D ECHO with normal LV function. -- On DAPT (ASA/Brilinta), metoprolol 25mg  BID and atorva 80mg  daily. No room in BP for ACE-I, this can be initiated as an outpatient if BP will tolerate. Patient very concerned about cost of medications. He will get 30 day free  supply of Brilinta and 90 day supply should be 18$ the first time filled and then 50$ thereafter. If he cannot afford this long term, we can switch him to plavix after 4 months.   New onset DMT2: will start Metformin 500mg  BID tomorrow am (01/24/16) which is over 48 hours after his last heart cath. He will arrange follow up with his PCP as soon as possible to manage his diabetes.  HLD with hypertriglyceridemia: he was previously on fenofibrate but this was discontinued on admission. He has TG >1000 just 11 months ago, so will resume fenofibrate along with statin and Lovaza.   Hemoptysis: this resolved and hemoglobin remained stable.   CKD: creat around baseline of 1.3  The patient has had an uncomplicated hospital course and is recovering well. The radial catheter site is stable. He has been seen by Dr. Gwenlyn Found today and deemed ready for discharge home. All follow-up appointments have been scheduled.  A written RX for a 30 day free supply of Brilinta was provided for the patient. A work excuse note was provided as well. Discharge medications are listed below.  _____________  Discharge Vitals Blood pressure 116/76, pulse 75, temperature 99 F (37.2 C), temperature source Oral, resp. rate 18, height 6' (1.829 m), weight 233 lb 6.4 oz (105.9 kg), SpO2 98 %.  Filed Weights   01/18/16 0805 01/21/16 0500 01/23/16 0404  Weight: 240 lb (108.9 kg) 239 lb 4.8 oz (108.5 kg) 233 lb 6.4 oz (105.9 kg)    Labs & Radiologic Studies     CBC  Recent Labs  01/22/16 0245 01/23/16 0330  WBC 8.1 10.5  HGB 11.3* 12.1*  HCT 34.5* 36.2*  MCV 87.6 87.2  PLT 255 [id number]   Basic Metabolic Panel  Recent Labs  01/21/16 0536 01/22/16 0245  NA 134* 138  K 4.0 4.0  CL 103 109  CO2 22 20*  GLUCOSE 171* 132*  BUN 13 13  CREATININE 1.37* 1.30*  CALCIUM 9.3 9.0   Hemoglobin A1C  Recent Labs  01/21/16 0536  HGBA1C 6.7*   Fasting Lipid Panel  Recent Labs  01/21/16 0536  CHOL 204*  HDL 23*    LDLCALC UNABLE TO CALCULATE IF TRIGLYCERIDE OVER 400 mg/dL  TRIG 470*  CHOLHDL 8.9   Thyroid Function Tests No results for input(s): TSH, T4TOTAL, T3FREE, THYROIDAB in the last 72 hours.  Invalid input(s): FREET3  Dg Chest 2 View  Result Date: 01/18/2016 CLINICAL DATA:  45 year old male with a history of intermittent shortness of breath and fever. Left-sided chest pain. EXAM: CHEST  2 VIEW COMPARISON:  None. FINDINGS: The heart size and mediastinal contours are within normal limits. Both lungs are clear. The visualized skeletal structures are unremarkable. IMPRESSION: No radiographic evidence of acute cardiopulmonary disease. Signed, Dulcy Fanny. Earleen Newport, DO [facility] Radiology Electronically Signed   By: Corrie Mckusick D.O.   On: 01/18/2016 09:19     Diagnostic Studies/Procedures    LHC 01/18/16 Conclusion    Mid RCA to Dist RCA lesion, 100 %stenosed with heavy thrombus burden. A STENT SYNERGY DES 3.5X38 drug eluting stent was successfully placed.  LV end diastolic pressure is moderately elevated.  There is no aortic valve stenosis.  Mid RCA to Dist RCA lesion, 100 %stenosed.  Post intervention, there is a 10% residual stenosis.   Continue IV tirofiban for 18 hours. Restart heparin in 8 hours. I would continue the heparin until Monday. At that point, would have to consider a relook catheterization to see if some of the thrombus has resolved, depending on how he is doing.  Start high-dose statin and low-dose beta blocker. IV nitroglycerin for pressure. He'll need dual antiplatelet therapy for at least a year.     LHC 01/21/16 Conclusion  Normal left coronary circulation involving the LAD, ramus intermediate, and left circumflex vessel.  Large dominant RCA with a superior takeoff and with the previously placed 3.538 mm Synergy DES stent extending from the mid RCA to just proximal to the distal RCA bifurcation with significant  residual thrombus in the proximal third of this stented segment and 90% stenosis at a site of previous thrombus at the bifurcation of the PDA and continuation branch.  Difficult but successful intervention with ultimate insertion of a new 3.012 mm Synergy DES stent at the distal RCA 90% bifurcation with the 90% stenosis being reduced to 0% no change in the 80% ostial PDA stenosis following stenting, and PTCA with insertion of a new 3.520 mm Synergy DES stent extending from the previously placed mid RCA proximally with complete resolution of prior significant residual thrombus burden.   Recommendation: The patient will continue with bivalirudin for 4 hours post procedure.  He will continue with DAPT for minimum of a year.    _____________  2D ECHO: 01/18/2016 LV EF: 55% -   60% Study Conclusions - Left ventricle: The cavity size was normal. Wall thickness was   normal. Systolic function was normal. The estimated ejection   fraction was in the range of 55% to 60%. There is akinesis of the  basalinferoseptal myocardium. Left ventricular diastolic function   parameters were normal. - Aortic valve: Trileaflet; mildly thickened, moderately calcified   leaflets. - Tricuspid valve: There was trivial regurgitation.  Disposition   Pt is being discharged home today in good condition.  Follow-up Plans & Appointments    Follow-up Information    [place], [place]. Go on 01/31/2016.   Specialties:  Cardiology, Radiology Why:  At 2 PM (please show up to your appointment 15 minutes early) Contact information: [address] [place] [address] [phone]          Discharge Instructions    Amb Referral to Cardiac Rehabilitation    Complete by:  As directed   Diagnosis:   Coronary Stents Comment - also documented as acute inferior lateral MI NSTEMI        Discharge Medications     Medication List    STOP taking these medications   ALEVE 220 MG tablet Generic  drug:  naproxen sodium     TAKE these medications   ASPIRIN PO Take 81 mg by mouth daily.   atorvastatin 80 MG tablet Commonly known as:  LIPITOR Take 1 tablet (80 mg total) by mouth daily at 6 PM.   fenofibrate 160 MG tablet Take 1 tablet (160 mg total) by mouth daily.   metFORMIN 500 MG tablet Commonly known as:  GLUCOPHAGE Take 1 tablet (500 mg total) by mouth 2 (two) times daily with a meal. Start taking on:  01/24/2016   metoprolol tartrate 25 MG tablet Commonly known as:  LOPRESSOR Take 1 tablet (25 mg total) by mouth 2 (two) times daily.   NF FORMULAS TESTOSTERONE PO Take 1 capsule by mouth 3 (three) times daily. "Nugenix Testosterone Booster"   nitroGLYCERIN 0.4 MG SL tablet Commonly known as:  NITROSTAT Place 1 tablet (0.4 mg total) under the tongue every 5 (five) minutes as needed for chest pain.   omega-3 acid ethyl esters 1 g capsule Commonly known as:  LOVAZA Take 1 capsule (1 g total) by mouth 2 (two) times daily.   ticagrelor 90 MG Tabs tablet Commonly known as:  BRILINTA Take 1 tablet (90 mg total) by mouth 2 (two) times daily.       Aspirin prescribed at discharge? Yes High Intensity Statin Prescribed? (Lipitor 40-80mg  or Crestor 20-40mg ): Yes Beta Blocker Prescribed? Yes For EF 45% or less, Was ACEI/ARB Prescribed? No: EF normal ADP Receptor Inhibitor Prescribed? (i.e. Plavix etc.-Includes Medically Managed Patients): Yes For EF <45%, Aldosterone Inhibitor Prescribed? No: EF normal Was EF assessed during THIS hospitalization? YES Was Cardiac Rehab II ordered? (Included Medically managed Patients): Yes   Outstanding Labs/Studies   none  Duration of Discharge Encounter   Greater than 30 minutes including physician time.  Signed, Angelena Form PA-C 01/23/2016, 12:29 PM

## 2016-01-24 NOTE — Telephone Encounter (Signed)
Attempt call #1-no answer, LMTCB.

## 2016-01-27 NOTE — Telephone Encounter (Signed)
Per TOC protocol, 2 attempts have been made to reach patient on separate days. Encounter closed.

## 2016-01-27 NOTE — Telephone Encounter (Signed)
attemp call #2 - goes to VM - [facility]

## 2016-01-31 ENCOUNTER — Ambulatory Visit: Payer: 59 | Admitting: Physician Assistant

## 2016-01-31 ENCOUNTER — Encounter: Payer: Self-pay | Admitting: Physician Assistant

## 2016-01-31 ENCOUNTER — Ambulatory Visit (INDEPENDENT_AMBULATORY_CARE_PROVIDER_SITE_OTHER): Payer: 59 | Admitting: Physician Assistant

## 2016-01-31 VITALS — BP 100/90 | HR 57 | Ht 72.0 in | Wt 232.0 lb

## 2016-01-31 DIAGNOSIS — I2510 Atherosclerotic heart disease of native coronary artery without angina pectoris: Secondary | ICD-10-CM | POA: Diagnosis not present

## 2016-01-31 DIAGNOSIS — E781 Pure hyperglyceridemia: Secondary | ICD-10-CM | POA: Diagnosis not present

## 2016-01-31 DIAGNOSIS — D573 Sickle-cell trait: Secondary | ICD-10-CM

## 2016-01-31 DIAGNOSIS — E119 Type 2 diabetes mellitus without complications: Secondary | ICD-10-CM | POA: Diagnosis not present

## 2016-01-31 DIAGNOSIS — N182 Chronic kidney disease, stage 2 (mild): Secondary | ICD-10-CM

## 2016-01-31 DIAGNOSIS — E1169 Type 2 diabetes mellitus with other specified complication: Secondary | ICD-10-CM

## 2016-01-31 DIAGNOSIS — E669 Obesity, unspecified: Secondary | ICD-10-CM

## 2016-01-31 LAB — BASIC METABOLIC PANEL WITH GFR
BUN: 18 mg/dL (ref 7–25)
CALCIUM: 9.7 mg/dL (ref 8.6–10.3)
CHLORIDE: 106 mmol/L (ref 98–110)
CO2: 22 mmol/L (ref 20–31)
CREATININE: 1.53 mg/dL — AB (ref 0.60–1.35)
GFR, Est African American: 62 mL/min (ref >=60)
GFR, Est Non African American: 53 mL/min — ABNORMAL LOW (ref >=60)
Glucose, Bld: 86 mg/dL (ref 65–99)
Potassium: 4.4 mmol/L (ref 3.5–5.3)
SODIUM: 137 mmol/L (ref 135–146)

## 2016-01-31 NOTE — Patient Instructions (Signed)
Labwork  Your physician recommends that you return for lab work: BMET  Return for more labwork in 2 months: LIPID and Hepatic function testing   Follow-Up  Your physician recommends that you schedule a follow-up appointment in: 3 months with Dr.Crenshaw.  If you need a refill on your cardiac medications before your next appointment, please call your pharmacy.

## 2016-01-31 NOTE — Progress Notes (Signed)
Cardiology Office Note    Date:  01/31/2016   ID:  Andrew, Burgess [birth year], MRN [id number]  PCP:  Loura Pardon, MD  Cardiologist:  Dr. Stanford Breed  Chief Complaint  Patient presents with  . Hospitalization Follow-up    seen for Dr. Stanford Breed    History of Present Illness:  Andrew Burgess is a 45 y.o. male with PMH of HLD followed by lipid clinic, h/o sickle cell trait, stage II CKD and recently diagnosed CAD. He was sent by his PCP on 01/18/2016 to the emergency room for substernal chest pain and increased dyspnea on exertion for one week. Troponin was elevated on arrival. Initially there was concern of pulmonary embolism, however d-dimer came back normal. Given elevated troponin, he underwent cardiac catheterization on 01/18/2016 which showed 100% mid to distal RCA occlusion with heavy thrombus burden treated with 3.5 x 38 mm Synergy DES. Postprocedure, IV tirofiban was continued for 18 hours. Due to the heavy thrombus burden, a relook cardiac catheterization was planned immediately after the initial intervention. Echocardiogram obtained on 01/18/2016 showed normal EF 55-60%, akinesis of basal inferoseptal myocardium. He underwent planned relook intervention 01/21/2016, which showed 90% distal RCA stenosis treated with a 3.0 x 12 mm Synergy DES with additional 3.5 x 20 mm Synergy DES extending proximally from previously placed mid RCA stent with complete resolution of prior significant residual thrombus burden. He was kept on bivalirudin for 4 hours post procedure. During this admission, he had a newly diagnosed diabetes mellitus with hemoglobin A1C 6.7. Lipid profile obtained on 01/21/2016 showed cholesterol 204, triglyceride 470, HDL 23, unable to calculate LDL. He was discharged on aspirin, high-dose Lipitor, fenofibrate, metoprolol, Brilinta and metformin.  He presents today for cardiology follow-up. He denies any chest pain or SOB. He has been trying to climb some stairs without any exertional  symptom. He has not started on cardiac rehab yet. He was surprised to learn about his triglyceride level and low HDL. Otherwise he has been compliant with DAPT, fenofibrate, statin and omega 3. I think he can start cardiac rehab which will be beneficial for him. We have discussed the importance of compliance with DAPT and controlling risk factors. We will recheck FLP and LFT in 2 month. Although his EKG is read as STEMI, he is completely asymptomatic. I have reviewed his EKG with Dr. Gwenlyn Found who agree there is no significant change. He will follow up with Dr. Stanford Breed in 3 month.  I did not bill for transition of care visit as we have failed to contact the patient after discharge despite 2 tries.    Past Medical History:  Diagnosis Date  . Allergy   . CAD (coronary artery disease)    a. 01/2016: Peak troponin 23.15. Left heart cath on 01/21/16 showed 100% occluded mid-dist RCA occlusion with heavy thrombus burden s/p DES. Rx asp thrombectomy and post procedure Aggrastat. Re look 01/21/16 revealed persistent thrombus in prox aspect of stent as well as a distal lesion both of which were intervened on successfully with placement of 2 additional DES.  . Diabetes mellitus ([place])   . Hyperglycemia   . Hyperlipidemia    triglyceriedes   . Hypertriglyceridemia   . Obesity   . Sickle cell trait [facility])     Past Surgical History:  Procedure Laterality Date  . ANKLE SURGERY    . CARDIAC CATHETERIZATION N/A 01/18/2016   Procedure: Left Heart Cath and Coronary Angiography;  Surgeon: Jettie Booze, MD;  Location: [place] [facility];  Service: Cardiovascular;  Laterality: N/A;  . CARDIAC CATHETERIZATION N/A 01/18/2016   Procedure: Coronary Stent Intervention;  Surgeon: Jettie Booze, MD;  Location: [place] [facility];  Service: Cardiovascular;  Laterality: N/A;  MID RCA  . CARDIAC CATHETERIZATION N/A 01/21/2016   Procedure: Left Heart Cath and Coronary Angiography;  Surgeon: Troy Sine, MD;  Location:  [place] [facility];  Service: Cardiovascular;  Laterality: N/A;  . CARDIAC CATHETERIZATION N/A 01/21/2016   Procedure: Coronary Stent Intervention;  Surgeon: Troy Sine, MD;  Location: [place] [facility];  Service: Cardiovascular;  Laterality: N/A;    Current Medications: Outpatient Medications Prior to Visit  Medication Sig Dispense Refill  . ASPIRIN PO Take 81 mg by mouth daily.     [name] atorvastatin (LIPITOR) 80 MG tablet Take 1 tablet (80 mg total) by mouth daily at 6 PM. 90 tablet 3  . fenofibrate 160 MG tablet Take 1 tablet (160 mg total) by mouth daily. 30 tablet 11  . metFORMIN (GLUCOPHAGE) 500 MG tablet Take 1 tablet (500 mg total) by mouth 2 (two) times daily with a meal. 60 tablet 3  . metoprolol tartrate (LOPRESSOR) 25 MG tablet Take 1 tablet (25 mg total) by mouth 2 (two) times daily. 60 tablet 6  . omega-3 acid ethyl esters (LOVAZA) 1 G capsule Take 1 capsule (1 g total) by mouth 2 (two) times daily. 60 capsule 11  . ticagrelor (BRILINTA) 90 MG TABS tablet Take 1 tablet (90 mg total) by mouth 2 (two) times daily. 180 tablet 3  . Misc Natural Products (NF FORMULAS TESTOSTERONE PO) Take 1 capsule by mouth 3 (three) times daily. "Nugenix Testosterone Booster"    . nitroGLYCERIN (NITROSTAT) 0.4 MG SL tablet Place 1 tablet (0.4 mg total) under the tongue every 5 (five) minutes as needed for chest pain. 25 tablet 12   No facility-administered medications prior to visit.      Allergies:   Review of patient's allergies indicates no known allergies.   Social History   Social History  . Marital status: Married    Spouse name: N/A  . Number of children: 2  . Years of education: N/A   Occupational History  . Truck driver    Social History Main Topics  . Smoking status: Former Smoker    Types: Cigars    Quit date: 01/18/2012  . Smokeless tobacco: Never Used  . Alcohol use 0.0 oz/week     Comment: Occasionally   . Drug use: No  . Sexual activity: Yes   Other Topics Concern    . None   Social History Narrative   Some caffeine      Family History:  The patient's family history includes Alcohol abuse in his father; Cancer in his maternal grandmother; Coronary artery disease in his mother; Heart attack in his mother; Hyperlipidemia in his father and mother; Hypertension in his brother and father.   ROS:   Please see the history of present illness.    ROS All other systems reviewed and are negative.   PHYSICAL EXAM:   VS:  BP 100/90   Pulse (!) 57   Ht 6' (1.829 m)   Wt 232 lb (105.2 kg)   BMI 31.46 kg/m    GEN: Well nourished, well developed, in no acute distress  HEENT: normal  Neck: no JVD, carotid bruits, or masses Cardiac: RRR; no murmurs, rubs, or gallops,no edema  Respiratory:  clear to auscultation bilaterally, normal work of breathing GI:  soft, nontender, nondistended, + BS MS: no deformity or atrophy  Skin: warm and dry, no rash Neuro:  Alert and Oriented x 3, Strength and sensation are intact Psych: euthymic mood, full affect  Wt Readings from Last 3 Encounters:  01/31/16 232 lb (105.2 kg)  01/23/16 233 lb 6.4 oz (105.9 kg)  02/25/15 246 lb (111.6 kg)      Studies/Labs Reviewed:   EKG:  EKG is ordered today.  The ekg ordered today demonstrates NSR with TWI in inferior leads, TWI in V3-V6. TWI in lateral leads is new when compare to EKG on 8/9, however unchanged when compared with EKG on 8/8  Recent Labs: 02/25/2015: ALT 36 01/22/2016: BUN 13; Creatinine, Ser 1.30; Potassium 4.0; Sodium 138 01/23/2016: Hemoglobin 12.1; Platelets 328   Lipid Panel    Component Value Date/Time   CHOL 204 (H) 01/21/2016 0536   TRIG 470 (H) 01/21/2016 0536   HDL 23 (L) 01/21/2016 0536   CHOLHDL 8.9 01/21/2016 0536   VLDL UNABLE TO CALCULATE IF TRIGLYCERIDE OVER 400 mg/dL 01/21/2016 0536   LDLCALC UNABLE TO CALCULATE IF TRIGLYCERIDE OVER 400 mg/dL 01/21/2016 0536   LDLDIRECT 57.0 02/25/2015 0939    Additional studies/ records that were reviewed  today include:   Cath 01/18/2016 Conclusion     Mid RCA to Dist RCA lesion, 100 %stenosed with heavy thrombus burden. A STENT SYNERGY DES 3.5X38 drug eluting stent was successfully placed.  LV end diastolic pressure is moderately elevated.  There is no aortic valve stenosis.  Mid RCA to Dist RCA lesion, 100 %stenosed.  Post intervention, there is a 10% residual stenosis.   Continue IV tirofiban for 18 hours. Restart heparin in 8 hours. I would continue the heparin until Monday. At that point, would have to consider a relook catheterization to see if some of the thrombus has resolved, depending on how he is doing.  Start high-dose statin and low-dose beta blocker. IV nitroglycerin for pressure. He'll need dual antiplatelet therapy for at least a year.    Echo 01/18/2016 LV EF: 55% -   60%  ------------------------------------------------------------------- Indications:      Chest pain 786.51.  ------------------------------------------------------------------- History:   PMH:  No prior cardiac history.  ------------------------------------------------------------------- Study Conclusions  - Left ventricle: The cavity size was normal. Wall thickness was   normal. Systolic function was normal. The estimated ejection   fraction was in the range of 55% to 60%. There is akinesis of the   basalinferoseptal myocardium. Left ventricular diastolic function   parameters were normal. - Aortic valve: Trileaflet; mildly thickened, moderately calcified   leaflets. - Tricuspid valve: There was trivial regurgitation   Cath 01/21/2016 Normal left coronary circulation involving the LAD, ramus intermediate, and left circumflex vessel.  Large dominant RCA with a superior takeoff and with the previously placed 3.538 mm Synergy DES stent extending from the mid RCA to just proximal to the distal RCA bifurcation with significant residual thrombus in the proximal third of this stented segment  and 90% stenosis at a site of previous thrombus at the bifurcation of the PDA and continuation branch.  Difficult but successful intervention with ultimate insertion of a new 3.012 mm Synergy DES stent at the distal RCA 90% bifurcation with the 90% stenosis being reduced to 0% no change in the 80% ostial PDA stenosis following stenting, and PTCA with insertion of a new 3.520 mm Synergy DES stent extending from the previously placed mid RCA proximally with complete resolution of prior significant residual thrombus burden.  Recommendation: The patient will continue with bivalirudin for 4 hours post procedure.  He will continue with DAPT for minimum of a year.   ASSESSMENT:    1. Coronary artery disease involving native coronary artery of native heart without angina pectoris   2. Hypertriglyceridemia   3. Diabetes mellitus type 2 in obese ([place])   4. Sickle cell trait ([place])   5. Chronic kidney disease (CKD), stage II (mild)      PLAN:  In order of problems listed above:  1. CAD s/p DES x 3 to mid RCA: compliant with DAPT, will refer patient to cardiac rehab. No obvious angina. EKG was electronically interpreted as STEMI, I have reviewed with Dr. Gwenlyn Found, who felt there is no change. We have discussed the need to aggressively control risk factors. He understand the importance of diet and exercise.  2. Severe hypertriglyceridemia: continue fenofibrate, omega 3 and high dose lipitor. Repeat LFT and FLP in 2 month, if still high, plan to refer the patient to lipid clinic  3. DM II: newly diagnosed during recent hospitalization. Started on Metformin, further management by PCP.  4. CKD stage II: given recent cath and initiation of metformin, will check BMET today    Medication Adjustments/Labs and Tests Ordered: Current medicines are reviewed at length with the patient today.  Concerns regarding medicines are outlined above.  Medication changes, Labs and Tests ordered today are listed in the  Patient Instructions below. Patient Instructions  Labwork  Your physician recommends that you return for lab work: BMET  Return for more labwork in 2 months: LIPID and Hepatic function testing   Follow-Up  Your physician recommends that you schedule a follow-up appointment in: 3 months with Dr.Crenshaw.  If you need a refill on your cardiac medications before your next appointment, please call your pharmacy.       Hilbert Corrigan, [place]  01/31/2016 3:37 PM    [facility] [place], [place], [place]  [address] Phone: [phone]; Fax: [phone]

## 2016-02-03 ENCOUNTER — Telehealth: Payer: Self-pay | Admitting: Cardiology

## 2016-02-03 ENCOUNTER — Ambulatory Visit: Payer: 59 | Admitting: Physician Assistant

## 2016-02-03 ENCOUNTER — Other Ambulatory Visit: Payer: Self-pay | Admitting: *Deleted

## 2016-02-03 DIAGNOSIS — R748 Abnormal levels of other serum enzymes: Secondary | ICD-10-CM

## 2016-02-03 NOTE — Telephone Encounter (Signed)
8.18.17 Patient brought FMLA forms to office visit to be completed and signed.  FMLA forms and AUTH received by Franky Macho on 01/31/16.  Patient would bring payment to office on 02/03/16 for processing by [employer].  8.21.17 Received Money Order to [employer] from patient to process FMLA forms.  Sent FMLA Forms/AUTH and Money order to [employer] on 02/03/16 via [place]. lp

## 2016-02-06 ENCOUNTER — Telehealth: Payer: Self-pay

## 2016-02-06 NOTE — Telephone Encounter (Signed)
Pt left v/m; pt is going to be out of work for couple of months and pt concerned about paying for medications; pt wants to know what pt could do in regard to taking fenofibrate and omega 3 (Lovaza). Kalman Shan is out of office today who has some info about savings programs for meds. I looked at her list and [facility] has 30 day supply of fenofibrate for $15.00. [facility] and 30 day supply is $30.00.Please advise.

## 2016-02-06 NOTE — Telephone Encounter (Signed)
Please send [facility] day supply to [facility] with refills for the year if he wants them- that is a pretty good price  Also do please have Rose continue looking -thanks

## 2016-02-07 NOTE — Telephone Encounter (Signed)
I am fine with him taking otc fish oil at 1000-3000 mg daily as tolerated until he can afford the Lovaza again  Would you please call him about the cheer and discount programs? That would be so helpful, thanks!  Thanks for all you do !

## 2016-02-07 NOTE — Telephone Encounter (Signed)
I can get him one month completely free to him thru our Spread the Cheer program.  I also can email a discount coupon for it if he has an email address.  It can discount up to [id number] at certain pharmacies, such as [facility], [facility], [facility], etc. so he would have to contact them for the exact amount.   As for the Lovaza, I can't find anything reasonable for it.  Can he take an OTC version?  If you have further questions please let me know.  Also, let me know when I need to get the paperwork ready for [place].  Thank you!

## 2016-02-07 NOTE — Telephone Encounter (Signed)
See Rose's prev note

## 2016-02-07 NOTE — Telephone Encounter (Signed)
Pt left /vm requesting cb about status of less expensive med.

## 2016-02-07 NOTE — Telephone Encounter (Signed)
Routed to [facility]

## 2016-02-10 NOTE — Telephone Encounter (Signed)
I notified patient and advised him of what we can do to help him out w/the fenofibrate and I read what Dr. Glori Bickers said about OTC fish oil.  He has enough of both meds for the month, but I advised him to contact us when he needs a refill so I can complete the necessary paper work for [employer] and so the prescription can be filled.  I also advised him of the pricing at [facility] and about the coupon for up to 75% off.  I gave him my name and direct number to contact me when he is ready for a re-fill.  I hope that this helps!  Thank you!

## 2016-02-10 NOTE — Telephone Encounter (Signed)
Thanks

## 2016-02-19 ENCOUNTER — Telehealth: Payer: Self-pay | Admitting: Physician Assistant

## 2016-02-19 NOTE — Telephone Encounter (Signed)
Both FMLA Form and [employer] Short [employer] Disability Forms received in office after being sent to [facility] In error.  Forms given to Almyra Deforest, PA to review, complete and sign. lp

## 2016-02-21 ENCOUNTER — Encounter: Payer: Self-pay | Admitting: Physician Assistant

## 2016-03-02 ENCOUNTER — Encounter: Payer: Self-pay | Admitting: Physician Assistant

## 2016-03-04 ENCOUNTER — Telehealth: Payer: Self-pay | Admitting: Physician Assistant

## 2016-03-04 ENCOUNTER — Other Ambulatory Visit: Payer: Self-pay | Admitting: Family Medicine

## 2016-03-04 NOTE — Telephone Encounter (Signed)
Please schedule fall or early winter 30 min f/u and refill until then

## 2016-03-04 NOTE — Telephone Encounter (Signed)
Signed FMLA Forms received back on [place], [place] on 02/20/16.  Patient Notified, FMLA forms faxed and copy for paitent pick up. lp

## 2016-03-04 NOTE — Telephone Encounter (Signed)
Pt wanted to scheduled a hospital f/u for next week so appt scheduled and med refilled

## 2016-03-04 NOTE — Telephone Encounter (Signed)
No recent/future appts., please advise  

## 2016-03-10 ENCOUNTER — Ambulatory Visit (INDEPENDENT_AMBULATORY_CARE_PROVIDER_SITE_OTHER): Payer: 59 | Admitting: Family Medicine

## 2016-03-10 ENCOUNTER — Encounter: Payer: Self-pay | Admitting: Family Medicine

## 2016-03-10 VITALS — BP 88/60 | HR 68 | Temp 98.5°F | Ht 70.0 in | Wt 233.5 lb

## 2016-03-10 DIAGNOSIS — I2510 Atherosclerotic heart disease of native coronary artery without angina pectoris: Secondary | ICD-10-CM

## 2016-03-10 DIAGNOSIS — Z23 Encounter for immunization: Secondary | ICD-10-CM | POA: Diagnosis not present

## 2016-03-10 DIAGNOSIS — E139 Other specified diabetes mellitus without complications: Secondary | ICD-10-CM

## 2016-03-10 DIAGNOSIS — E781 Pure hyperglyceridemia: Secondary | ICD-10-CM

## 2016-03-10 DIAGNOSIS — E785 Hyperlipidemia, unspecified: Secondary | ICD-10-CM | POA: Diagnosis not present

## 2016-03-10 DIAGNOSIS — E669 Obesity, unspecified: Secondary | ICD-10-CM | POA: Diagnosis not present

## 2016-03-10 LAB — COMPREHENSIVE METABOLIC PANEL
ALK PHOS: 45 U/L (ref 39–117)
ALT: 44 U/L (ref 0–53)
AST: 28 U/L (ref 0–37)
Albumin: 4.2 g/dL (ref 3.5–5.2)
BILIRUBIN TOTAL: 0.4 mg/dL (ref 0.2–1.2)
BUN: 16 mg/dL (ref 6–23)
CALCIUM: 9.1 mg/dL (ref 8.4–10.5)
CO2: 27 meq/L (ref 19–32)
Chloride: 108 mEq/L (ref 96–112)
Creatinine, Ser: 1.31 mg/dL (ref 0.40–1.50)
GFR: 75.39 mL/min (ref >60.00)
GLUCOSE: 142 mg/dL — AB (ref 70–99)
POTASSIUM: 3.7 meq/L (ref 3.5–5.1)
Sodium: 142 mEq/L (ref 135–145)
Total Protein: 7.2 g/dL (ref 6.0–8.3)

## 2016-03-10 LAB — LIPID PANEL
Cholesterol: 89 mg/dL (ref 0–200)
HDL: 20.6 mg/dL — AB (ref >39.00)
NONHDL: 68.06
TRIGLYCERIDES: 239 mg/dL — AB (ref 0.0–149.0)
Total CHOL/HDL Ratio: 4
VLDL: 47.8 mg/dL — AB (ref 0.0–40.0)

## 2016-03-10 LAB — LDL CHOLESTEROL, DIRECT: Direct LDL: 38 mg/dL

## 2016-03-10 NOTE — Patient Instructions (Addendum)
Aim for 30 minutes of exercise at least 5 days per week  For early diabetes-continue metformin  Avoid sweetened drinks/ avoid sweets/ avoid juices Limit carbohydrates- pasta/bread/rice potato  Keep eating low cholesterol   Follow up with me in 3 months with labs prior

## 2016-03-10 NOTE — Assessment & Plan Note (Signed)
Discussed how this problem influences overall health and the risks it imposes  Reviewed plan for weight loss with lower calorie diet (via better food choices and also portion control or program like weight watchers) and exercise building up to or more than 30 minutes 5 days per week including some aerobic activity   Exercise- as tolerated s/p cardiac stents Diet is much improved

## 2016-03-10 NOTE — Assessment & Plan Note (Signed)
Now with CAD/ hx of MI  Hx of hypertriglyceridemia  Lab today-now on lovaza and fenofibrate and atorvastatin-tolerating all   Disc goals for lipids and reasons to control them Rev labs with pt Rev low sat fat diet in detail

## 2016-03-10 NOTE — Progress Notes (Signed)
Subjective:    Patient ID: Andrew Burgess, male    DOB: [birth year], 44 y.o.   MRN: [id number]  HPI Pt here for f/u after hospitalization 8/5-8/10/17 for acute MI inferior/lateral  With stent placement   Had distal RCA occlusion- had thrombectomy and post procedure Aggrastat Then stent placement  2D echo -nl L fx   Put on ASA and Brillenta , metoprolol and atorvastatin  No ace in light of low bp   Dx with DM2 Started metformin 500 bid   Wt Readings from Last 3 Encounters:  03/10/16 233 lb 8 oz (105.9 kg)  01/31/16 232 lb (105.2 kg)  01/23/16 233 lb 6.4 oz (105.9 kg)  per pt - since the MI- has lost from 248!!!  Eating a lot differently - lean/ salads/fish  bmi is 33.5  BP Readings from Last 3 Encounters:  03/10/16 (!) 88/60  01/31/16 100/90  01/23/16 116/76   Lab Results  Component Value Date   CHOL 204 (H) 01/21/2016   HDL 23 (L) 01/21/2016   LDLCALC UNABLE TO CALCULATE IF TRIGLYCERIDE OVER 400 mg/dL 01/21/2016   LDLDIRECT 57.0 02/25/2015   TRIG 470 (H) 01/21/2016   CHOLHDL 8.9 01/21/2016  3 meds for his cholesterol  lovaza and fenofibrate and atorvastatin     Chemistry      Component Value Date/Time   NA 137 01/31/2016 1545   K 4.4 01/31/2016 1545   CL 106 01/31/2016 1545   CO2 22 01/31/2016 1545   BUN 18 01/31/2016 1545   CREATININE 1.53 (H) 01/31/2016 1545      Component Value Date/Time   CALCIUM 9.7 01/31/2016 1545   ALKPHOS 65 02/25/2015 0939   AST 26 02/25/2015 0939   ALT 36 02/25/2015 0939   BILITOT 0.3 02/25/2015 0939      Lab Results  Component Value Date   WBC 10.5 01/23/2016   HGB 12.1 (L) 01/23/2016   HCT 36.2 (L) 01/23/2016   MCV 87.2 01/23/2016   PLT 328 01/23/2016    Lab Results  Component Value Date   HGBA1C 6.7 (H) 01/21/2016    Flu shot   Walks every day for exercise or goes to the gym  Tolerating exercise well  Work 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 CAD (coronary artery disease)   . Hyperlipidemia   . Diabetes mellitus ([place])   . Status post coronary artery stent placement   . Acute MI inferior lateral first episode care ([place]) 01/18/2016  . Muscle weakness (generalized) 08/29/2012  . Stress reaction 01/19/2011  . Screen for STD (sexually transmitted disease) 01/12/2011  . Obesity 11/14/2007  . ALLERGIC RHINITIS 11/11/2007  . Hypertriglyceridemia 03/04/2007   Past Medical History:  Diagnosis Date  . Allergy   . CAD (coronary artery disease)    a. 01/2016: Peak troponin 23.15. Left heart cath on 01/21/16 showed 100% occluded mid-dist RCA occlusion with heavy thrombus burden s/p DES. Rx asp thrombectomy and post procedure Aggrastat. Re look 01/21/16 revealed persistent thrombus in prox aspect of stent as well as a distal lesion both of which were intervened on successfully with placement of 2 additional DES.  . Diabetes mellitus ([place])   . Hyperglycemia   . Hyperlipidemia    triglyceriedes   . Hypertriglyceridemia   . Obesity   . Sickle cell trait ([place])  Past Surgical History:  Procedure Laterality Date  . ANKLE SURGERY    . CARDIAC CATHETERIZATION N/A 01/18/2016   Procedure: Left Heart Cath and Coronary Angiography;  Surgeon: Jettie Booze, MD;  Location: [place] [facility];  Service: Cardiovascular;  Laterality: N/A;  . CARDIAC CATHETERIZATION N/A 01/18/2016   Procedure: Coronary Stent Intervention;  Surgeon: Jettie Booze, MD;  Location: [place] [facility];  Service: Cardiovascular;  Laterality: N/A;  MID RCA  . CARDIAC CATHETERIZATION N/A 01/21/2016   Procedure: Left Heart Cath and Coronary Angiography;  Surgeon: Troy Sine, MD;  Location: [place] [facility];  Service: Cardiovascular;  Laterality: N/A;  . CARDIAC CATHETERIZATION N/A 01/21/2016   Procedure: Coronary Stent Intervention;  Surgeon: Troy Sine, MD;  Location: [place] [facility];  Service:  Cardiovascular;  Laterality: N/A;   Social History  Substance Use Topics  . Smoking status: Former Smoker    Types: Cigars    Quit date: 01/18/2012  . Smokeless tobacco: Never Used  . Alcohol use 0.0 oz/week     Comment: rare   Family History  Problem Relation Age of Onset  . Coronary artery disease Mother   . Heart attack Mother   . Hyperlipidemia Mother   . Alcohol abuse Father   . Hyperlipidemia Father   . Hypertension Father   . Hypertension Brother   . Cancer Maternal Grandmother     brain   No Known Allergies Current Outpatient Prescriptions on File Prior to Visit  Medication Sig Dispense Refill  . ASPIRIN PO Take 81 mg by mouth daily.     [name] atorvastatin (LIPITOR) 80 MG tablet Take 1 tablet (80 mg total) by mouth daily at 6 PM. 90 tablet 3  . fenofibrate 160 MG tablet TAKE 1 TABLET BY MOUTH DAILY 30 tablet 0  . metFORMIN (GLUCOPHAGE) 500 MG tablet Take 1 tablet (500 mg total) by mouth 2 (two) times daily with a meal. 60 tablet 3  . metoprolol tartrate (LOPRESSOR) 25 MG tablet Take 1 tablet (25 mg total) by mouth 2 (two) times daily. 60 tablet 6  . nitroGLYCERIN (NITROSTAT) 0.4 MG SL tablet Place 0.4 mg under the tongue every 5 (five) minutes as needed for chest pain (3 DOSES MAX).    [name] omega-3 acid ethyl esters (LOVAZA) 1 g capsule TAKE 1 CAPSULE BY MOUTH 2 TIMES DAILY 60 capsule 0  . ticagrelor (BRILINTA) 90 MG TABS tablet Take 1 tablet (90 mg total) by mouth 2 (two) times daily. 180 tablet 3   No current facility-administered medications on file prior to visit.     Review of Systems    Review of Systems  Constitutional: Negative for fever, appetite change, fatigue and unexpected weight change.  Eyes: Negative for pain and visual disturbance.  Respiratory: Negative for cough and shortness of breath.   Cardiovascular: Negative for cp or palpitations    Gastrointestinal: Negative for nausea, diarrhea and constipation.  Genitourinary: Negative for urgency and frequency.   Skin: Negative for pallor or rash   Neurological: Negative for weakness, light-headedness, numbness and headaches.  Hematological: Negative for adenopathy. Does not bruise/bleed easily.  Psychiatric/Behavioral: Negative for dysphoric mood. The patient is not nervous/anxious.      Objective:   Physical Exam  Constitutional: He appears well-developed and well-nourished. No distress.  obese and well appearing   HENT:  Head: Normocephalic and atraumatic.  Mouth/Throat: Oropharynx is clear and moist.  Eyes: Conjunctivae and EOM are normal. Pupils are equal, round, and  reactive to light.  Neck: Normal range of motion. Neck supple. No JVD present. Carotid bruit is not present. No thyromegaly present.  Cardiovascular: Normal rate, regular rhythm, normal heart sounds and intact distal pulses.  Exam reveals no gallop.   Pulmonary/Chest: Effort normal and breath sounds normal. No respiratory distress. He has no wheezes. He has no rales.  No crackles  Abdominal: Soft. Bowel sounds are normal. He exhibits no distension, no abdominal bruit and no mass. There is no tenderness.  Musculoskeletal: He exhibits no edema.  Lymphadenopathy:    He has no cervical adenopathy.  Neurological: He is alert. He has normal reflexes.  Skin: Skin is warm and dry. No rash noted.  Psychiatric: He has a normal mood and affect.          Assessment & Plan:   Problem List Items Addressed This Visit      Cardiovascular and Mediastinum   CAD (coronary artery disease) - Primary    Pt is doing well s/p stenting  Urged to keep cardiology appts  Will work towards red of 2ndary risk factors- DM, obesity, bp and cholesterol   No episodes of chest discomfort or sob        Endocrine   Diabetes mellitus ([place])    New- prior had hyperglycemia  Obese- working hard on wt loss Lab Results  Component Value Date   HGBA1C 6.7 (H) 01/21/2016   On metformin now Known CAD F/u 3 mo after lab  May ref to DM teaching    Counseled on DM diet today        Other   Hyperlipidemia    Now with CAD/ hx of MI  Hx of hypertriglyceridemia  Lab today-now on lovaza and fenofibrate and atorvastatin-tolerating all   Disc goals for lipids and reasons to control them Rev labs with pt Rev low sat fat diet in detail       Relevant Orders   Comprehensive metabolic panel   Lipid panel   Hypertriglyceridemia   Obesity    Discussed how this problem influences overall health and the risks it imposes  Reviewed plan for weight loss with lower calorie diet (via better food choices and also portion control or program like weight watchers) and exercise building up to or more than 30 minutes 5 days per week including some aerobic activity   Exercise- as tolerated s/p cardiac stents Diet is much improved        Other Visit Diagnoses    Need for influenza vaccination       Relevant Orders   Flu Vaccine QUAD 36+ mos IM (Completed)

## 2016-03-10 NOTE — Assessment & Plan Note (Signed)
Pt is doing well s/p stenting  Urged to keep cardiology appts  Will work towards red of 2ndary risk factors- DM, obesity, bp and cholesterol   No episodes of chest discomfort or sob

## 2016-03-10 NOTE — Progress Notes (Signed)
Pre visit review using our clinic review tool, if applicable. No additional management support is needed unless otherwise documented below in the visit note. 

## 2016-03-10 NOTE — Assessment & Plan Note (Signed)
New- prior had hyperglycemia  Obese- working hard on wt loss Lab Results  Component Value Date   HGBA1C 6.7 (H) 01/21/2016   On metformin now Known CAD F/u 3 mo after lab  May ref to DM teaching  Counseled on DM diet today

## 2016-03-11 ENCOUNTER — Encounter: Payer: Self-pay | Admitting: *Deleted

## 2016-03-20 ENCOUNTER — Other Ambulatory Visit: Payer: Self-pay | Admitting: *Deleted

## 2016-03-20 DIAGNOSIS — I2510 Atherosclerotic heart disease of native coronary artery without angina pectoris: Secondary | ICD-10-CM

## 2016-03-24 ENCOUNTER — Telehealth (HOSPITAL_COMMUNITY): Payer: Self-pay

## 2016-03-24 NOTE — Telephone Encounter (Signed)
Encounter complete. 

## 2016-03-26 ENCOUNTER — Ambulatory Visit (HOSPITAL_COMMUNITY)
Admission: RE | Admit: 2016-03-26 | Discharge: 2016-03-26 | Disposition: A | Discharge status: Home / Self Care | Payer: 59 | Source: Ambulatory Visit | Attending: Cardiovascular Disease | Admitting: Cardiovascular Disease

## 2016-03-26 DIAGNOSIS — I2510 Atherosclerotic heart disease of native coronary artery without angina pectoris: Secondary | ICD-10-CM | POA: Diagnosis not present

## 2016-03-26 LAB — EXERCISE TOLERANCE TEST
CHL CUP MPHR: 173 {beats}/min
CHL CUP RESTING HR STRESS: 74 {beats}/min
CHL RATE OF PERCEIVED EXERTION: 18
CSEPEDS: 36 s
CSEPHR: 90 %
Estimated workload: 10.8 METS
Exercise duration (min): 9 min
Peak HR: 157 {beats}/min

## 2016-03-27 ENCOUNTER — Encounter: Payer: Self-pay | Admitting: *Deleted

## 2016-04-06 ENCOUNTER — Other Ambulatory Visit: Payer: Self-pay | Admitting: Family Medicine

## 2016-04-08 NOTE — Progress Notes (Signed)
HPI: Follow-up coronary artery disease. Patient had a non-ST elevation myocardial infarction in August 2017. Cardiac catheterization revealed an occluded right coronary artery. Patient had a drug-eluting stent at that time. Relook cardiac catheterization showed a distal 90% RCA lesion which was also treated with a drug-eluting stent. No disease noted in the left system. Echocardiogram August 2017 showed preserved LV function. Exercise treadmill October 2017 was normal. Since last seen, the patient denies any dyspnea on exertion, orthopnea, PND, pedal edema, palpitations, syncope or chest pain.   Current Outpatient Prescriptions  Medication Sig Dispense Refill  . ASPIRIN PO Take 81 mg by mouth daily.     [name] atorvastatin (LIPITOR) 80 MG tablet Take 1 tablet (80 mg total) by mouth daily at 6 PM. 90 tablet 3  . fenofibrate 160 MG tablet TAKE 1 TABLET BY MOUTH DAILY 30 tablet 0  . metFORMIN (GLUCOPHAGE) 500 MG tablet Take 1 tablet (500 mg total) by mouth 2 (two) times daily with a meal. 60 tablet 3  . metoprolol tartrate (LOPRESSOR) 25 MG tablet Take 1 tablet (25 mg total) by mouth 2 (two) times daily. 60 tablet 6  . nitroGLYCERIN (NITROSTAT) 0.4 MG SL tablet Place 0.4 mg under the tongue every 5 (five) minutes as needed for chest pain (3 DOSES MAX).    [name] omega-3 acid ethyl esters (LOVAZA) 1 g capsule TAKE 1 CAPSULE BY MOUTH 2 TIMES DAILY 60 capsule 0  . ticagrelor (BRILINTA) 90 MG TABS tablet Take 1 tablet (90 mg total) by mouth 2 (two) times daily. 180 tablet 3   No current facility-administered medications for this visit.      Past Medical History:  Diagnosis Date  . Allergy   . CAD (coronary artery disease)    a. 01/2016: Peak troponin 23.15. Left heart cath on 01/21/16 showed 100% occluded mid-dist RCA occlusion with heavy thrombus burden s/p DES. Rx asp thrombectomy and post procedure Aggrastat. Re look 01/21/16 revealed persistent thrombus in prox aspect of stent as well as a distal  lesion both of which were intervened on successfully with placement of 2 additional DES.  . Diabetes mellitus ([place])   . Hyperglycemia   . Hyperlipidemia    triglyceriedes   . Hypertriglyceridemia   . Obesity   . Sickle cell trait [facility])     Past Surgical History:  Procedure Laterality Date  . ANKLE SURGERY    . CARDIAC CATHETERIZATION N/A 01/18/2016   Procedure: Left Heart Cath and Coronary Angiography;  Surgeon: Jettie Booze, MD;  Location: [place] [facility];  Service: Cardiovascular;  Laterality: N/A;  . CARDIAC CATHETERIZATION N/A 01/18/2016   Procedure: Coronary Stent Intervention;  Surgeon: Jettie Booze, MD;  Location: [place] [facility];  Service: Cardiovascular;  Laterality: N/A;  MID RCA  . CARDIAC CATHETERIZATION N/A 01/21/2016   Procedure: Left Heart Cath and Coronary Angiography;  Surgeon: Troy Sine, MD;  Location: [place] [facility];  Service: Cardiovascular;  Laterality: N/A;  . CARDIAC CATHETERIZATION N/A 01/21/2016   Procedure: Coronary Stent Intervention;  Surgeon: Troy Sine, MD;  Location: [place] [facility];  Service: Cardiovascular;  Laterality: N/A;    Social History   Social History  . Marital status: Married    Spouse name: N/A  . Number of children: 2  . Years of education: N/A   Occupational History  . Truck driver    Social History Main Topics  . Smoking status: Former Smoker    Types: Cigars  Quit date: 01/18/2012  . Smokeless tobacco: Never Used  . Alcohol use 0.0 oz/week     Comment: rare  . Drug use: No  . Sexual activity: Yes   Other Topics Concern  . Not on file   Social History Narrative   Some caffeine     Family History  Problem Relation Age of Onset  . Coronary artery disease Mother   . Heart attack Mother   . Hyperlipidemia Mother   . Alcohol abuse Father   . Hyperlipidemia Father   . Hypertension Father   . Hypertension Brother   . Cancer Maternal Grandmother     brain    ROS: no fevers or chills,  productive cough, hemoptysis, dysphasia, odynophagia, melena, hematochezia, dysuria, hematuria, rash, seizure activity, orthopnea, PND, pedal edema, claudication. Remaining systems are negative.  Physical Exam: Well-developed well-nourished in no acute distress.  Skin is warm and dry.  HEENT is normal.  Neck is supple.  Chest is clear to auscultation with normal expansion.  Cardiovascular exam is regular rate and rhythm.  Abdominal exam nontender or distended. No masses palpated. Extremities show no edema. neuro grossly intact  A/P  1 CAD-continue ASA, brilinta and statin.  2 hyperlipidemia-continue statin.  3 chronic stage III renal insufficiency  Kirk Ruths, MD

## 2016-04-20 ENCOUNTER — Ambulatory Visit (INDEPENDENT_AMBULATORY_CARE_PROVIDER_SITE_OTHER): Payer: 59 | Admitting: Cardiology

## 2016-04-20 ENCOUNTER — Encounter: Payer: Self-pay | Admitting: Cardiology

## 2016-04-20 VITALS — BP 112/62 | HR 68 | Ht 72.0 in | Wt 236.0 lb

## 2016-04-20 DIAGNOSIS — E7800 Pure hypercholesterolemia, unspecified: Secondary | ICD-10-CM

## 2016-04-20 DIAGNOSIS — I2510 Atherosclerotic heart disease of native coronary artery without angina pectoris: Secondary | ICD-10-CM

## 2016-04-20 NOTE — Patient Instructions (Signed)
Your physician wants you to follow-up in: 6 MONTHS WITH DR CRENSHAW You will receive a reminder letter in the mail two months in advance. If you don't receive a letter, please call our office to schedule the follow-up appointment.   If you need a refill on your cardiac medications before your next appointment, please call your pharmacy.  

## 2016-05-04 ENCOUNTER — Other Ambulatory Visit: Payer: Self-pay | Admitting: Family Medicine

## 2016-05-16 ENCOUNTER — Other Ambulatory Visit: Payer: Self-pay | Admitting: Physician Assistant

## 2016-05-22 ENCOUNTER — Ambulatory Visit (INDEPENDENT_AMBULATORY_CARE_PROVIDER_SITE_OTHER): Payer: 59 | Admitting: Family Medicine

## 2016-05-22 ENCOUNTER — Encounter: Payer: Self-pay | Admitting: Family Medicine

## 2016-05-22 VITALS — BP 124/78 | HR 55 | Temp 98.6°F | Ht 70.0 in | Wt 241.2 lb

## 2016-05-22 DIAGNOSIS — E781 Pure hyperglyceridemia: Secondary | ICD-10-CM

## 2016-05-22 DIAGNOSIS — E7800 Pure hypercholesterolemia, unspecified: Secondary | ICD-10-CM | POA: Diagnosis not present

## 2016-05-22 DIAGNOSIS — Z6834 Body mass index (BMI) 34.0-34.9, adult: Secondary | ICD-10-CM

## 2016-05-22 DIAGNOSIS — E6609 Other obesity due to excess calories: Secondary | ICD-10-CM

## 2016-05-22 DIAGNOSIS — R6882 Decreased libido: Secondary | ICD-10-CM | POA: Diagnosis not present

## 2016-05-22 DIAGNOSIS — E119 Type 2 diabetes mellitus without complications: Secondary | ICD-10-CM | POA: Diagnosis not present

## 2016-05-22 LAB — COMPREHENSIVE METABOLIC PANEL
ALBUMIN: 4.6 g/dL (ref 3.5–5.2)
ALK PHOS: 35 U/L — AB (ref 39–117)
ALT: 33 U/L (ref 0–53)
AST: 26 U/L (ref 0–37)
BUN: 17 mg/dL (ref 6–23)
CO2: 28 mEq/L (ref 19–32)
CREATININE: 1.46 mg/dL (ref 0.40–1.50)
Calcium: 9.8 mg/dL (ref 8.4–10.5)
Chloride: 108 mEq/L (ref 96–112)
GFR: 66.47 mL/min (ref >60.00)
Glucose, Bld: 106 mg/dL — ABNORMAL HIGH (ref 70–99)
Potassium: 4.7 mEq/L (ref 3.5–5.1)
SODIUM: 142 meq/L (ref 135–145)
TOTAL PROTEIN: 7.5 g/dL (ref 6.0–8.3)
Total Bilirubin: 0.5 mg/dL (ref 0.2–1.2)

## 2016-05-22 LAB — LIPID PANEL
CHOL/HDL RATIO: 4
Cholesterol: 102 mg/dL (ref 0–200)
HDL: 27.3 mg/dL — AB (ref >39.00)
LDL Cholesterol: 46 mg/dL (ref 0–99)
NONHDL: 74.87
Triglycerides: 144 mg/dL (ref 0.0–149.0)
VLDL: 28.8 mg/dL (ref 0.0–40.0)

## 2016-05-22 LAB — TESTOSTERONE: Testosterone: 244.56 ng/dL — ABNORMAL LOW (ref 300.00–890.00)

## 2016-05-22 LAB — HEMOGLOBIN A1C: HEMOGLOBIN A1C: 5.2 % (ref 4.6–6.5)

## 2016-05-22 NOTE — Assessment & Plan Note (Signed)
.  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

## 2016-05-22 NOTE — Assessment & Plan Note (Signed)
Due for lab today  On atorvastatin and fenofibrate Fair diet Hx of CAD Disc goals for lipids and reasons to control them Rev labs with pt  (last check) Rev low sat fat diet in detail

## 2016-05-22 NOTE — Progress Notes (Signed)
Subjective:    Patient ID: Andrew Burgess, male    DOB: 02/01/1971, 45 y.o.   MRN: [id number]  HPI Here for f/u of chronic medical problems  Wt Readings from Last 3 Encounters:  05/22/16 241 lb 4 oz (109.4 kg)  04/20/16 236 lb (107 kg)  03/10/16 233 lb 8 oz (105.9 kg)  unable to fit in exercise due to working nights  Eating well / eating lots of salmon and chicken  bmi is 34.6   BP Readings from Last 3 Encounters:  05/22/16 124/78  04/20/16 112/62  03/10/16 (!) 88/60     Diabetes Home sugar results - does not check  DM diet - is watching his carbs- does eat a fair amt of carrots  Is avoiding sweets , no sugar in coffee, no sodas  Exercise -not as much  Symptoms- none  A1C last  Lab Results  Component Value Date   HGBA1C 6.7 (H) 01/21/2016   Due for A1C now  No problems with medications  Metformin   Last eye exam  - has not had ,wants a referral   No new heart issues - doing very well (CAD) and compliant with medication   Hyperlipidemia Trig and LDL With CAD  Lab Results  Component Value Date   CHOL 89 03/10/2016   HDL 20.60 (L) 03/10/2016   LDLCALC UNABLE TO CALCULATE IF TRIGLYCERIDE OVER 400 mg/dL 01/21/2016   LDLDIRECT 38.0 03/10/2016   TRIG 239.0 (H) 03/10/2016   CHOLHDL 4 03/10/2016   He takes fenofibrate and atorvastatin   Is interested in a testosterone check Tired /mood swings  Drive is down  Some ED   Of note- gets 6 hours of fragmented sleep daily  Patient Active Problem List   Diagnosis Date Noted  . Low libido 05/22/2016  . CAD (coronary artery disease)   . Hyperlipidemia   . Diabetes mellitus ([place])   . Status post coronary artery stent placement   . Acute MI inferior lateral first episode care ([place]) 01/18/2016  . Muscle weakness (generalized) 08/29/2012  . Stress reaction 01/19/2011  . Screen for STD (sexually transmitted disease) 01/12/2011  . Obesity 11/14/2007  . ALLERGIC RHINITIS 11/11/2007  . Hypertriglyceridemia  03/04/2007   Past Medical History:  Diagnosis Date  . Allergy   . CAD (coronary artery disease)    a. 01/2016: Peak troponin 23.15. Left heart cath on 01/21/16 showed 100% occluded mid-dist RCA occlusion with heavy thrombus burden s/p DES. Rx asp thrombectomy and post procedure Aggrastat. Re look 01/21/16 revealed persistent thrombus in prox aspect of stent as well as a distal lesion both of which were intervened on successfully with placement of 2 additional DES.  . Diabetes mellitus ([place])   . Hyperglycemia   . Hyperlipidemia    triglyceriedes   . Hypertriglyceridemia   . Obesity   . Sickle cell trait [facility])    Past Surgical History:  Procedure Laterality Date  . ANKLE SURGERY    . CARDIAC CATHETERIZATION N/A 01/18/2016   Procedure: Left Heart Cath and Coronary Angiography;  Surgeon: Jettie Booze, MD;  Location: [place] [facility];  Service: Cardiovascular;  Laterality: N/A;  . CARDIAC CATHETERIZATION N/A 01/18/2016   Procedure: Coronary Stent Intervention;  Surgeon: Jettie Booze, MD;  Location: [place] [facility];  Service: Cardiovascular;  Laterality: N/A;  MID RCA  . CARDIAC CATHETERIZATION N/A 01/21/2016   Procedure: Left Heart Cath and Coronary Angiography;  Surgeon: Troy Sine, MD;  Location: [place]  [facility];  Service: Cardiovascular;  Laterality: N/A;  . CARDIAC CATHETERIZATION N/A 01/21/2016   Procedure: Coronary Stent Intervention;  Surgeon: Troy Sine, MD;  Location: [place] [facility];  Service: Cardiovascular;  Laterality: N/A;   Social History  Substance Use Topics  . Smoking status: Former Smoker    Types: Cigars    Quit date: 01/18/2012  . Smokeless tobacco: Never Used  . Alcohol use 0.0 oz/week     Comment: rare   Family History  Problem Relation Age of Onset  . Coronary artery disease Mother   . Heart attack Mother   . Hyperlipidemia Mother   . Alcohol abuse Father   . Hyperlipidemia Father   . Hypertension Father   . Hypertension Brother   .  Cancer Maternal Grandmother     brain   No Known Allergies Current Outpatient Prescriptions on File Prior to Visit  Medication Sig Dispense Refill  . ASPIRIN PO Take 81 mg by mouth daily.     [name] atorvastatin (LIPITOR) 80 MG tablet Take 1 tablet (80 mg total) by mouth daily at 6 PM. 90 tablet 3  . fenofibrate 160 MG tablet TAKE 1 TABLET BY MOUTH DAILY 30 tablet 0  . metFORMIN (GLUCOPHAGE) 500 MG tablet Take 1 tablet (500 mg total) by mouth 2 (two) times daily with a meal. 60 tablet 3  . metoprolol tartrate (LOPRESSOR) 25 MG tablet Take 1 tablet (25 mg total) by mouth 2 (two) times daily. 60 tablet 6  . nitroGLYCERIN (NITROSTAT) 0.4 MG SL tablet Place 0.4 mg under the tongue every 5 (five) minutes as needed for chest pain (3 DOSES MAX).    [name] omega-3 acid ethyl esters (LOVAZA) 1 g capsule TAKE 1 CAPSULE BY MOUTH 2 TIMES DAILY 60 capsule 0  . ticagrelor (BRILINTA) 90 MG TABS tablet Take 1 tablet (90 mg total) by mouth 2 (two) times daily. 180 tablet 3   No current facility-administered medications on file prior to visit.     Review of Systems Review of Systems  Constitutional: Negative for fever, appetite change, and unexpected weight change. pos for fatigue  Eyes: Negative for pain and visual disturbance.  Respiratory: Negative for cough and shortness of breath.   Cardiovascular: Negative for cp or palpitations    Gastrointestinal: Negative for nausea, diarrhea and constipation.  Genitourinary: Negative for urgency and frequency.  Skin: Negative for pallor or rash   Neurological: Negative for weakness, light-headedness, numbness and headaches.  Hematological: Negative for adenopathy. Does not bruise/bleed easily.  Psychiatric/Behavioral: Negative for dysphoric mood. The patient is not nervous/anxious.         Objective:   Physical Exam  Constitutional: He appears well-developed and well-nourished. No distress.  obese and well appearing   HENT:  Head: Normocephalic and atraumatic.    Mouth/Throat: Oropharynx is clear and moist.  Eyes: Conjunctivae and EOM are normal. Pupils are equal, round, and reactive to light.  Neck: Normal range of motion. Neck supple. No JVD present. Carotid bruit is not present. No thyromegaly present.  Cardiovascular: Normal rate, regular rhythm, normal heart sounds and intact distal pulses.  Exam reveals no gallop.   Pulmonary/Chest: Effort normal and breath sounds normal. No respiratory distress. He has no wheezes. He has no rales.  No crackles  Abdominal: Soft. Bowel sounds are normal. He exhibits no distension, no abdominal bruit and no mass. There is no tenderness.  Musculoskeletal: He exhibits no edema.  Lymphadenopathy:    He has no cervical adenopathy.  Neurological: He is alert. He has normal reflexes.  Skin: Skin is warm and dry. No rash noted.  Psychiatric: He has a normal mood and affect.          Assessment & Plan:   Problem List Items Addressed This Visit      Endocrine   Diabetes mellitus ([place]) - Primary    Lab Results  Component Value Date   HGBA1C 6.7 (H) 01/21/2016   Due for A1C today  Thinks he is doing fairly well with diet but less exercise due to work hours-he will continue to work on that  Consider Dm ed for the future       Relevant Orders   Ambulatory referral to Ophthalmology   Comprehensive metabolic panel (Completed)   Hemoglobin A1c (Completed)     Other   Obesity    .Discussed how this problem influences overall health and the risks it imposes  Reviewed plan for weight loss with lower calorie diet (via better food choices and also portion control or program like weight watchers) and exercise building up to or more than 30 minutes 5 days per week including some aerobic activity   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      Low libido    Pt requests testosterone check   Poss work / less sleep and CAD with meds may contrib to this and ED as well       Relevant Orders    Testosterone (Completed)   Hypertriglyceridemia    Lab today  On fenofibrate and diet  Eating lean protein      Relevant Orders   Lipid panel (Completed)   Hyperlipidemia    Due for lab today  On atorvastatin and fenofibrate Fair diet Hx of CAD Disc goals for lipids and reasons to control them Rev labs with pt  (last check) Rev low sat fat diet in detail       Relevant Orders   Lipid panel (Completed)      Problem List Items Addressed This Visit      Endocrine   Diabetes mellitus ([place]) - Primary    Lab Results  Component Value Date   HGBA1C 6.7 (H) 01/21/2016   Due for A1C today  Thinks he is doing fairly well with diet but less exercise due to work hours-he will continue to work on that  Consider Dm ed for the future       Relevant Orders   Ambulatory referral to Ophthalmology   Comprehensive metabolic panel (Completed)   Hemoglobin A1c (Completed)     Other   Obesity    .Discussed how this problem influences overall health and the risks it imposes  Reviewed plan for weight loss with lower calorie diet (via better food choices and also portion control or program like weight watchers) and exercise building up to or more than 30 minutes 5 days per week including some aerobic activity   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

## 2016-05-22 NOTE — Assessment & Plan Note (Signed)
Lab Results  Component Value Date   HGBA1C 6.7 (H) 01/21/2016   Due for A1C today  Thinks he is doing fairly well with diet but less exercise due to work hours-he will continue to work on that  Consider Dm ed for the future

## 2016-05-22 NOTE — Assessment & Plan Note (Signed)
Pt requests testosterone check   Poss work / less sleep and CAD with meds may contrib to this and ED as well

## 2016-05-22 NOTE — Patient Instructions (Addendum)
Eat low fat and low carb  Stop at check out for referral for diabetic eye exam  Labs today  We will also check a testosterone level   Try to get back to exercise when you can Also try your best to get enough sleep    Take care of yourself

## 2016-05-22 NOTE — Assessment & Plan Note (Signed)
Lab today  On fenofibrate and diet  Eating lean protein

## 2016-05-24 ENCOUNTER — Telehealth: Payer: Self-pay | Admitting: Family Medicine

## 2016-05-24 NOTE — Telephone Encounter (Signed)
-----   Message from Marchia Bond sent at 05/18/2016  2:58 PM EST ----- Regarding: 3 mo f/u labs mon 12/11, need orders. Thanks :-) Please order  future f/u labs for pt's upcoming lab appt. Thanks Aniceto Boss

## 2016-05-25 ENCOUNTER — Other Ambulatory Visit: Payer: 59

## 2016-05-25 ENCOUNTER — Other Ambulatory Visit: Payer: Self-pay | Admitting: Family Medicine

## 2016-05-25 LAB — HM DIABETES EYE EXAM

## 2016-05-26 ENCOUNTER — Encounter: Payer: Self-pay | Admitting: *Deleted

## 2016-06-01 ENCOUNTER — Ambulatory Visit: Payer: 59 | Admitting: Family Medicine

## 2016-06-02 ENCOUNTER — Telehealth: Payer: Self-pay | Admitting: Family Medicine

## 2016-06-02 DIAGNOSIS — R7989 Other specified abnormal findings of blood chemistry: Secondary | ICD-10-CM | POA: Insufficient documentation

## 2016-06-02 NOTE — Telephone Encounter (Signed)
Patient received his lab results.  Patient would like a referral to Urology.  Patient would like to go to [place].  Patient would like an early Monday morning appointment.

## 2016-06-02 NOTE — Telephone Encounter (Signed)
Ref done Will route to [facility]  

## 2016-06-07 ENCOUNTER — Other Ambulatory Visit: Payer: Self-pay | Admitting: Family Medicine

## 2016-06-18 DIAGNOSIS — E119 Type 2 diabetes mellitus without complications: Secondary | ICD-10-CM | POA: Diagnosis not present

## 2016-06-22 ENCOUNTER — Other Ambulatory Visit: Payer: Self-pay | Admitting: Family Medicine

## 2016-07-20 ENCOUNTER — Encounter (HOSPITAL_COMMUNITY): Payer: Self-pay | Admitting: Family Medicine

## 2016-07-28 DIAGNOSIS — E291 Testicular hypofunction: Secondary | ICD-10-CM | POA: Diagnosis not present

## 2016-07-28 DIAGNOSIS — Z125 Encounter for screening for malignant neoplasm of prostate: Secondary | ICD-10-CM | POA: Diagnosis not present

## 2016-08-04 DIAGNOSIS — Z125 Encounter for screening for malignant neoplasm of prostate: Secondary | ICD-10-CM | POA: Diagnosis not present

## 2016-08-04 DIAGNOSIS — E291 Testicular hypofunction: Secondary | ICD-10-CM | POA: Diagnosis not present

## 2016-08-08 ENCOUNTER — Other Ambulatory Visit: Payer: Self-pay | Admitting: Physician Assistant

## 2016-08-10 DIAGNOSIS — E291 Testicular hypofunction: Secondary | ICD-10-CM | POA: Diagnosis not present

## 2016-09-07 DIAGNOSIS — E291 Testicular hypofunction: Secondary | ICD-10-CM | POA: Diagnosis not present

## 2016-09-08 ENCOUNTER — Telehealth: Payer: Self-pay

## 2016-09-08 NOTE — Telephone Encounter (Signed)
Pt left v/m said that Dr Tresa Moore urologist had given pt Cloniphene and was too expensive for pt and wanted to know if our office could help. We do not have samples and I called Dr Zettie Pho office and spoke with Glenard Haring, she said if pt cannot afford med for pt to call Dr Zettie Pho office and she will send note to Dr Tresa Moore for pt. Per DPR left detailed message of this info on pts v/m.

## 2016-09-08 NOTE — Telephone Encounter (Signed)
Pt returned call - and verbalized understanding to call Dr Zettie Pho office

## 2016-11-08 ENCOUNTER — Telehealth: Payer: Self-pay | Admitting: Family Medicine

## 2016-11-08 DIAGNOSIS — Z0000 Encounter for general adult medical examination without abnormal findings: Secondary | ICD-10-CM | POA: Insufficient documentation

## 2016-11-08 DIAGNOSIS — E119 Type 2 diabetes mellitus without complications: Secondary | ICD-10-CM

## 2016-11-08 NOTE — Telephone Encounter (Signed)
-----   Message from Marchia Bond sent at 11/04/2016  1:27 PM EDT ----- Regarding: Cpx labs Mon 6/4, need orders. Thanks! :-) Please order  future cpx labs for pt's upcoming lab appt. Thanks Aniceto Boss

## 2016-11-10 ENCOUNTER — Other Ambulatory Visit: Payer: Self-pay | Admitting: Family Medicine

## 2016-11-11 ENCOUNTER — Other Ambulatory Visit: Payer: Self-pay | Admitting: Physician Assistant

## 2016-11-11 NOTE — Telephone Encounter (Signed)
Rx has been sent to the pharmacy electronically. ° °

## 2016-11-16 ENCOUNTER — Other Ambulatory Visit (INDEPENDENT_AMBULATORY_CARE_PROVIDER_SITE_OTHER): Payer: 59

## 2016-11-16 DIAGNOSIS — Z0000 Encounter for general adult medical examination without abnormal findings: Secondary | ICD-10-CM

## 2016-11-16 DIAGNOSIS — E119 Type 2 diabetes mellitus without complications: Secondary | ICD-10-CM | POA: Diagnosis not present

## 2016-11-16 LAB — CBC WITH DIFFERENTIAL/PLATELET
Basophils Absolute: 0 10*3/uL (ref 0.0–0.1)
Basophils Relative: 0.4 % (ref 0.0–3.0)
EOS ABS: 0.2 10*3/uL (ref 0.0–0.7)
Eosinophils Relative: 2.6 % (ref 0.0–5.0)
HCT: 37.6 % — ABNORMAL LOW (ref 39.0–52.0)
HEMOGLOBIN: 12.5 g/dL — AB (ref 13.0–17.0)
Lymphocytes Relative: 40.7 % (ref 12.0–46.0)
Lymphs Abs: 3 10*3/uL (ref 0.7–4.0)
MCHC: 33.2 g/dL (ref 30.0–36.0)
MCV: 88.4 fl (ref 78.0–100.0)
MONO ABS: 0.5 10*3/uL (ref 0.1–1.0)
Monocytes Relative: 6.9 % (ref 3.0–12.0)
Neutro Abs: 3.7 10*3/uL (ref 1.4–7.7)
Neutrophils Relative %: 49.4 % (ref 43.0–77.0)
Platelets: 252 10*3/uL (ref 150.0–400.0)
RBC: 4.26 Mil/uL (ref 4.22–5.81)
RDW: 13.2 % (ref 11.5–15.5)
WBC: 7.5 10*3/uL (ref 4.0–10.5)

## 2016-11-16 LAB — COMPREHENSIVE METABOLIC PANEL
ALBUMIN: 4.3 g/dL (ref 3.5–5.2)
ALK PHOS: 45 U/L (ref 39–117)
ALT: 33 U/L (ref 0–53)
AST: 27 U/L (ref 0–37)
BILIRUBIN TOTAL: 0.3 mg/dL (ref 0.2–1.2)
BUN: 19 mg/dL (ref 6–23)
CO2: 23 mEq/L (ref 19–32)
CREATININE: 1.41 mg/dL (ref 0.40–1.50)
Calcium: 9.3 mg/dL (ref 8.4–10.5)
Chloride: 110 mEq/L (ref 96–112)
GFR: 69.05 mL/min (ref >60.00)
Glucose, Bld: 123 mg/dL — ABNORMAL HIGH (ref 70–99)
Potassium: 4 mEq/L (ref 3.5–5.1)
SODIUM: 139 meq/L (ref 135–145)
TOTAL PROTEIN: 7.1 g/dL (ref 6.0–8.3)

## 2016-11-16 LAB — TSH: TSH: 2.44 u[IU]/mL (ref 0.35–4.50)

## 2016-11-16 LAB — LIPID PANEL
Cholesterol: 96 mg/dL (ref 0–200)
HDL: 22.8 mg/dL — ABNORMAL LOW (ref >39.00)
NonHDL: 72.94
Total CHOL/HDL Ratio: 4
Triglycerides: 224 mg/dL — ABNORMAL HIGH (ref 0.0–149.0)
VLDL: 44.8 mg/dL — AB (ref 0.0–40.0)

## 2016-11-16 LAB — LDL CHOLESTEROL, DIRECT: LDL DIRECT: 34 mg/dL

## 2016-11-16 LAB — HEMOGLOBIN A1C: HEMOGLOBIN A1C: 5.8 % (ref 4.6–6.5)

## 2016-11-23 ENCOUNTER — Encounter: Payer: Self-pay | Admitting: Family Medicine

## 2016-11-23 ENCOUNTER — Ambulatory Visit (INDEPENDENT_AMBULATORY_CARE_PROVIDER_SITE_OTHER): Payer: 59 | Admitting: Family Medicine

## 2016-11-23 VITALS — BP 108/64 | HR 68 | Temp 98.1°F | Wt 242.8 lb

## 2016-11-23 DIAGNOSIS — Z23 Encounter for immunization: Secondary | ICD-10-CM

## 2016-11-23 DIAGNOSIS — E7800 Pure hypercholesterolemia, unspecified: Secondary | ICD-10-CM | POA: Diagnosis not present

## 2016-11-23 DIAGNOSIS — E119 Type 2 diabetes mellitus without complications: Secondary | ICD-10-CM

## 2016-11-23 DIAGNOSIS — E781 Pure hyperglyceridemia: Secondary | ICD-10-CM | POA: Diagnosis not present

## 2016-11-23 DIAGNOSIS — E6609 Other obesity due to excess calories: Secondary | ICD-10-CM

## 2016-11-23 DIAGNOSIS — Z6834 Body mass index (BMI) 34.0-34.9, adult: Secondary | ICD-10-CM

## 2016-11-23 LAB — MICROALBUMIN / CREATININE URINE RATIO
Creatinine,U: 98.8 mg/dL
MICROALB UR: 2.4 mg/dL — AB (ref 0.0–1.9)
Microalb Creat Ratio: 2.4 mg/g (ref 0.0–30.0)

## 2016-11-23 MED ORDER — METFORMIN HCL 500 MG PO TABS: ORAL_TABLET | ORAL | 11 refills | Status: DC

## 2016-11-23 MED ORDER — OMEGA-3-ACID ETHYL ESTERS 1 G PO CAPS: ORAL_CAPSULE | ORAL | 11 refills | Status: DC

## 2016-11-23 NOTE — Assessment & Plan Note (Signed)
Lab Results  Component Value Date   HGBA1C 5.8 11/16/2016   Very well controlled Pt has poor understanding of dm diet but declines classes due to his schedule  rec the book Sugar Busters and disc carb counting  Wt loss would help  Tdap today  Also urine microalbumin Continue exercise

## 2016-11-23 NOTE — Addendum Note (Signed)
Addended by: Marchia Bond on: 11/23/2016 01:48 PM   Modules accepted: Orders

## 2016-11-23 NOTE — Patient Instructions (Addendum)
Stop all juice and beverages with sugar   You are due for a tetanus shot (Tdap) - we will give that today   Urine microalbumin test today (for kidney health with diabetes)  Keep exercising   Keep working on weight loss  Follow up in 6 months with labs prior

## 2016-11-23 NOTE — Assessment & Plan Note (Signed)
Discussed how this problem influences overall health and the risks it imposes  Reviewed plan for weight loss with lower calorie diet (via better food choices and also portion control or program like weight watchers) and exercise building up to or more than 30 minutes 5 days per week including some aerobic activity    

## 2016-11-23 NOTE — Assessment & Plan Note (Signed)
On Lovaza and exercising  Up a bit Disc diet

## 2016-11-23 NOTE — Assessment & Plan Note (Signed)
Disc goals for lipids and reasons to control them Rev labs with pt Rev low sat fat diet in detail  Trig up- disc watching fat and carbs in diet  HDL still low- continue lovaza and exercise  LDL is at goal for DM and CAD

## 2016-11-23 NOTE — Progress Notes (Signed)
Subjective:    Patient ID: Andrew Burgess, male    DOB: August 15, 1970, 46 y.o.   MRN: [id number]  HPI Here for 6 mo f/u of chronic health problems   Working a lot  Not watching diet as well as he was  Walks and jumps rope when he can  Wt Readings from Last 3 Encounters:  11/23/16 242 lb 12 oz (110.1 kg)  05/22/16 241 lb 4 oz (109.4 kg)  04/20/16 236 lb (107 kg)  stable  bmi 34.8   Diabetes Home sugar results -no excessive highs or lows DM diet - getting rid of sugar drinks  Exercise - walking and jumping rope  Symptoms-none  A1C last  Lab Results  Component Value Date   HGBA1C 5.8 11/16/2016  very good control   No problems with medications - metformin 500 bid  Renal protection-not on ace/ due for microalbumin Last eye exam  -utd   BP Readings from Last 3 Encounters:  11/23/16 108/64  05/22/16 124/78  04/20/16 112/62     Chemistry      Component Value Date/Time   NA 139 11/16/2016 0839   K 4.0 11/16/2016 0839   CL 110 11/16/2016 0839   CO2 23 11/16/2016 0839   BUN 19 11/16/2016 0839   CREATININE 1.41 11/16/2016 0839   CREATININE 1.53 (H) 01/31/2016 1545      Component Value Date/Time   CALCIUM 9.3 11/16/2016 0839   ALKPHOS 45 11/16/2016 0839   AST 27 11/16/2016 0839   ALT 33 11/16/2016 0839   BILITOT 0.3 11/16/2016 0839       Hyperlipidemia Lab Results  Component Value Date   CHOL 96 11/16/2016   CHOL 102 05/22/2016   CHOL 89 03/10/2016   Lab Results  Component Value Date   HDL 22.80 (L) 11/16/2016   HDL 27.30 (L) 05/22/2016   HDL 20.60 (L) 03/10/2016   Lab Results  Component Value Date   LDLCALC 46 05/22/2016   LDLCALC UNABLE TO CALCULATE IF TRIGLYCERIDE OVER 400 mg/dL 01/21/2016   LDLCALC See Comment mg/dL 09/06/2009   Lab Results  Component Value Date   TRIG 224.0 (H) 11/16/2016   TRIG 144.0 05/22/2016   TRIG 239.0 (H) 03/10/2016   Lab Results  Component Value Date   CHOLHDL 4 11/16/2016   CHOLHDL 4 05/22/2016   CHOLHDL 4  03/10/2016   Lab Results  Component Value Date   LDLDIRECT 34.0 11/16/2016   LDLDIRECT 38.0 03/10/2016   LDLDIRECT 57.0 02/25/2015   On atorvastatin and fenofibrate and lovaza  LDL is at goal  Trig 224 - up a bit  HDL -still too low   Blood count is ok -stable Lab Results  Component Value Date   WBC 7.5 11/16/2016   HGB 12.5 (L) 11/16/2016   HCT 37.6 (L) 11/16/2016   MCV 88.4 11/16/2016   PLT 252.0 11/16/2016    Thyroid ok Lab Results  Component Value Date   TSH 2.44 11/16/2016     Patient Active Problem List   Diagnosis Date Noted  . Routine general medical examination at a health care facility 11/08/2016  . Low testosterone in male 06/02/2016  . Low libido 05/22/2016  . CAD (coronary artery disease)   . Hyperlipidemia   . Diabetes mellitus ([place])   . Status post coronary artery stent placement   . Acute MI inferior lateral first episode care ([place]) 01/18/2016  . Muscle weakness (generalized) 08/29/2012  . Stress reaction 01/19/2011  . Screen for STD (sexually  transmitted disease) 01/12/2011  . Obesity 11/14/2007  . ALLERGIC RHINITIS 11/11/2007  . Hypertriglyceridemia 03/04/2007   Past Medical History:  Diagnosis Date  . Allergy   . CAD (coronary artery disease)    a. 01/2016: Peak troponin 23.15. Left heart cath on 01/21/16 showed 100% occluded mid-dist RCA occlusion with heavy thrombus burden s/p DES. Rx asp thrombectomy and post procedure Aggrastat. Re look 01/21/16 revealed persistent thrombus in prox aspect of stent as well as a distal lesion both of which were intervened on successfully with placement of 2 additional DES.  . Diabetes mellitus ([place])   . Hyperglycemia   . Hyperlipidemia    triglyceriedes   . Hypertriglyceridemia   . Obesity   . Sickle cell trait [facility])    Past Surgical History:  Procedure Laterality Date  . ANKLE SURGERY    . CARDIAC CATHETERIZATION N/A 01/18/2016   Procedure: Left Heart Cath and Coronary Angiography;  Surgeon: Jettie Booze, MD;  Location: [place] [facility];  Service: Cardiovascular;  Laterality: N/A;  . CARDIAC CATHETERIZATION N/A 01/18/2016   Procedure: Coronary Stent Intervention;  Surgeon: Jettie Booze, MD;  Location: [place] [facility];  Service: Cardiovascular;  Laterality: N/A;  MID RCA  . CARDIAC CATHETERIZATION N/A 01/21/2016   Procedure: Left Heart Cath and Coronary Angiography;  Surgeon: Troy Sine, MD;  Location: [place] [facility];  Service: Cardiovascular;  Laterality: N/A;  . CARDIAC CATHETERIZATION N/A 01/21/2016   Procedure: Coronary Stent Intervention;  Surgeon: Troy Sine, MD;  Location: [place] [facility];  Service: Cardiovascular;  Laterality: N/A;   Social History  Substance Use Topics  . Smoking status: Former Smoker    Types: Cigars    Quit date: 01/18/2012  . Smokeless tobacco: Never Used  . Alcohol use 0.0 oz/week     Comment: rare   Family History  Problem Relation Age of Onset  . Coronary artery disease Mother   . Heart attack Mother   . Hyperlipidemia Mother   . Alcohol abuse Father   . Hyperlipidemia Father   . Hypertension Father   . Hypertension Brother   . Cancer Maternal Grandmother        brain   No Known Allergies Current Outpatient Prescriptions on File Prior to Visit  Medication Sig Dispense Refill  . ASPIRIN PO Take 81 mg by mouth daily.     [name] atorvastatin (LIPITOR) 80 MG tablet Take 1 tablet (80 mg total) by mouth daily at 6 PM. 90 tablet 3  . BRILINTA 90 MG TABS tablet TAKE 1 TABLET (90 MG TOTAL) BY MOUTH 2 (TWO) TIMES DAILY. 180 tablet 1  . fenofibrate 160 MG tablet TAKE 1 TABLET BY MOUTH DAILY 30 tablet 5  . metoprolol tartrate (LOPRESSOR) 25 MG tablet TAKE 1 TABLET (25 MG TOTAL) BY MOUTH 2 (TWO) TIMES DAILY. 60 tablet 6  . nitroGLYCERIN (NITROSTAT) 0.4 MG SL tablet Place 0.4 mg under the tongue every 5 (five) minutes as needed for chest pain (3 DOSES MAX).     No current facility-administered medications on file prior to visit.       Review of Systems Review of Systems  Constitutional: Negative for fever, appetite change,  and unexpected weight change. 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  Respiratory: Negative for cough and shortness of breath.   Cardiovascular: Negative for cp or palpitations  Gastrointestinal: Negative for nausea, diarrhea and constipation.  Genitourinary: Negative for urgency and frequency. neg for excessive thirst  Skin: Negative for pallor or rash   Neurological: Negative for weakness, light-headedness, numbness and headaches.  Hematological: Negative for adenopathy. Does not bruise/bleed easily.  Psychiatric/Behavioral: Negative for dysphoric mood. The patient is not nervous/anxious.         Objective:   Physical Exam  Constitutional: He appears well-developed and well-nourished. No distress.  obese and well appearing   HENT:  Head: Normocephalic and atraumatic.  Mouth/Throat: Oropharynx is clear and moist.  Eyes: Conjunctivae and EOM are normal. Pupils are equal, round, and reactive to light.  Neck: Normal range of motion. Neck supple. No JVD present. Carotid bruit is not present. No thyromegaly present.  Cardiovascular: Normal rate, regular rhythm, normal heart sounds and intact distal pulses.  Exam reveals no gallop.   Pulmonary/Chest: Effort normal and breath sounds normal. No respiratory distress. He has no wheezes. He has no rales.  No crackles  Abdominal: Soft. Bowel sounds are normal. He exhibits no distension, no abdominal bruit and no mass. There is no tenderness.  Musculoskeletal: He exhibits no edema.  Lymphadenopathy:    He has no cervical adenopathy.  Neurological: He is alert. He has normal reflexes.  Skin: Skin is warm and dry. No rash noted.  Psychiatric: He has a normal mood and affect.          Assessment & Plan:   Problem List Items Addressed This Visit       Endocrine   Diabetes mellitus ([place]) - Primary    Lab Results  Component Value Date   HGBA1C 5.8 11/16/2016   Very well controlled Pt has poor understanding of dm diet but declines classes due to his schedule  rec the book Sugar Busters and disc carb counting  Wt loss would help  Tdap today  Also urine microalbumin Continue exercise       Relevant Medications   metFORMIN (GLUCOPHAGE) 500 MG tablet   Other Relevant Orders   Microalbumin / creatinine urine ratio     Other   Hyperlipidemia    Disc goals for lipids and reasons to control them Rev labs with pt Rev low sat fat diet in detail  Trig up- disc watching fat and carbs in diet  HDL still low- continue lovaza and exercise  LDL is at goal for DM and CAD      Relevant Medications   omega-3 acid ethyl esters (LOVAZA) 1 g capsule   Hypertriglyceridemia    On Lovaza and exercising  Up a bit Disc diet        Relevant Medications   omega-3 acid ethyl esters (LOVAZA) 1 g capsule   Obesity    Discussed how this problem influences overall health and the risks it imposes  Reviewed plan for weight loss with lower calorie diet (via better food choices and also portion control or program like weight watchers) and exercise building up to or more than 30 minutes 5 days per week including some aerobic activity         Relevant Medications   metFORMIN (GLUCOPHAGE) 500 MG tablet

## 2016-11-24 ENCOUNTER — Encounter: Payer: Self-pay | Admitting: *Deleted

## 2016-12-01 ENCOUNTER — Other Ambulatory Visit: Payer: Self-pay | Admitting: Family Medicine

## 2016-12-13 ENCOUNTER — Other Ambulatory Visit: Payer: Self-pay | Admitting: Family Medicine

## 2016-12-16 IMAGING — DX DG CHEST 2V
2 series · 2 of 2 positions shown · non-contrast
Comparison: None.

CLINICAL DATA: 44-year-old male with a history of intermittent
shortness of breath and fever. Left-sided chest pain.

EXAM:
CHEST  2 VIEW

[chest pa]
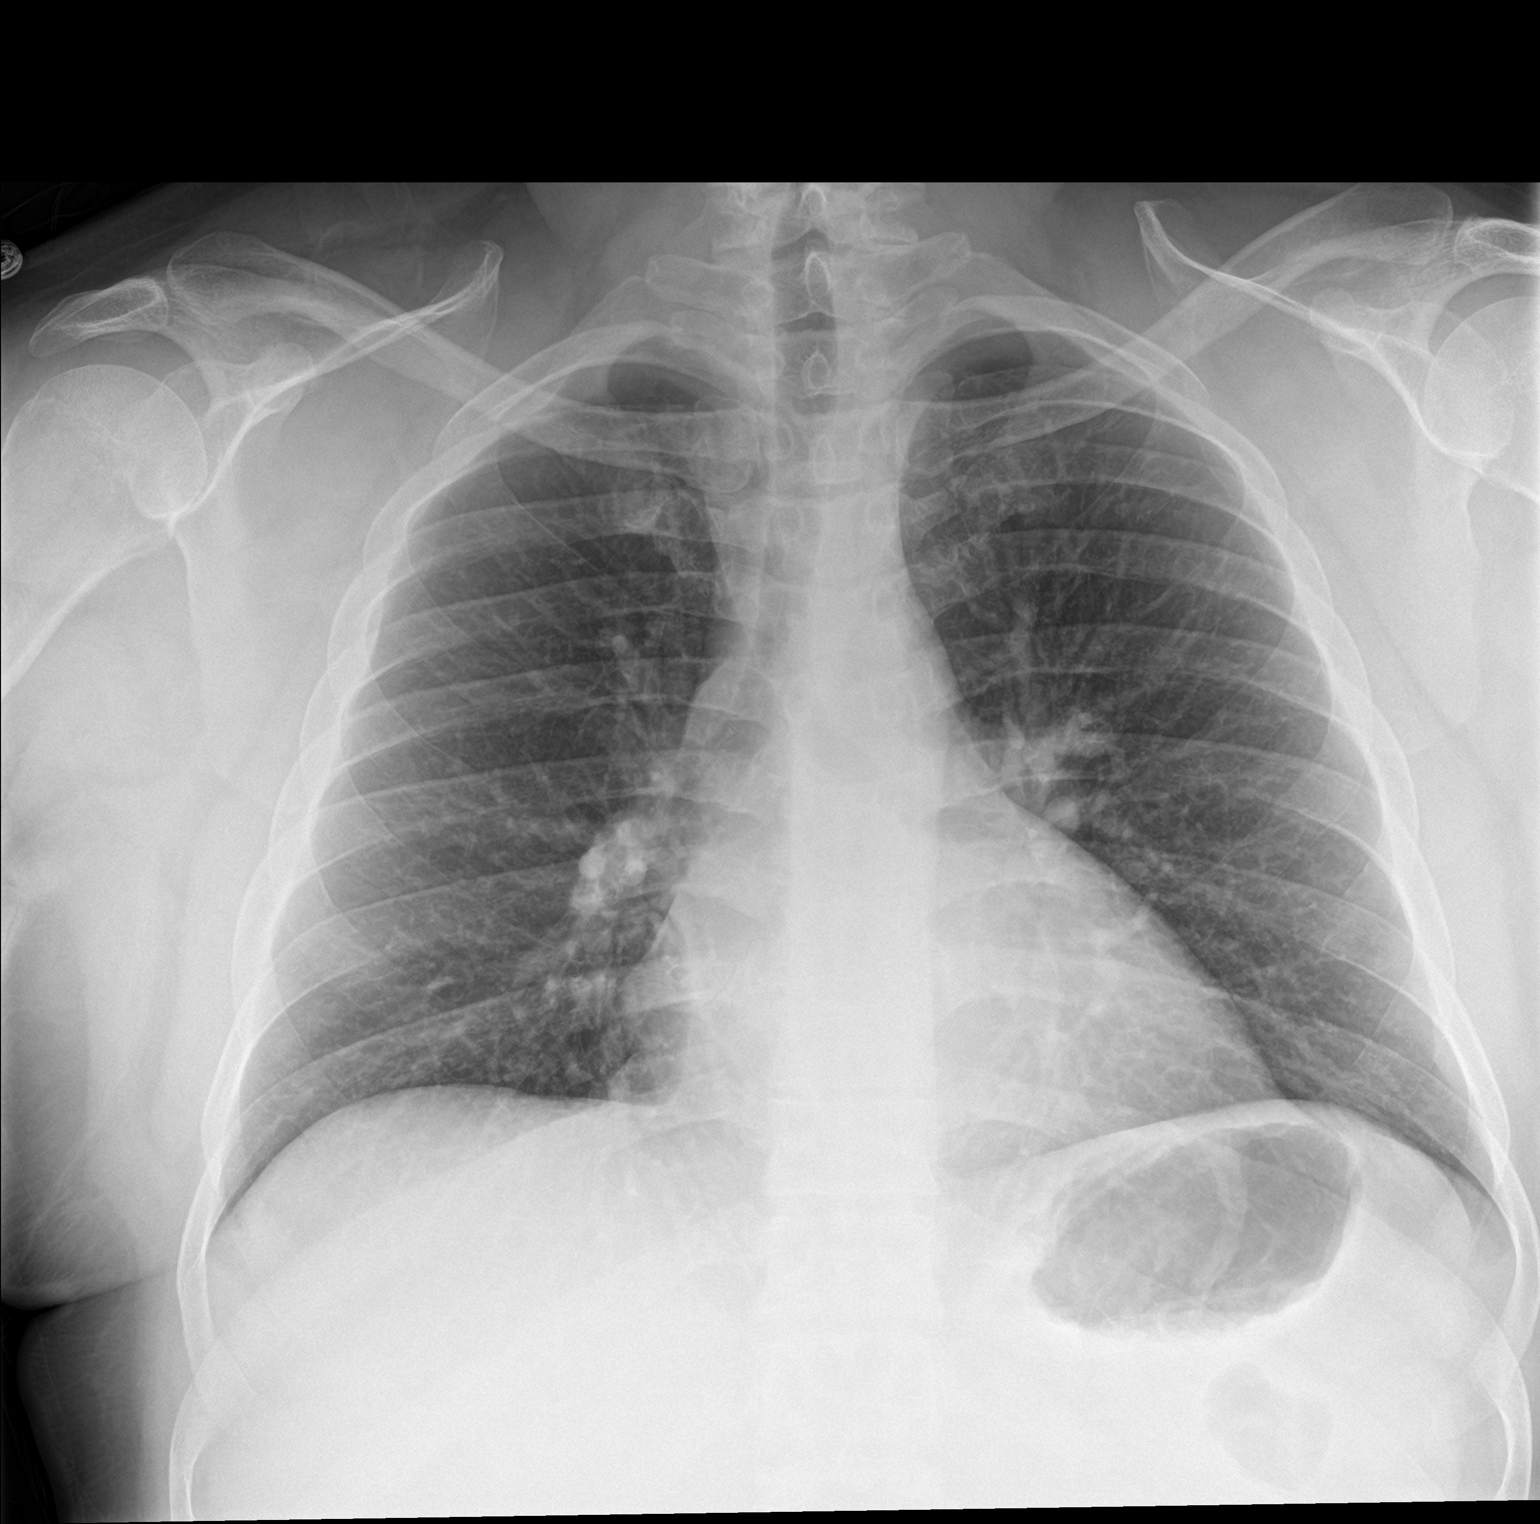

[chest lat]
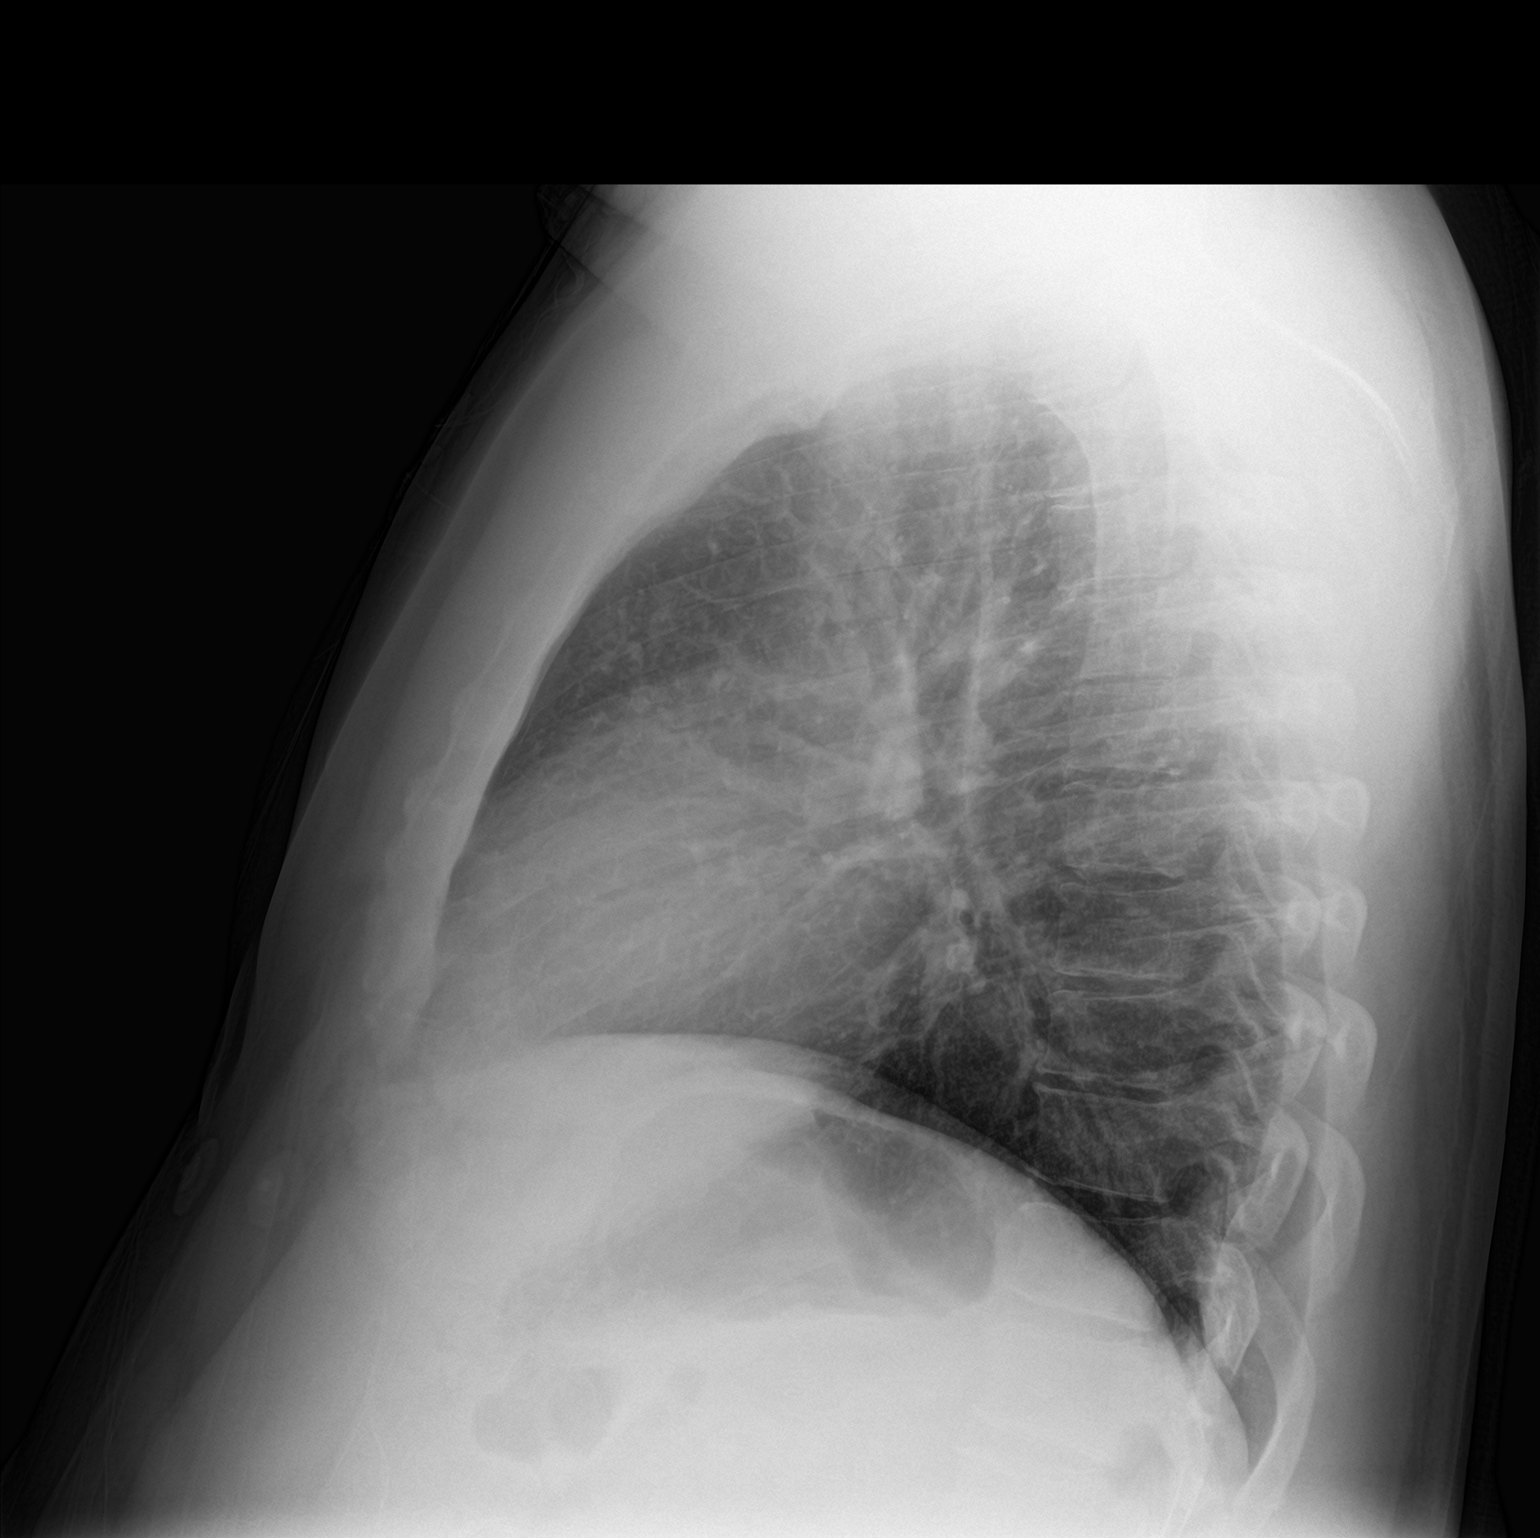

[2 of 2 positions shown; findings below may reference images not displayed]

FINDINGS: The heart size and mediastinal contours are within normal limits.
Both lungs are clear. The visualized skeletal structures are
unremarkable.
IMPRESSION: No radiographic evidence of acute cardiopulmonary disease.

## 2017-01-14 ENCOUNTER — Other Ambulatory Visit: Payer: Self-pay | Admitting: Physician Assistant

## 2017-01-14 NOTE — Telephone Encounter (Signed)
This is Dr. Crenshaw's pt 

## 2017-03-01 ENCOUNTER — Encounter: Payer: Self-pay | Admitting: Physician Assistant

## 2017-03-01 ENCOUNTER — Ambulatory Visit (INDEPENDENT_AMBULATORY_CARE_PROVIDER_SITE_OTHER): Payer: 59 | Admitting: Physician Assistant

## 2017-03-01 VITALS — BP 120/80 | HR 61 | Ht 70.0 in | Wt 244.4 lb

## 2017-03-01 DIAGNOSIS — I2510 Atherosclerotic heart disease of native coronary artery without angina pectoris: Secondary | ICD-10-CM

## 2017-03-01 DIAGNOSIS — E119 Type 2 diabetes mellitus without complications: Secondary | ICD-10-CM

## 2017-03-01 DIAGNOSIS — E785 Hyperlipidemia, unspecified: Secondary | ICD-10-CM

## 2017-03-01 DIAGNOSIS — I10 Essential (primary) hypertension: Secondary | ICD-10-CM

## 2017-03-01 NOTE — Patient Instructions (Signed)
Medication Instructions:   No changes to current regimen.  Labwork:   none  Testing/Procedures:  Your physician has requested that you have an exercise tolerance test. For further information please visit [email or web address]. Please also follow instruction sheet, as given.  Follow-Up:  With Dr. Stanford Breed in 6-9 months  If you need a refill on your cardiac medications before your next appointment, please call your pharmacy.

## 2017-03-01 NOTE — Progress Notes (Signed)
Cardiology Office Note    Date:  03/02/2017   ID:  Andrew Burgess April 12, 1971, MRN [id number]  PCP:  Abner Greenspan, MD  Cardiologist:  Dr. Stanford Breed  Chief Complaint  Patient presents with  . Follow-up    seen for Dr. Stanford Breed    History of Present Illness:  Andrew Burgess is a 46 y.o. male with PMH of HTN, DM, HLD, sickle cell trait, and CAD. He had NSTEMI in August 2017, cardiac catheterization on 01/18/2016 revealed occluded RCA with heavy thrombus burden. He eventually was treated with DES along with thrombectomy as well as post procedure Aggrastat. Relook cath performed on 01/21/2016 showed significant thrombus in the proximal edge of the RCA stent. This was treated with 2 more DES. He has normal left coronary sugar reaches system including LAD, ramus and left circumflex without significant disease. Echocardiogram showed preserved LV function. He underwent ETT in October 2017 which was normal. He was last seen by Dr. Stanford Breed on 04/20/2016, at which time he denies any significant chest discomfort.  He has been doing quite well from last year. He is able to exercise by riding bicycle and do pushup without any exertional chest pain or shortness of breath. He did check with his company who require a stress test every year for the first years after the heart attack, I think eventually we can space out stress test to every 2 years. Otherwise, I will order a GXT. He does not have significant lower extremity edema, orthopnea or PND episode. Last lipid panel obtained in June 2018 does show that his triglyceride increased compared to December 2017. His triglycerides 224 in June. He is currently on high dose of Lipitor, fenofibrate, and Lovaza. I recommended increase exercise and also diet.   Past Medical History:  Diagnosis Date  . Allergy   . CAD (coronary artery disease)    a. 01/2016: Peak troponin 23.15. Left heart cath on 01/21/16 showed 100% occluded mid-dist RCA occlusion with heavy  thrombus burden s/p DES. Rx asp thrombectomy and post procedure Aggrastat. Re look 01/21/16 revealed persistent thrombus in prox aspect of stent as well as a distal lesion both of which were intervened on successfully with placement of 2 additional DES.  . Diabetes mellitus ([place])   . Hyperglycemia   . Hyperlipidemia    triglyceriedes   . Hypertriglyceridemia   . Obesity   . Sickle cell trait [facility])     Past Surgical History:  Procedure Laterality Date  . ANKLE SURGERY    . CARDIAC CATHETERIZATION N/A 01/18/2016   Procedure: Left Heart Cath and Coronary Angiography;  Surgeon: Jettie Booze, MD;  Location: [place] [facility];  Service: Cardiovascular;  Laterality: N/A;  . CARDIAC CATHETERIZATION N/A 01/18/2016   Procedure: Coronary Stent Intervention;  Surgeon: Jettie Booze, MD;  Location: [place] [facility];  Service: Cardiovascular;  Laterality: N/A;  MID RCA  . CARDIAC CATHETERIZATION N/A 01/21/2016   Procedure: Left Heart Cath and Coronary Angiography;  Surgeon: Troy Sine, MD;  Location: [place] [facility];  Service: Cardiovascular;  Laterality: N/A;  . CARDIAC CATHETERIZATION N/A 01/21/2016   Procedure: Coronary Stent Intervention;  Surgeon: Troy Sine, MD;  Location: [place] [facility];  Service: Cardiovascular;  Laterality: N/A;    Current Medications: Outpatient Medications Prior to Visit  Medication Sig Dispense Refill  . ASPIRIN PO Take 81 mg by mouth daily.     [name] atorvastatin (LIPITOR) 80 MG tablet TAKE 1 TABLET (  80 MG TOTAL) BY MOUTH DAILY AT 6 PM. 90 tablet 0  . BRILINTA 90 MG TABS tablet TAKE 1 TABLET (90 MG TOTAL) BY MOUTH 2 (TWO) TIMES DAILY. 180 tablet 1  . fenofibrate 160 MG tablet TAKE 1 TABLET BY MOUTH DAILY 30 tablet 5  . metFORMIN (GLUCOPHAGE) 500 MG tablet TAKE 1 TABLET BY MOUTH 2 TIMES DAILY WITH A MEAL 60 tablet 11  . metoprolol tartrate (LOPRESSOR) 25 MG tablet TAKE 1 TABLET (25 MG TOTAL) BY MOUTH 2 (TWO) TIMES DAILY. 60 tablet 6  . nitroGLYCERIN  (NITROSTAT) 0.4 MG SL tablet Place 0.4 mg under the tongue every 5 (five) minutes as needed for chest pain (3 DOSES MAX).    [name] omega-3 acid ethyl esters (LOVAZA) 1 g capsule TAKE 1 CAPSULE BY MOUTH 2 TIMES DAILY 60 capsule 11   No facility-administered medications prior to visit.      Allergies:   Patient has no known allergies.   Social History   Social History  . Marital status: Married    Spouse name: N/A  . Number of children: 2  . Years of education: N/A   Occupational History  . Truck driver    Social History Main Topics  . Smoking status: Former Smoker    Types: Cigars    Quit date: 01/18/2012  . Smokeless tobacco: Never Used  . Alcohol use 0.0 oz/week     Comment: rare  . Drug use: No  . Sexual activity: Yes   Other Topics Concern  . None   Social History Narrative   Some caffeine      Family History:  The patient's family history includes Alcohol abuse in his father; Cancer in his maternal grandmother; Coronary artery disease in his mother; Heart attack in his mother; Hyperlipidemia in his father and mother; Hypertension in his brother and father.   ROS:   Please see the history of present illness.    ROS All other systems reviewed and are negative.   PHYSICAL EXAM:   VS:  BP 120/80   Pulse 61   Ht 5\' 10"  (1.778 m)   Wt 244 lb 6.4 oz (110.9 kg)   BMI 35.07 kg/m    GEN: Well nourished, well developed, in no acute distress  HEENT: normal  Neck: no JVD, carotid bruits, or masses Cardiac: RRR; no murmurs, rubs, or gallops,no edema  Respiratory:  clear to auscultation bilaterally, normal work of breathing GI: soft, nontender, nondistended, + BS MS: no deformity or atrophy  Skin: warm and dry, no rash Neuro:  Alert and Oriented x 3, Strength and sensation are intact Psych: euthymic mood, full affect  Wt Readings from Last 3 Encounters:  03/01/17 244 lb 6.4 oz (110.9 kg)  11/23/16 242 lb 12 oz (110.1 kg)  05/22/16 241 lb 4 oz (109.4 kg)       Studies/Labs Reviewed:   EKG:  EKG is ordered today.  The ekg ordered today demonstrates Normal sinus rhythm, T-wave inversions in inferior leads, leads 3 and aVF.  Recent Labs: 11/16/2016: ALT 33; BUN 19; Creatinine, Ser 1.41; Hemoglobin 12.5; Platelets 252.0; Potassium 4.0; Sodium 139; TSH 2.44   Lipid Panel    Component Value Date/Time   CHOL 96 11/16/2016 0839   TRIG 224.0 (H) 11/16/2016 0839   HDL 22.80 (L) 11/16/2016 0839   CHOLHDL 4 11/16/2016 0839   VLDL 44.8 (H) 11/16/2016 0839   LDLCALC 46 05/22/2016 1051   LDLDIRECT 34.0 11/16/2016 0839    Additional studies/ records  that were reviewed today include:   Echo 01/18/2016 LV EF: 55% -   60%  ------------------------------------------------------------------- Indications:      Chest pain 786.51.  ------------------------------------------------------------------- History:   PMH:  No prior cardiac history.  ------------------------------------------------------------------- Study Conclusions  - Left ventricle: The cavity size was normal. Wall thickness was   normal. Systolic function was normal. The estimated ejection   fraction was in the range of 55% to 60%. There is akinesis of the   basalinferoseptal myocardium. Left ventricular diastolic function   parameters were normal. - Aortic valve: Trileaflet; mildly thickened, moderately calcified   leaflets. - Tricuspid valve: There was trivial regurgitation.   Cath 01/18/2016 Conclusion     Mid RCA to Dist RCA lesion, 100 %stenosed with heavy thrombus burden. A STENT SYNERGY DES 3.5X38 drug eluting stent was successfully placed.  LV end diastolic pressure is moderately elevated.  There is no aortic valve stenosis.  Mid RCA to Dist RCA lesion, 100 %stenosed.  Post intervention, there is a 10% residual stenosis.   Continue IV tirofiban for 18 hours. Restart heparin in 8 hours. I would continue the heparin until Monday. At that point, would have to  consider a relook catheterization to see if some of the thrombus has resolved, depending on how he is doing.  Start high-dose statin and low-dose beta blocker. IV nitroglycerin for pressure. He'll need dual antiplatelet therapy for at least a year.     Cath 01/21/2016 Conclusion   Normal left coronary circulation involving the LAD, ramus intermediate, and left circumflex vessel.  Large dominant RCA with a superior takeoff and with the previously placed 3.538 mm Synergy DES stent extending from the mid RCA to just proximal to the distal RCA bifurcation with significant residual thrombus in the proximal third of this stented segment and 90% stenosis at a site of previous thrombus at the bifurcation of the PDA and continuation branch.  Difficult but successful intervention with ultimate insertion of a new 3.012 mm Synergy DES stent at the distal RCA 90% bifurcation with the 90% stenosis being reduced to 0% no change in the 80% ostial PDA stenosis following stenting, and PTCA with insertion of a new 3.520 mm Synergy DES stent extending from the previously placed mid RCA proximally with complete resolution of prior significant residual thrombus burden.   Recommendation: The patient will continue with bivalirudin for 4 hours post procedure.  He will continue with DAPT for minimum of a year.    ETT 03/26/2016 Study Highlights     Blood pressure demonstrated a normal response to exercise.  There was no ST segment deviation noted during stress.   ETT with good exercise tolerance (9:36); no chest pain; normal BP response; no ST changes; negative adequate ETT; Duke treadmill score 10 (low risk).      ASSESSMENT:    1. Coronary artery disease involving native coronary artery of native heart without angina pectoris   2. Hyperlipidemia, unspecified hyperlipidemia type   3. Essential hypertension   4. Controlled type 2 diabetes mellitus without complication, without long-term current use  of insulin (HCC)      PLAN:  In order of problems listed above:  1. CAD: No obvious exertional chest discomfort. EKG continued to show T-wave inversion in inferior leads. I will repeat a GXT, although I think we can eventually was placed out GXT to every 2 years.  2. Hypertension: Blood pressure well controlled on metoprolol  3. DM 2: On metformin  4. Hyperlipidemia: On 80 mg Lipitor,  160 mg fenofibrate and 1000 mg twice a day of Lovaza, LDL well controlled, however triglyceride has been high, I recommended improve diet and exercise.    Medication Adjustments/Labs and Tests Ordered: Current medicines are reviewed at length with the patient today.  Concerns regarding medicines are outlined above.  Medication changes, Labs and Tests ordered today are listed in the Patient Instructions below. Patient Instructions  Medication Instructions:   No changes to current regimen.  Labwork:   none  Testing/Procedures:  Your physician has requested that you have an exercise tolerance test. For further information please visit [email or web address]. Please also follow instruction sheet, as given.  Follow-Up:  With Dr. Stanford Breed in 6-9 months  If you need a refill on your cardiac medications before your next appointment, please call your pharmacy.      Hilbert Corrigan, [place]  03/02/2017 8:15 AM    [address], [place], [place]  [address] Phone: [phone]; Fax: [phone]

## 2017-03-02 ENCOUNTER — Encounter: Payer: Self-pay | Admitting: Physician Assistant

## 2017-03-09 ENCOUNTER — Telehealth (HOSPITAL_COMMUNITY): Payer: Self-pay

## 2017-03-09 NOTE — Telephone Encounter (Signed)
encounter complete

## 2017-03-11 ENCOUNTER — Ambulatory Visit (HOSPITAL_COMMUNITY)
Admission: RE | Admit: 2017-03-11 | Discharge: 2017-03-11 | Disposition: A | Discharge status: Home / Self Care | Payer: 59 | Source: Ambulatory Visit | Attending: Cardiology | Admitting: Cardiology

## 2017-03-11 DIAGNOSIS — I2510 Atherosclerotic heart disease of native coronary artery without angina pectoris: Secondary | ICD-10-CM | POA: Diagnosis not present

## 2017-03-12 LAB — EXERCISE TOLERANCE TEST
CHL CUP MPHR: 172 {beats}/min
CSEPHR: 88 %
Estimated workload: 10.5 METS
Exercise duration (min): 9 min
Exercise duration (sec): 15 s
Peak HR: 153 {beats}/min
RPE: 18
Rest HR: 67 {beats}/min

## 2017-03-12 NOTE — Progress Notes (Signed)
Looks good, cleared for DOT

## 2017-03-15 ENCOUNTER — Encounter: Payer: Self-pay | Admitting: *Deleted

## 2017-03-15 ENCOUNTER — Other Ambulatory Visit: Payer: Self-pay | Admitting: Cardiology

## 2017-03-15 DIAGNOSIS — E291 Testicular hypofunction: Secondary | ICD-10-CM | POA: Diagnosis not present

## 2017-03-15 DIAGNOSIS — Z125 Encounter for screening for malignant neoplasm of prostate: Secondary | ICD-10-CM | POA: Diagnosis not present

## 2017-03-22 ENCOUNTER — Telehealth: Payer: Self-pay | Admitting: *Deleted

## 2017-03-22 NOTE — Telephone Encounter (Signed)
Pt came in for walk-in request for paperwork completion - has a return to work/DOT clearance form provided by [employer] (patient's employer) he wanted filled out.  Informed him I would put to Hao's attention (will be in office tomorrow). Also informed him I would be glad to call [employer] to see if they will accept a copy of the clearance letter (previously written and signed). He gave me number of Lucrezia Starch HR rep [phone]) to call. I called but was put through to her VM. I left direct line to call me back today.  Will call patient back if I hear back from employer office today, or when ppw signed/completed by [facility] tomorrow.

## 2017-04-11 ENCOUNTER — Other Ambulatory Visit: Payer: Self-pay | Admitting: Cardiology

## 2017-04-12 NOTE — Telephone Encounter (Signed)
REFILL 

## 2017-05-15 ENCOUNTER — Other Ambulatory Visit: Payer: Self-pay | Admitting: Physician Assistant

## 2017-05-18 NOTE — Telephone Encounter (Signed)
°*  STAT* If patient is at the pharmacy, call can be transferred to refill team.   1. Which medications need to be refilled? (please list name of each medication and dose if known) berlinta 90mg  2xday  2. Which pharmacy/location (including street and city if local pharmacy) is medication to be sent to? [facility] [address]   3. Do they need a 30 day or 90 day supply? [place]

## 2017-05-18 NOTE — Telephone Encounter (Signed)
Not anticoagulation

## 2017-05-24 ENCOUNTER — Encounter: Payer: 59 | Admitting: Family Medicine

## 2017-05-24 ENCOUNTER — Ambulatory Visit: Payer: 59 | Admitting: Family Medicine

## 2017-06-02 ENCOUNTER — Other Ambulatory Visit: Payer: Self-pay | Admitting: Family Medicine

## 2017-06-22 ENCOUNTER — Encounter: Payer: 59 | Admitting: Family Medicine

## 2017-07-12 ENCOUNTER — Other Ambulatory Visit (INDEPENDENT_AMBULATORY_CARE_PROVIDER_SITE_OTHER): Payer: 59

## 2017-07-12 ENCOUNTER — Encounter: Payer: Self-pay | Admitting: Family Medicine

## 2017-07-12 ENCOUNTER — Ambulatory Visit: Payer: 59 | Admitting: Family Medicine

## 2017-07-12 ENCOUNTER — Other Ambulatory Visit: Payer: 59

## 2017-07-12 ENCOUNTER — Telehealth: Payer: Self-pay | Admitting: Radiology

## 2017-07-12 ENCOUNTER — Encounter (HOSPITAL_COMMUNITY): Payer: Self-pay

## 2017-07-12 ENCOUNTER — Other Ambulatory Visit: Payer: Self-pay

## 2017-07-12 ENCOUNTER — Observation Stay (HOSPITAL_COMMUNITY)
Admission: EM | Admit: 2017-07-12 | Discharge: 2017-07-15 | Disposition: A | Discharge status: Home / Self Care | Payer: 59 | Attending: Internal Medicine | Admitting: Internal Medicine

## 2017-07-12 VITALS — BP 125/77 | HR 77 | Temp 98.4°F | Ht 70.0 in | Wt 232.8 lb

## 2017-07-12 DIAGNOSIS — I252 Old myocardial infarction: Secondary | ICD-10-CM | POA: Diagnosis not present

## 2017-07-12 DIAGNOSIS — E1165 Type 2 diabetes mellitus with hyperglycemia: Secondary | ICD-10-CM

## 2017-07-12 DIAGNOSIS — N182 Chronic kidney disease, stage 2 (mild): Secondary | ICD-10-CM | POA: Diagnosis not present

## 2017-07-12 DIAGNOSIS — I2510 Atherosclerotic heart disease of native coronary artery without angina pectoris: Secondary | ICD-10-CM | POA: Diagnosis present

## 2017-07-12 DIAGNOSIS — N179 Acute kidney failure, unspecified: Secondary | ICD-10-CM | POA: Diagnosis present

## 2017-07-12 DIAGNOSIS — E1121 Type 2 diabetes mellitus with diabetic nephropathy: Secondary | ICD-10-CM

## 2017-07-12 DIAGNOSIS — Z6833 Body mass index (BMI) 33.0-33.9, adult: Secondary | ICD-10-CM | POA: Diagnosis not present

## 2017-07-12 DIAGNOSIS — Z79899 Other long term (current) drug therapy: Secondary | ICD-10-CM | POA: Diagnosis not present

## 2017-07-12 DIAGNOSIS — Z811 Family history of alcohol abuse and dependence: Secondary | ICD-10-CM | POA: Insufficient documentation

## 2017-07-12 DIAGNOSIS — Z7984 Long term (current) use of oral hypoglycemic drugs: Secondary | ICD-10-CM | POA: Diagnosis not present

## 2017-07-12 DIAGNOSIS — Z9111 Patient's noncompliance with dietary regimen: Secondary | ICD-10-CM | POA: Diagnosis not present

## 2017-07-12 DIAGNOSIS — Z808 Family history of malignant neoplasm of other organs or systems: Secondary | ICD-10-CM | POA: Diagnosis not present

## 2017-07-12 DIAGNOSIS — I129 Hypertensive chronic kidney disease with stage 1 through stage 4 chronic kidney disease, or unspecified chronic kidney disease: Secondary | ICD-10-CM | POA: Insufficient documentation

## 2017-07-12 DIAGNOSIS — Z87891 Personal history of nicotine dependence: Secondary | ICD-10-CM | POA: Insufficient documentation

## 2017-07-12 DIAGNOSIS — E1100 Type 2 diabetes mellitus with hyperosmolarity without nonketotic hyperglycemic-hyperosmolar coma (NKHHC): Secondary | ICD-10-CM | POA: Diagnosis not present

## 2017-07-12 DIAGNOSIS — R7309 Other abnormal glucose: Secondary | ICD-10-CM

## 2017-07-12 DIAGNOSIS — IMO0002 Reserved for concepts with insufficient information to code with codable children: Secondary | ICD-10-CM

## 2017-07-12 DIAGNOSIS — E781 Pure hyperglyceridemia: Secondary | ICD-10-CM | POA: Diagnosis not present

## 2017-07-12 DIAGNOSIS — E785 Hyperlipidemia, unspecified: Secondary | ICD-10-CM | POA: Insufficient documentation

## 2017-07-12 DIAGNOSIS — E669 Obesity, unspecified: Secondary | ICD-10-CM | POA: Diagnosis not present

## 2017-07-12 DIAGNOSIS — D573 Sickle-cell trait: Secondary | ICD-10-CM | POA: Diagnosis not present

## 2017-07-12 DIAGNOSIS — R739 Hyperglycemia, unspecified: Secondary | ICD-10-CM | POA: Diagnosis not present

## 2017-07-12 DIAGNOSIS — E1122 Type 2 diabetes mellitus with diabetic chronic kidney disease: Secondary | ICD-10-CM | POA: Diagnosis not present

## 2017-07-12 DIAGNOSIS — E872 Acidosis: Secondary | ICD-10-CM | POA: Diagnosis not present

## 2017-07-12 DIAGNOSIS — Z955 Presence of coronary angioplasty implant and graft: Secondary | ICD-10-CM | POA: Diagnosis not present

## 2017-07-12 DIAGNOSIS — Z7982 Long term (current) use of aspirin: Secondary | ICD-10-CM | POA: Diagnosis not present

## 2017-07-12 DIAGNOSIS — Z8249 Family history of ischemic heart disease and other diseases of the circulatory system: Secondary | ICD-10-CM | POA: Diagnosis not present

## 2017-07-12 DIAGNOSIS — E118 Type 2 diabetes mellitus with unspecified complications: Principal | ICD-10-CM

## 2017-07-12 DIAGNOSIS — E1101 Type 2 diabetes mellitus with hyperosmolarity with coma: Secondary | ICD-10-CM | POA: Diagnosis not present

## 2017-07-12 DIAGNOSIS — E119 Type 2 diabetes mellitus without complications: Secondary | ICD-10-CM | POA: Diagnosis not present

## 2017-07-12 DIAGNOSIS — E11319 Type 2 diabetes mellitus with unspecified diabetic retinopathy without macular edema: Secondary | ICD-10-CM

## 2017-07-12 DIAGNOSIS — Z9861 Coronary angioplasty status: Secondary | ICD-10-CM

## 2017-07-12 DIAGNOSIS — E860 Dehydration: Secondary | ICD-10-CM | POA: Diagnosis not present

## 2017-07-12 DIAGNOSIS — I2119 ST elevation (STEMI) myocardial infarction involving other coronary artery of inferior wall: Secondary | ICD-10-CM | POA: Diagnosis not present

## 2017-07-12 LAB — BASIC METABOLIC PANEL
ANION GAP: 14 (ref 5–15)
BUN: 25 mg/dL — ABNORMAL HIGH (ref 6–23)
BUN: 25 mg/dL — ABNORMAL HIGH (ref 6–23)
BUN: 27 mg/dL — ABNORMAL HIGH (ref 6–20)
CHLORIDE: 95 meq/L — AB (ref 96–112)
CHLORIDE: 95 meq/L — AB (ref 96–112)
CHLORIDE: 96 mmol/L — AB (ref 101–111)
CO2: 22 meq/L (ref 19–32)
CO2: 22 mEq/L (ref 19–32)
CO2: 18 mmol/L — ABNORMAL LOW (ref 22–32)
Calcium: 9.2 mg/dL (ref 8.4–10.5)
Calcium: 9.2 mg/dL (ref 8.4–10.5)
Calcium: 9 mg/dL (ref 8.9–10.3)
Creatinine, Ser: 1.72 mg/dL — ABNORMAL HIGH (ref 0.40–1.50)
Creatinine, Ser: 1.72 mg/dL — ABNORMAL HIGH (ref 0.40–1.50)
Creatinine, Ser: 2.2 mg/dL — ABNORMAL HIGH (ref 0.61–1.24)
GFR, EST AFRICAN AMERICAN: 39 mL/min — AB (ref >60)
GFR, EST NON AFRICAN AMERICAN: 34 mL/min — AB (ref >60)
GFR: 54.75 mL/min — ABNORMAL LOW (ref >60.00)
GFR: 54.75 mL/min — AB (ref >60.00)
Glucose, Bld: 701 mg/dL (ref 70–99)
Glucose, Bld: 701 mg/dL (ref 70–99)
Glucose, Bld: 733 mg/dL (ref 65–99)
POTASSIUM: 4.4 meq/L (ref 3.5–5.1)
POTASSIUM: 4.4 meq/L (ref 3.5–5.1)
POTASSIUM: 4.8 mmol/L (ref 3.5–5.1)
SODIUM: 127 meq/L — AB (ref 135–145)
SODIUM: 128 mmol/L — AB (ref 135–145)
Sodium: 127 mEq/L — ABNORMAL LOW (ref 135–145)

## 2017-07-12 LAB — CBC WITH DIFFERENTIAL/PLATELET
BASOS ABS: 0 10*3/uL (ref 0.0–0.1)
Basophils Relative: 0.5 % (ref 0.0–3.0)
EOS ABS: 0.1 10*3/uL (ref 0.0–0.7)
Eosinophils Relative: 1.1 % (ref 0.0–5.0)
HCT: 39.7 % (ref 39.0–52.0)
HEMOGLOBIN: 13.6 g/dL (ref 13.0–17.0)
Lymphocytes Relative: 30.8 % (ref 12.0–46.0)
Lymphs Abs: 2.2 10*3/uL (ref 0.7–4.0)
MCHC: 34.2 g/dL (ref 30.0–36.0)
MCV: 85.4 fl (ref 78.0–100.0)
MONO ABS: 0.4 10*3/uL (ref 0.1–1.0)
Monocytes Relative: 5.9 % (ref 3.0–12.0)
Neutro Abs: 4.5 10*3/uL (ref 1.4–7.7)
Neutrophils Relative %: 61.7 % (ref 43.0–77.0)
Platelets: 216 10*3/uL (ref 150.0–400.0)
RBC: 4.65 Mil/uL (ref 4.22–5.81)
RDW: 13.4 % (ref 11.5–15.5)
WBC: 7.3 10*3/uL (ref 4.0–10.5)

## 2017-07-12 LAB — I-STAT VENOUS BLOOD GAS, ED
ACID-BASE DEFICIT: 4 mmol/L — AB (ref 0.0–2.0)
BICARBONATE: 21.5 mmol/L (ref 20.0–28.0)
O2 SAT: 83 %
TCO2: 23 mmol/L (ref 22–32)
pCO2, Ven: 39.4 mmHg — ABNORMAL LOW (ref 44.0–60.0)
pH, Ven: 7.346 (ref 7.250–7.430)
pO2, Ven: 50 mmHg — ABNORMAL HIGH (ref 32.0–45.0)

## 2017-07-12 LAB — URINALYSIS, ROUTINE W REFLEX MICROSCOPIC
BILIRUBIN URINE: NEGATIVE
Bacteria, UA: NONE SEEN
HGB URINE DIPSTICK: NEGATIVE
KETONES UR: NEGATIVE mg/dL
LEUKOCYTES UA: NEGATIVE
NITRITE: NEGATIVE
PH: 6 (ref 5.0–8.0)
Protein, ur: NEGATIVE mg/dL
RBC / HPF: NONE SEEN RBC/hpf (ref 0–5)
SPECIFIC GRAVITY, URINE: 1.022 (ref 1.005–1.030)
Squamous Epithelial / LPF: NONE SEEN

## 2017-07-12 LAB — HEPATIC FUNCTION PANEL
ALT: 42 U/L (ref 0–53)
AST: 46 U/L — ABNORMAL HIGH (ref 0–37)
Albumin: 4.4 g/dL (ref 3.5–5.2)
Alkaline Phosphatase: 70 U/L (ref 39–117)
BILIRUBIN TOTAL: 0.5 mg/dL (ref 0.2–1.2)
Bilirubin, Direct: 0.2 mg/dL (ref 0.0–0.3)
Total Protein: 7.5 g/dL (ref 6.0–8.3)

## 2017-07-12 LAB — CBC
HEMATOCRIT: 37 % — AB (ref 39.0–52.0)
Hemoglobin: 12.8 g/dL — ABNORMAL LOW (ref 13.0–17.0)
MCH: 29 pg (ref 26.0–34.0)
MCHC: 34.6 g/dL (ref 30.0–36.0)
MCV: 83.7 fL (ref 78.0–100.0)
Platelets: 224 10*3/uL (ref 150–400)
RBC: 4.42 MIL/uL (ref 4.22–5.81)
RDW: 12.7 % (ref 11.5–15.5)
WBC: 7.9 10*3/uL (ref 4.0–10.5)

## 2017-07-12 LAB — CBG MONITORING, ED
GLUCOSE-CAPILLARY: 382 mg/dL — AB (ref 65–99)
Glucose-Capillary: >600 mg/dL (ref 65–99)
Glucose-Capillary: 454 mg/dL — ABNORMAL HIGH (ref 65–99)

## 2017-07-12 LAB — GLUCOSE, POCT (MANUAL RESULT ENTRY)

## 2017-07-12 MED ORDER — ONDANSETRON HCL 4 MG/2ML IJ SOLN: 4.0000 mg | Freq: Four times a day (QID) | INTRAMUSCULAR | Status: DC | PRN

## 2017-07-12 MED ORDER — ATORVASTATIN CALCIUM 80 MG PO TABS
80.0000 mg | ORAL_TABLET | Freq: Every day | ORAL | Status: DC
  Administered 2017-07-13 – 2017-07-14 (×2): 80 mg via ORAL
  Filled 2017-07-12 (×2): qty 1

## 2017-07-12 MED ORDER — SODIUM CHLORIDE 0.9 % IV BOLUS (SEPSIS)
2000.0000 mL | Freq: Once | INTRAVENOUS | Status: AC
  Administered 2017-07-12: 2000 mL via INTRAVENOUS

## 2017-07-12 MED ORDER — DEXTROSE-NACL 5-0.45 % IV SOLN: INTRAVENOUS | Status: DC

## 2017-07-12 MED ORDER — NITROGLYCERIN 0.4 MG SL SUBL: 0.4000 mg | SUBLINGUAL_TABLET | SUBLINGUAL | Status: DC | PRN

## 2017-07-12 MED ORDER — SODIUM CHLORIDE 0.9 % IV SOLN
INTRAVENOUS | Status: DC
  Administered 2017-07-12: 5.4 [IU]/h via INTRAVENOUS
  Filled 2017-07-12: qty 1

## 2017-07-12 MED ORDER — METOPROLOL TARTRATE 25 MG PO TABS
25.0000 mg | ORAL_TABLET | Freq: Two times a day (BID) | ORAL | Status: DC
  Administered 2017-07-13 – 2017-07-15 (×5): 25 mg via ORAL
  Filled 2017-07-12 (×5): qty 1

## 2017-07-12 MED ORDER — ONDANSETRON HCL 4 MG PO TABS: 4.0000 mg | ORAL_TABLET | Freq: Four times a day (QID) | ORAL | Status: DC | PRN

## 2017-07-12 MED ORDER — ASPIRIN 81 MG PO CHEW
81.0000 mg | CHEWABLE_TABLET | Freq: Every day | ORAL | Status: DC
  Administered 2017-07-12 – 2017-07-14 (×3): 81 mg via ORAL
  Filled 2017-07-12 (×3): qty 1

## 2017-07-12 MED ORDER — FENOFIBRATE 160 MG PO TABS
160.0000 mg | ORAL_TABLET | Freq: Every day | ORAL | Status: DC
  Administered 2017-07-12 – 2017-07-14 (×3): 160 mg via ORAL
  Filled 2017-07-12 (×3): qty 1

## 2017-07-12 MED ORDER — SODIUM CHLORIDE 0.9 % IV SOLN
INTRAVENOUS | Status: DC
  Filled 2017-07-12: qty 1

## 2017-07-12 MED ORDER — TICAGRELOR 90 MG PO TABS
90.0000 mg | ORAL_TABLET | Freq: Two times a day (BID) | ORAL | Status: DC
  Administered 2017-07-12 – 2017-07-15 (×6): 90 mg via ORAL
  Filled 2017-07-12 (×6): qty 1

## 2017-07-12 MED ORDER — METFORMIN HCL 500 MG PO TABS: 1000.0000 mg | ORAL_TABLET | Freq: Two times a day (BID) | ORAL | 3 refills | Status: DC

## 2017-07-12 MED ORDER — SODIUM CHLORIDE 0.9 % IV SOLN
INTRAVENOUS | Status: DC
  Administered 2017-07-12 – 2017-07-14 (×5): via INTRAVENOUS

## 2017-07-12 MED ORDER — DEXTROSE 50 % IV SOLN: 25.0000 mL | INTRAVENOUS | Status: DC | PRN

## 2017-07-12 MED ORDER — INSULIN REGULAR BOLUS VIA INFUSION
0.0000 [IU] | Freq: Three times a day (TID) | INTRAVENOUS | Status: DC
  Filled 2017-07-12: qty 10

## 2017-07-12 MED ORDER — NITROGLYCERIN 0.4 MG SL SUBL: 0.4000 mg | SUBLINGUAL_TABLET | SUBLINGUAL | 2 refills | Status: DC | PRN

## 2017-07-12 NOTE — Telephone Encounter (Signed)
Results are in, Dr Lorelei Pont notified

## 2017-07-12 NOTE — H&P (Signed)
History and Physical    Andrew Burgess JSE:[id number] DOB: [birth year] DOA: 07/12/2017  PCP: Abner Greenspan, MD  Patient coming from: Home  Chief Complaint: Dizziness not feeling well  HPI: Andrew Burgess is a 47 y.o. male with medical history significant of diabetes, recent heart attack the end of last year of status post stent in August 2017 comes in with several weeks of not feeling well.  He has been having polydipsia and polyuria.  He is had about a 13 pound weight loss.  He is been getting dizzy and weak.  Patient went to see his primary care physician and his glucose was noted to be very elevated.  He was sent to the emergency department for the institution of insulin.  He is only been on metformin at home and has been compliant with this.  He denies any fevers.  He denies any nausea or vomiting.  Patient is being referred for a sugar of over 700.  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  Review of Systems: As per HPI otherwise 10 point review of systems negative.   Past Medical History:  Diagnosis Date  . Allergy   . CAD (coronary artery disease)    a. 01/2016: Peak troponin 23.15. Left heart cath on 01/21/16 showed 100% occluded mid-dist RCA occlusion with heavy thrombus burden s/p DES. Rx asp thrombectomy and post procedure Aggrastat. Re look 01/21/16 revealed persistent thrombus in prox aspect of stent as well as a distal lesion both of which were intervened on successfully with placement of 2 additional DES.  . Diabetes mellitus ([place])   . Hyperglycemia   . Hyperlipidemia    triglyceriedes   . Hypertriglyceridemia   . Obesity   . Sickle cell trait [facility])     Past Surgical History:  Procedure Laterality Date  . ANKLE SURGERY    . CARDIAC CATHETERIZATION N/A 01/18/2016   Procedure: Left Heart Cath and Coronary Angiography;  Surgeon: Jettie Booze, MD;  Location: [place] [facility];  Service: Cardiovascular;  Laterality: N/A;  . CARDIAC  CATHETERIZATION N/A 01/18/2016   Procedure: Coronary Stent Intervention;  Surgeon: Jettie Booze, MD;  Location: [place] [facility];  Service: Cardiovascular;  Laterality: N/A;  MID RCA  . CARDIAC CATHETERIZATION N/A 01/21/2016   Procedure: Left Heart Cath and Coronary Angiography;  Surgeon: Troy Sine, MD;  Location: [place] [facility];  Service: Cardiovascular;  Laterality: N/A;  . CARDIAC CATHETERIZATION N/A 01/21/2016   Procedure: Coronary Stent Intervention;  Surgeon: Troy Sine, MD;  Location: [place] [facility];  Service: Cardiovascular;  Laterality: N/A;     reports that he quit smoking about 5 years ago. His smoking use included cigars. he has never used smokeless tobacco. He reports that he drinks alcohol. He reports that he does not use drugs.  No Known Allergies  Family History  Problem Relation Age of Onset  . Coronary artery disease Mother   . Heart attack Mother   . Hyperlipidemia Mother   . Alcohol abuse Father   . Hyperlipidemia Father   . Hypertension Father   . Hypertension Brother   . Cancer Maternal Grandmother        brain    Prior to Admission medications   Medication Sig Start Date End Date Taking? Authorizing Provider  ASPIRIN PO Take 81 mg by mouth daily.    Yes [provider]  atorvastatin (LIPITOR) 80 MG tablet TAKE 1  TABLET (80 MG TOTAL) BY MOUTH DAILY AT 6 PM. 04/12/17  Yes Crenshaw, Denice Bors, MD  BRILINTA 90 MG TABS tablet TAKE 1 TABLET (90 MG TOTAL) BY MOUTH 2 (TWO) TIMES DAILY. 05/18/17  Yes Lelon Perla, MD  fenofibrate 160 MG tablet TAKE 1 TABLET BY MOUTH DAILY 06/02/17  Yes Tower, Wynelle Fanny, MD  metFORMIN (GLUCOPHAGE) 500 MG tablet Take 2 tablets (1,000 mg total) by mouth 2 (two) times daily with a meal. 07/12/17  Yes Copland, Frederico Hamman, MD  metoprolol tartrate (LOPRESSOR) 25 MG tablet TAKE 1 TABLET BY MOUTH 2 TIMES DAILY 03/15/17  Yes Crenshaw, Denice Bors, MD  nitroGLYCERIN (NITROSTAT) 0.4 MG SL tablet Place 1 tablet (0.4 mg total)  under the tongue every 5 (five) minutes as needed for chest pain (3 DOSES MAX). 07/12/17  Yes Copland, Frederico Hamman, MD  omega-3 acid ethyl esters (LOVAZA) 1 g capsule TAKE 1 CAPSULE BY MOUTH 2 TIMES DAILY 11/23/16  Yes Tower, Wynelle Fanny, MD    Physical Exam: Vitals:   07/12/17 1702 07/12/17 1935 07/12/17 2108  BP: 123/77 107/80 118/71  Pulse: 66  71  Resp: 16  17  Temp: 98.4 F (36.9 C)    TempSrc: Oral    SpO2: 98%  99%      Constitutional: NAD, calm, comfortable Vitals:   07/12/17 1702 07/12/17 1935 07/12/17 2108  BP: 123/77 107/80 118/71  Pulse: 66  71  Resp: 16  17  Temp: 98.4 F (36.9 C)    TempSrc: Oral    SpO2: 98%  99%   Eyes: PERRL, lids and conjunctivae normal ENMT: Mucous membranes are moist. Posterior pharynx clear of any exudate or lesions.Normal dentition.  Neck: normal, supple, no masses, no thyromegaly Respiratory: clear to auscultation bilaterally, no wheezing, no crackles. Normal respiratory effort. No accessory muscle use.  Cardiovascular: Regular rate and rhythm, no murmurs / rubs / gallops. No extremity edema. 2+ pedal pulses. No carotid bruits.  Abdomen: no tenderness, no masses palpated. No hepatosplenomegaly. Bowel sounds positive.  Musculoskeletal: no clubbing / cyanosis. No joint deformity upper and lower extremities. Good ROM, no contractures. Normal muscle tone.  Skin: no rashes, lesions, ulcers. No induration Neurologic: CN 2-12 grossly intact. Sensation intact, DTR normal. Strength 5/5 in all 4.  Psychiatric: Normal judgment and insight. Alert and oriented x 3. Normal mood.    Labs on Admission: I have personally reviewed following labs and imaging studies  CBC: Recent Labs  Lab 07/12/17 1252 07/12/17 1729  WBC 7.3 7.9  NEUTROABS 4.5  --   HGB 13.6 12.8*  HCT 39.7 37.0*  MCV 85.4 83.7  PLT 216.0 371   Basic Metabolic Panel: Recent Labs  Lab 07/12/17 1518 07/12/17 1729  NA 127* 128*  K 4.4 4.8  CL 95* 96*  CO2 22 18*  GLUCOSE 701*  733*  BUN 25* 27*  CREATININE 1.72* 2.20*  CALCIUM 9.2 9.0   GFR: Estimated Creatinine Clearance: 49.9 mL/min (A) (by C-G formula based on SCr of 2.2 mg/dL (H)). Liver Function Tests: Recent Labs  Lab 07/12/17 1252  AST 46*  ALT 42  ALKPHOS 70  BILITOT 0.5  PROT 7.5  ALBUMIN 4.4   No results for input(s): LIPASE, AMYLASE in the last 168 hours. No results for input(s): AMMONIA in the last 168 hours. Coagulation Profile: No results for input(s): INR, PROTIME in the last 168 hours. Cardiac Enzymes: No results for input(s): CKTOTAL, CKMB, CKMBINDEX, TROPONINI in the last 168 hours. BNP (last 3 results) No results  for input(s): PROBNP in the last 8760 hours. HbA1C: No results for input(s): HGBA1C in the last 72 hours. CBG: Recent Labs  Lab 07/12/17 1709 07/12/17 2104  GLUCAP >600* 454*   Lipid Profile: No results for input(s): CHOL, HDL, LDLCALC, TRIG, CHOLHDL, LDLDIRECT in the last 72 hours. Thyroid Function Tests: No results for input(s): TSH, T4TOTAL, FREET4, T3FREE, THYROIDAB in the last 72 hours. Anemia Panel: No results for input(s): VITAMINB12, FOLATE, FERRITIN, TIBC, IRON, RETICCTPCT in the last 72 hours. Urine analysis:    Component Value Date/Time   COLORURINE COLORLESS (A) 07/12/2017 1722   APPEARANCEUR CLEAR 07/12/2017 1722   LABSPEC 1.022 07/12/2017 1722   PHURINE 6.0 07/12/2017 1722   GLUCOSEU >=500 (A) 07/12/2017 1722   HGBUR NEGATIVE 07/12/2017 1722   BILIRUBINUR NEGATIVE 07/12/2017 1722   KETONESUR NEGATIVE 07/12/2017 1722   PROTEINUR NEGATIVE 07/12/2017 1722   NITRITE NEGATIVE 07/12/2017 1722   LEUKOCYTESUR NEGATIVE 07/12/2017 1722   Sepsis Labs: !!!!!!!!!!!!!!!!!!!!!!!!!!!!!!!!!!!!!!!!!!!! @LABRCNTIP (procalcitonin:4,lacticidven:4) )No results found for this or any previous visit (from the past 240 hour(s)).   Radiological Exams on Admission: No results found.   Old chart reviewed Case discussed with EDP  Assessment/Plan 47 year old  male with hyper glycemic hyperosmolar nonketotic state from uncontrolled diabetes Principal Problem:   Hyperosmolar non-ketotic state in patient with type 2 diabetes mellitus (HCC)-insulin drip.  Check hemoglobin A1c.  Patient not acidotic.  He has been prepared that he will likely need insulin at discharge and for an uncertain amount of time.  Active Problems:   Status post coronary artery stent placement-continue his Brilinta and aspirin   CAD (coronary artery disease)-noted status post cath in August 2018   Diabetes mellitus (HCC)-holding metformin   AKI (acute kidney injury) (HCC)-baseline creatinine is around 1.4 currently 2.2 from dehydration and hyperosmolar state-IV fluids    DVT prophylaxis: Ambulate SCDs Code Status: Full Family Communication: None Disposition Plan: Per day team Consults called: None Admission status: Admission   Samauri Kellenberger A MD [facility]  If 7PM-7AM, please contact night-coverage [email or web address] Password [facility]  07/12/2017, 9:43 PM

## 2017-07-12 NOTE — Telephone Encounter (Signed)
BMP is not available.

## 2017-07-12 NOTE — Telephone Encounter (Signed)
[facility] lab called a critical glucose result, glucose 701.[name] Results given to Dr Lorelei Pont

## 2017-07-12 NOTE — ED Provider Notes (Signed)
[facility] EMERGENCY DEPARTMENT Provider Note   CSN: [id number] Arrival date & time: 07/12/17  1635     History   Chief Complaint Chief Complaint  Patient presents with  . Hyperglycemia    HPI Andrew Burgess is a 47 y.o. male.  He presents for evaluation of gradually worse dizziness, accompanied by polyuria, polydipsia, and polyphagia.  He has "prediabetes,", treated with metformin.  He states that he has never been diagnosed with diabetes.  He last saw his PCP 1 month ago and states his sugars have been doing "okay."  He denies documented fever, productive cough, persistent chest, back or abdominal pain.  There are no other known modifying factors.    HPI  Past Medical History:  Diagnosis Date  . Allergy   . CAD (coronary artery disease)    a. 01/2016: Peak troponin 23.15. Left heart cath on 01/21/16 showed 100% occluded mid-dist RCA occlusion with heavy thrombus burden s/p DES. Rx asp thrombectomy and post procedure Aggrastat. Re look 01/21/16 revealed persistent thrombus in prox aspect of stent as well as a distal lesion both of which were intervened on successfully with placement of 2 additional DES.  . Diabetes mellitus ([place])   . Hyperglycemia   . Hyperlipidemia    triglyceriedes   . Hypertriglyceridemia   . Obesity   . Sickle cell trait [facility])     Patient Active Problem List   Diagnosis Date Noted  . Hyperosmolar non-ketotic state in patient with type 2 diabetes mellitus ([place]) 07/12/2017  . Routine general medical examination at a health care facility 11/08/2016  . Low testosterone in male 06/02/2016  . Low libido 05/22/2016  . CAD (coronary artery disease)   . Hyperlipidemia   . Diabetes mellitus ([place])   . Status post coronary artery stent placement   . Acute MI inferior lateral first episode care ([place]) 01/18/2016  . Muscle weakness (generalized) 08/29/2012  . Stress reaction 01/19/2011  . Screen for STD (sexually transmitted disease)  01/12/2011  . Obesity 11/14/2007  . ALLERGIC RHINITIS 11/11/2007  . Hypertriglyceridemia 03/04/2007    Past Surgical History:  Procedure Laterality Date  . ANKLE SURGERY    . CARDIAC CATHETERIZATION N/A 01/18/2016   Procedure: Left Heart Cath and Coronary Angiography;  Surgeon: Jettie Booze, MD;  Location: [place] CV LAB;  Service: Cardiovascular;  Laterality: N/A;  . CARDIAC CATHETERIZATION N/A 01/18/2016   Procedure: Coronary Stent Intervention;  Surgeon: Jettie Booze, MD;  Location: [place] CV LAB;  Service: Cardiovascular;  Laterality: N/A;  MID RCA  . CARDIAC CATHETERIZATION N/A 01/21/2016   Procedure: Left Heart Cath and Coronary Angiography;  Surgeon: Troy Sine, MD;  Location: [place] CV LAB;  Service: Cardiovascular;  Laterality: N/A;  . CARDIAC CATHETERIZATION N/A 01/21/2016   Procedure: Coronary Stent Intervention;  Surgeon: Troy Sine, MD;  Location: [place] CV LAB;  Service: Cardiovascular;  Laterality: N/A;       Home Medications    Prior to Admission medications   Medication Sig Start Date End Date Taking? Authorizing Provider  ASPIRIN PO Take 81 mg by mouth daily.     [provider]  atorvastatin (LIPITOR) 80 MG tablet TAKE 1 TABLET (80 MG TOTAL) BY MOUTH DAILY AT 6 PM. 04/12/17   Crenshaw, Denice Bors, MD  BRILINTA 90 MG TABS tablet TAKE 1 TABLET (90 MG TOTAL) BY MOUTH 2 (TWO) TIMES DAILY. 05/18/17   Lelon Perla, MD  fenofibrate 160 MG tablet TAKE  1 TABLET BY MOUTH DAILY 06/02/17   Tower, Wynelle Fanny, MD  metFORMIN (GLUCOPHAGE) 500 MG tablet Take 2 tablets (1,000 mg total) by mouth 2 (two) times daily with a meal. 07/12/17   Copland, Frederico Hamman, MD  metoprolol tartrate (LOPRESSOR) 25 MG tablet TAKE 1 TABLET BY MOUTH 2 TIMES DAILY 03/15/17   Lelon Perla, MD  nitroGLYCERIN (NITROSTAT) 0.4 MG SL tablet Place 1 tablet (0.4 mg total) under the tongue every 5 (five) minutes as needed for chest pain (3 DOSES MAX). 07/12/17   Copland,  Frederico Hamman, MD  omega-3 acid ethyl esters (LOVAZA) 1 g capsule TAKE 1 CAPSULE BY MOUTH 2 TIMES DAILY 11/23/16   Tower, Wynelle Fanny, MD    Family History Family History  Problem Relation Age of Onset  . Coronary artery disease Mother   . Heart attack Mother   . Hyperlipidemia Mother   . Alcohol abuse Father   . Hyperlipidemia Father   . Hypertension Father   . Hypertension Brother   . Cancer Maternal Grandmother        brain    Social History Social History   Tobacco Use  . Smoking status: Former Smoker    Types: Cigars    Last attempt to quit: 01/18/2012    Years since quitting: 5.4  . Smokeless tobacco: Never Used  Substance Use Topics  . Alcohol use: Yes    Alcohol/week: 0.0 oz    Comment: rare  . Drug use: No     Allergies   Patient has no known allergies.   Review of Systems Review of Systems  All other systems reviewed and are negative.    Physical Exam Updated Vital Signs BP 107/80   Pulse 66   Temp 98.4 F (36.9 C) (Oral)   Resp 16   SpO2 98%   Physical Exam  Constitutional: He is oriented to person, place, and time. He appears well-developed and well-nourished. He appears distressed (He is uncomfortable).  HENT:  Head: Normocephalic and atraumatic.  Right Ear: External ear normal.  Left Ear: External ear normal.  Dry oral mucous membranes  Eyes: Conjunctivae and EOM are normal. Pupils are equal, round, and reactive to light.  Neck: Normal range of motion and phonation normal. Neck supple.  Cardiovascular: Normal rate, regular rhythm and normal heart sounds.  Pulmonary/Chest: Effort normal and breath sounds normal. He exhibits no bony tenderness.  Abdominal: Soft. There is no tenderness.  Musculoskeletal: Normal range of motion. He exhibits no tenderness or deformity.  Neurological: He is alert and oriented to person, place, and time. No cranial nerve deficit or sensory deficit. He exhibits normal muscle tone. Coordination normal.  Skin: Skin is warm,  dry and intact.  Psychiatric: He has a normal mood and affect. His behavior is normal. Judgment and thought content normal.  Nursing note and vitals reviewed.    ED Treatments / Results  Labs (all labs ordered are listed, but only abnormal results are displayed) Labs Reviewed  BASIC METABOLIC PANEL - Abnormal; Notable for the following components:      Result Value   Sodium 128 (*)    Chloride 96 (*)    CO2 18 (*)    Glucose, Bld 733 (*)    BUN 27 (*)    Creatinine, Ser 2.20 (*)    GFR calc non Af Amer 34 (*)    GFR calc Af Amer 39 (*)    All other components within normal limits  CBC - Abnormal; Notable for the following  components:   Hemoglobin 12.8 (*)    HCT 37.0 (*)    All other components within normal limits  URINALYSIS, ROUTINE W REFLEX MICROSCOPIC - Abnormal; Notable for the following components:   Color, Urine COLORLESS (*)    Glucose, UA >=500 (*)    All other components within normal limits  CBG MONITORING, ED - Abnormal; Notable for the following components:   Glucose-Capillary >600 (*)    All other components within normal limits  I-STAT VENOUS BLOOD GAS, ED - Abnormal; Notable for the following components:   pCO2, Ven 39.4 (*)    pO2, Ven 50.0 (*)    Acid-base deficit 4.0 (*)    All other components within normal limits    BUN  Date Value Ref Range Status  07/12/2017 27 (H) 6 - 20 mg/dL Final  07/12/2017 25 (H) 6 - 23 mg/dL Final  11/16/2016 19 6 - 23 mg/dL Final  05/22/2016 17 6 - 23 mg/dL Final   Creat  Date Value Ref Range Status  01/31/2016 1.53 (H) 0.60 - 1.35 mg/dL Final   Creatinine, Ser  Date Value Ref Range Status  07/12/2017 2.20 (H) 0.61 - 1.24 mg/dL Final  07/12/2017 1.72 (H) 0.40 - 1.50 mg/dL Final  11/16/2016 1.41 0.40 - 1.50 mg/dL Final  05/22/2016 1.46 0.40 - 1.50 mg/dL Final    EKG  EKG Interpretation None       Radiology No results found.  Procedures .Critical Care Performed by: Daleen Bo, MD Authorized by:  Daleen Bo, MD   Critical care provider statement:    Critical care time (minutes):  40   Critical care start time:  07/12/2017 7:15 PM   Critical care end time:  07/12/2017 8:13 PM   Critical care was necessary to treat or prevent imminent or life-threatening deterioration of the following conditions:  Endocrine crisis   Critical care was time spent personally by me on the following activities:  Blood draw for specimens, development of treatment plan with patient or surrogate, discussions with primary provider, obtaining history from patient or surrogate, ordering and performing treatments and interventions, ordering and review of laboratory studies, re-evaluation of patient's condition and review of old charts    (including critical care time)  Medications Ordered in ED Medications  dextrose 5 %-0.45 % sodium chloride infusion ( Intravenous Hold 07/12/17 2010)  insulin regular (NOVOLIN R,HUMULIN R) 100 Units in sodium chloride 0.9 % 100 mL (1 Units/mL) infusion (5.4 Units/hr Intravenous New Bag/Given 07/12/17 2001)  sodium chloride 0.9 % bolus 2,000 mL (2,000 mLs Intravenous New Bag/Given 07/12/17 2005)     Initial Impression / Assessment and Plan / ED Course  I have reviewed the triage vital signs and the nursing notes.  Pertinent labs & imaging results that were available during my care of the patient were reviewed by me and considered in my medical decision making (see chart for details).      Patient Vitals for the past 24 hrs:  BP Temp Temp src Pulse Resp SpO2  07/12/17 1935 107/80 - - - - -  07/12/17 1702 123/77 98.4 F (36.9 C) Oral 66 16 98 %      8:17 PM-Consult complete with hospitalist. Patient case explained and discussed.  She agrees to admit patient for further evaluation and treatment. Call ended at 109: 84  Final Clinical Impressions(s) / ED Diagnoses   Final diagnoses:  Hyperglycemia  AKI (acute kidney injury) ([place])  Dehydration   Diabetes  associated  with significant volume  depletion.  Venous blood gas has pH normal, 7.3, with PCO2 39 decreased consistent with compensated metabolic acidosis.  Patient previously has diagnosis of diabetes, which has been treated with metformin.  Labs remarkable for decreased sodium and potassium consistent with dehydration.  CO2 low, 18 with normal anion gap of 14.  Glucose elevated 733.  Creatinine elevated 2.2, is higher than baseline, 1.4, with azotemia, indicating volume depletion.  Urinalysis normal except elevated glucose.  Doubt UTI, or impending vascular collapse.  The potassium is normal.  He will require admission for treatment and stabilization  Nursing Notes Reviewed/ Care Coordinated Applicable Imaging Reviewed Interpretation of Laboratory Data incorporated into ED treatment   Plan: [place] [facility] ED Discharge Orders    None       Daleen Bo, MD 07/12/17 2104

## 2017-07-12 NOTE — Progress Notes (Addendum)
Dr. Frederico Hamman T. Aaliyana Fredericks, MD, [facility] and [facility] Medicine [place], [address] Phone: [phone] Fax: [phone]  07/12/2017  Patient: Andrew Burgess, MRN: [id number], DOB: 02-12-1971, 47 y.o.  Primary Physician:  Tower, Wynelle Fanny, MD   Chief Complaint  Patient presents with  . Hyperglycemia    Seen by eye doctor today and was told to have BS checked   Subjective:   Andrew Burgess is a 47 y.o. very pleasant male patient who presents with the following:  01/2016 with a1c of 6.7  BS > 400.  The patient was seen by his eye doctor, and when he examined his eyes and a dilated fashion, the eye doctor was concerned that the patient had diabetes mellitus, possible diabetes retinopathy, and told him that he needed to see his physician very quickly.  I worked with the patient in the day, and to his knowledge he did not have diabetes, but it looks as if he has had diabetes at least for the last 2 years.  He currently takes metformin.  Peeing a lot, blurred vision with seeing a long ways and feeling really tired.  He generally has been feel tired, weak, not himself, particularly over the last 2 weeks, but for some time even over and above this.  He has a poor diet.   Past Medical History, Surgical History, Social History, Family History, Problem List, Medications, and Allergies have been reviewed and updated if relevant.  Patient Active Problem List   Diagnosis Date Noted  . Routine general medical examination at a health care facility 11/08/2016  . Low testosterone in male 06/02/2016  . Low libido 05/22/2016  . CAD (coronary artery disease)   . Hyperlipidemia   . Diabetes mellitus ([place])   . Status post coronary artery stent placement   . Acute MI inferior lateral first episode care ([place]) 01/18/2016  . Muscle weakness (generalized) 08/29/2012  . Stress reaction 01/19/2011  . Screen for STD (sexually transmitted disease) 01/12/2011  . Obesity  11/14/2007  . ALLERGIC RHINITIS 11/11/2007  . Hypertriglyceridemia 03/04/2007    Past Medical History:  Diagnosis Date  . Allergy   . CAD (coronary artery disease)    a. 01/2016: Peak troponin 23.15. Left heart cath on 01/21/16 showed 100% occluded mid-dist RCA occlusion with heavy thrombus burden s/p DES. Rx asp thrombectomy and post procedure Aggrastat. Re look 01/21/16 revealed persistent thrombus in prox aspect of stent as well as a distal lesion both of which were intervened on successfully with placement of 2 additional DES.  . Diabetes mellitus ([place])   . Hyperglycemia   . Hyperlipidemia    triglyceriedes   . Hypertriglyceridemia   . Obesity   . Sickle cell trait [facility])     Past Surgical History:  Procedure Laterality Date  . ANKLE SURGERY    . CARDIAC CATHETERIZATION N/A 01/18/2016   Procedure: Left Heart Cath and Coronary Angiography;  Surgeon: Jettie Booze, MD;  Location: [place] CV LAB;  Service: Cardiovascular;  Laterality: N/A;  . CARDIAC CATHETERIZATION N/A 01/18/2016   Procedure: Coronary Stent Intervention;  Surgeon: Jettie Booze, MD;  Location: [place] CV LAB;  Service: Cardiovascular;  Laterality: N/A;  MID RCA  . CARDIAC CATHETERIZATION N/A 01/21/2016   Procedure: Left Heart Cath and Coronary Angiography;  Surgeon: Troy Sine, MD;  Location: [place] CV LAB;  Service: Cardiovascular;  Laterality: N/A;  . CARDIAC CATHETERIZATION N/A 01/21/2016   Procedure:  Coronary Stent Intervention;  Surgeon: Troy Sine, MD;  Location: [place] CV LAB;  Service: Cardiovascular;  Laterality: N/A;    Social History   Socioeconomic History  . Marital status: Married    Spouse name: Not on file  . Number of children: 2  . Years of education: Not on file  . Highest education level: Not on file  Social Needs  . Financial resource strain: Not on file  . Food insecurity - worry: Not on file  . Food insecurity - inability: Not on file  . Transportation needs  - medical: Not on file  . Transportation needs - non-medical: Not on file  Occupational History  . Occupation: Truck Geophysicist/field seismologist  Tobacco Use  . Smoking status: Former Smoker    Types: Cigars    Last attempt to quit: 01/18/2012    Years since quitting: 5.4  . Smokeless tobacco: Never Used  Substance and Sexual Activity  . Alcohol use: Yes    Alcohol/week: 0.0 oz    Comment: rare  . Drug use: No  . Sexual activity: Yes  Other Topics Concern  . Not on file  Social History Narrative   Some caffeine     Family History  Problem Relation Age of Onset  . Coronary artery disease Mother   . Heart attack Mother   . Hyperlipidemia Mother   . Alcohol abuse Father   . Hyperlipidemia Father   . Hypertension Father   . Hypertension Brother   . Cancer Maternal Grandmother        brain    No Known Allergies  Medication list reviewed and updated in full in [place].   GEN: No acute illnesses, no fevers, chills. GI: No n/v/d, eating normally Pulm: No SOB Interactive and getting along well at home.  Otherwise, ROS is as per the HPI.  Objective:   BP 125/77   Pulse 77   Temp 98.4 F (36.9 C) (Oral)   Ht 5\' 10"  (1.778 m)   Wt 232 lb 12 oz (105.6 kg)   BMI 33.40 kg/m   GEN: WDWN, NAD, Non-toxic, A & O x 3 HEENT: Atraumatic, Normocephalic. Neck supple. No masses, No LAD. Ears and Nose: No external deformity. CV: RRR, No M/G/R. No JVD. No thrill. No extra heart sounds. PULM: CTA B, no wheezes, crackles, rhonchi. No retractions. No resp. distress. No accessory muscle use. EXTR: No c/c/e NEURO Normal gait.  PSYCH: Normally interactive. Conversant. Not depressed or anxious appearing.  Calm demeanor.   Laboratory and Imaging Data:  Results for orders placed or performed in visit on 07/12/17  POCT glucose (manual entry)  Result Value Ref Range   POC Glucose >400 70 - 99 mg/dl     Assessment and Plan:   Diabetes mellitus type 2, uncontrolled, with complications ([place]) -  Plan: CBC with Differential/Platelet, Basic metabolic panel, Hepatic function panel, Hemoglobin A1c, Acetone  Elevated blood sugar - Plan: nitroGLYCERIN (NITROSTAT) 0.4 MG SL tablet, POCT glucose (manual entry)  Acute MI inferior lateral first episode care [facility])  Coronary artery disease involving native coronary artery of native heart without angina pectoris  Blood sugars greater than 400 in the office.  I am going to check some stat labs including those listed below.  Tentative plan is to increase metformin dosing to 2 tablets twice a day with close follow-up with his primary care provider.  His understanding is poor.  If laboratories appear to be more emergent in character, then evaluation at the  hospital is the step then.  Addendum: Basic Metabolic Panel:    Component Value Date/Time   NA 127 (L) 07/12/2017 1518   K 4.4 07/12/2017 1518   CL 95 (L) 07/12/2017 1518   CO2 22 07/12/2017 1518   BUN 25 (H) 07/12/2017 1518   CREATININE 1.72 (H) 07/12/2017 1518   CREATININE 1.53 (H) 01/31/2016 1545   GLUCOSE 701 (HH) 07/12/2017 1518   CALCIUM 9.2 07/12/2017 1518    Given above, I have asked him to go ASAP to the [facility] ER for eval.   Follow-up: 2 weeks with Dr. Glori Bickers  Meds ordered this encounter  Medications  . nitroGLYCERIN (NITROSTAT) 0.4 MG SL tablet    Sig: Place 1 tablet (0.4 mg total) under the tongue every 5 (five) minutes as needed for chest pain (3 DOSES MAX).    Dispense:  25 tablet    Refill:  2  . metFORMIN (GLUCOPHAGE) 500 MG tablet    Sig: Take 2 tablets (1,000 mg total) by mouth 2 (two) times daily with a meal.    Dispense:  120 tablet    Refill:  3   Medications Discontinued During This Encounter  Medication Reason  . nitroGLYCERIN (NITROSTAT) 0.4 MG SL tablet Reorder  . metFORMIN (GLUCOPHAGE) 500 MG tablet Reorder   Orders Placed This Encounter  Procedures  . CBC with Differential/Platelet  . Basic metabolic panel  . Hepatic function panel  . Hemoglobin  A1c  . Acetone  . POCT glucose (manual entry)    Signed,  Ndeye Tenorio T. Cherrise Occhipinti, MD   Allergies as of 07/12/2017   No Known Allergies     Medication List        Accurate as of 07/12/17  1:50 PM. Always use your most recent med list.          ASPIRIN PO Take 81 mg by mouth daily.   atorvastatin 80 MG tablet Commonly known as:  LIPITOR TAKE 1 TABLET (80 MG TOTAL) BY MOUTH DAILY AT 6 PM.   BRILINTA 90 MG Tabs tablet Generic drug:  ticagrelor TAKE 1 TABLET (90 MG TOTAL) BY MOUTH 2 (TWO) TIMES DAILY.   fenofibrate 160 MG tablet TAKE 1 TABLET BY MOUTH DAILY   metFORMIN 500 MG tablet Commonly known as:  GLUCOPHAGE Take 2 tablets (1,000 mg total) by mouth 2 (two) times daily with a meal.   metoprolol tartrate 25 MG tablet Commonly known as:  LOPRESSOR TAKE 1 TABLET BY MOUTH 2 TIMES DAILY   nitroGLYCERIN 0.4 MG SL tablet Commonly known as:  NITROSTAT Place 1 tablet (0.4 mg total) under the tongue every 5 (five) minutes as needed for chest pain (3 DOSES MAX).   omega-3 acid ethyl esters 1 g capsule Commonly known as:  LOVAZA TAKE 1 CAPSULE BY MOUTH 2 TIMES DAILY

## 2017-07-12 NOTE — ED Triage Notes (Signed)
Pt sent by PCP for CBG of >700. Pt states feeling dehydrated, very fatigued. Pt reports increased thirst and urination. AOX4. Pt noted to be diaphoretic on his forehead.

## 2017-07-13 DIAGNOSIS — E860 Dehydration: Secondary | ICD-10-CM

## 2017-07-13 DIAGNOSIS — E1101 Type 2 diabetes mellitus with hyperosmolarity with coma: Secondary | ICD-10-CM | POA: Diagnosis not present

## 2017-07-13 DIAGNOSIS — I2510 Atherosclerotic heart disease of native coronary artery without angina pectoris: Secondary | ICD-10-CM | POA: Diagnosis not present

## 2017-07-13 DIAGNOSIS — N179 Acute kidney failure, unspecified: Secondary | ICD-10-CM | POA: Diagnosis not present

## 2017-07-13 LAB — BASIC METABOLIC PANEL
ANION GAP: 12 (ref 5–15)
Anion gap: 12 (ref 5–15)
BUN: 23 mg/dL — AB (ref 6–20)
BUN: 20 mg/dL (ref 6–20)
CALCIUM: 8.6 mg/dL — AB (ref 8.9–10.3)
CALCIUM: 8.6 mg/dL — AB (ref 8.9–10.3)
CO2: 19 mmol/L — ABNORMAL LOW (ref 22–32)
CO2: 17 mmol/L — AB (ref 22–32)
Chloride: 106 mmol/L (ref 101–111)
Chloride: 107 mmol/L (ref 101–111)
Creatinine, Ser: 1.13 mg/dL (ref 0.61–1.24)
Creatinine, Ser: 1.21 mg/dL (ref 0.61–1.24)
GFR calc Af Amer: >60 mL/min (ref >60)
GFR calc Af Amer: >60 mL/min (ref >60)
GFR calc non Af Amer: >60 mL/min (ref >60)
GLUCOSE: 188 mg/dL — AB (ref 65–99)
GLUCOSE: 235 mg/dL — AB (ref 65–99)
Potassium: 3.2 mmol/L — ABNORMAL LOW (ref 3.5–5.1)
Potassium: 3.9 mmol/L (ref 3.5–5.1)
Sodium: 137 mmol/L (ref 135–145)
Sodium: 136 mmol/L (ref 135–145)

## 2017-07-13 LAB — GLUCOSE, CAPILLARY
GLUCOSE-CAPILLARY: 172 mg/dL — AB (ref 65–99)
GLUCOSE-CAPILLARY: 187 mg/dL — AB (ref 65–99)
GLUCOSE-CAPILLARY: 223 mg/dL — AB (ref 65–99)
GLUCOSE-CAPILLARY: 224 mg/dL — AB (ref 65–99)
GLUCOSE-CAPILLARY: 278 mg/dL — AB (ref 65–99)
GLUCOSE-CAPILLARY: 285 mg/dL — AB (ref 65–99)
GLUCOSE-CAPILLARY: 305 mg/dL — AB (ref 65–99)
Glucose-Capillary: 141 mg/dL — ABNORMAL HIGH (ref 65–99)
Glucose-Capillary: 150 mg/dL — ABNORMAL HIGH (ref 65–99)
Glucose-Capillary: 156 mg/dL — ABNORMAL HIGH (ref 65–99)
Glucose-Capillary: 156 mg/dL — ABNORMAL HIGH (ref 65–99)
Glucose-Capillary: 209 mg/dL — ABNORMAL HIGH (ref 65–99)
Glucose-Capillary: 233 mg/dL — ABNORMAL HIGH (ref 65–99)
Glucose-Capillary: 300 mg/dL — ABNORMAL HIGH (ref 65–99)

## 2017-07-13 LAB — HEMOGLOBIN A1C
HEMOGLOBIN A1C: 11.4 % — AB (ref 4.8–5.6)
Hgb A1c MFr Bld: 12.4 % of total Hgb — ABNORMAL HIGH (ref <5.7)
MEAN PLASMA GLUCOSE: 309 (calc)
MEAN PLASMA GLUCOSE: 280.48 mg/dL
eAG (mmol/L): 17.1 (calc)

## 2017-07-13 LAB — CBC
HCT: 34.6 % — ABNORMAL LOW (ref 39.0–52.0)
Hemoglobin: 12.1 g/dL — ABNORMAL LOW (ref 13.0–17.0)
MCH: 29.2 pg (ref 26.0–34.0)
MCHC: 35 g/dL (ref 30.0–36.0)
MCV: 83.4 fL (ref 78.0–100.0)
Platelets: 201 10*3/uL (ref 150–400)
RBC: 4.15 MIL/uL — ABNORMAL LOW (ref 4.22–5.81)
RDW: 13.1 % (ref 11.5–15.5)
WBC: 6.8 10*3/uL (ref 4.0–10.5)

## 2017-07-13 LAB — HIV ANTIBODY (ROUTINE TESTING W REFLEX): HIV SCREEN 4TH GENERATION: NONREACTIVE

## 2017-07-13 LAB — TROPONIN I: Troponin I: <0.03 ng/mL (ref <0.03)

## 2017-07-13 MED ORDER — INSULIN GLARGINE 100 UNIT/ML ~~LOC~~ SOLN
10.0000 [IU] | Freq: Every day | SUBCUTANEOUS | Status: DC
  Administered 2017-07-13: 10 [IU] via SUBCUTANEOUS
  Filled 2017-07-13: qty 0.1

## 2017-07-13 MED ORDER — INSULIN STARTER KIT- PEN NEEDLES (ENGLISH)
1.0000 | Freq: Once | Status: DC
  Filled 2017-07-13: qty 1

## 2017-07-13 MED ORDER — INSULIN GLARGINE 100 UNIT/ML ~~LOC~~ SOLN
15.0000 [IU] | Freq: Every day | SUBCUTANEOUS | Status: DC
  Administered 2017-07-14: 15 [IU] via SUBCUTANEOUS
  Filled 2017-07-13: qty 0.15

## 2017-07-13 MED ORDER — POTASSIUM CHLORIDE CRYS ER 20 MEQ PO TBCR
40.0000 meq | EXTENDED_RELEASE_TABLET | Freq: Once | ORAL | Status: AC
  Administered 2017-07-13: 40 meq via ORAL
  Filled 2017-07-13: qty 2

## 2017-07-13 MED ORDER — INSULIN ASPART 100 UNIT/ML ~~LOC~~ SOLN
0.0000 [IU] | SUBCUTANEOUS | Status: DC
  Administered 2017-07-13 (×3): 8 [IU] via SUBCUTANEOUS
  Administered 2017-07-13: 2 [IU] via SUBCUTANEOUS
  Administered 2017-07-14: 5 [IU] via SUBCUTANEOUS
  Administered 2017-07-14 (×2): 3 [IU] via SUBCUTANEOUS
  Administered 2017-07-14: 8 [IU] via SUBCUTANEOUS
  Administered 2017-07-14 (×2): 5 [IU] via SUBCUTANEOUS
  Administered 2017-07-15 (×3): 3 [IU] via SUBCUTANEOUS

## 2017-07-13 MED ORDER — ENOXAPARIN SODIUM 60 MG/0.6ML ~~LOC~~ SOLN
50.0000 mg | SUBCUTANEOUS | Status: DC
  Administered 2017-07-13 – 2017-07-14 (×2): 50 mg via SUBCUTANEOUS
  Filled 2017-07-13 (×2): qty 0.6

## 2017-07-13 MED ORDER — LIVING WELL WITH DIABETES BOOK
Freq: Once | Status: AC
  Administered 2017-07-13: 11:00:00
  Filled 2017-07-13: qty 1

## 2017-07-13 NOTE — Progress Notes (Signed)
PROGRESS NOTE  Andrew Burgess VFI:[id number] DOB: May 22, 1971 DOA: 07/12/2017 PCP: Abner Greenspan, MD  HPI/Recap of past 24 hours: HPI from Dr Derrill Kay on 07/12/17 Andrew Burgess is a 47 y.o. male with medical history significant of diabetes, CAD s/p DES in 01/2016 presents to the ED, c/o polydipsia, polyuria, 13 pound weight loss, generalized weakness and dizzy spell worsening for the past 2 weeks. Patient went to see his primary care physician and his glucose was noted to be very elevated, he was then sent to the ED. Pt currently takes only metformin at home and has been compliant with this, but has been drinking a lot of fruit punch and has sedentary lifestyle as a truck driver. Denied any other symptoms. BS was noted to be over 700. Venous blood gas has pH normal, 7.3, with PCO2 39 decreased consistent with compensated metabolic acidosis, normal anion gap of 14, AKI. Pt admitted for further management. Of note, pt is very concerned about the need to start insulin and his job as a Administrator. If pt is put on insulin, he may loose his job due to ??hypoglyemic risk.  Today, patient denied any new symptoms, reported feeling better overall.  Patient denies any chest pain, shortness of breath, nausea/vomiting, diarrhea/constipation, fever/chills.  Patient is able to tolerate orally.  Assessment/Plan: Principal Problem:   Hyperosmolar non-ketotic state in patient with type 2 diabetes mellitus ([place]) Active Problems:   Status post coronary artery stent placement   CAD (coronary artery disease)   Diabetes mellitus ([place])   AKI (acute kidney injury) ([place])  Uncontrolled DM type 2 with hyperglycemia Likely hyperglycemia hyperosmolar nonketotic state A1c 12.4, current CBG >200, still needs better control BS >700, VBG: pH normal, 7.3, with PCO2 39, anion gap 14 Likely due to poor diet compliance, on metformin S/P glucostabilizer, currently on SSI, lantus IVF now qt 125cc/hr Diabetes coordinator  on board With A1c >10, pt requires insulin. 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 May consider increasing his metformin, and adding on a sulfpnylurea or the newer meds such as [place] etc upon discharge  AKI  Resolved Likely due to above Continue IVF  CAD s/p DES in 8/18 Chest pain free, asymptomatic Continue ASA, brilinta, statins, fenofibrate, metoprolol     Code Status: Full  Family Communication: None at bedside  Disposition Plan: Home once stable, likely 07/14/17   Consultants:  None  Procedures:  None  Antimicrobials:  None  DVT prophylaxis:  Lovenox   Objective: Vitals:   07/13/17 0400 07/13/17 0601 07/13/17 1036 07/13/17 1352  BP:  (!) 101/59 (!) 111/53 114/70  Pulse:  62 76 72  Resp:  18  17  Temp:  97.7 F (36.5 C)  98.1 F (36.7 C)  TempSrc:  Oral  Oral  SpO2:  97% 99% 100%  Weight: 105.6 kg (232 lb 12.9 oz)     Height: 5\' 10"  (1.778 m)       Intake/Output Summary (Last 24 hours) at 07/13/2017 1844 Last data filed at 07/13/2017 1610 Gross per 24 hour  Intake 924 ml  Output 2200 ml  Net -1276 ml   Filed Weights   07/13/17 0400  Weight: 105.6 kg (232 lb 12.9 oz)    Exam:   General: Alert, awake, oriented  Cardiovascular: S1, S2 present, no added heart sounds  Respiratory: Chest clear bilaterally  Abdomen: Soft, nontender, nondistended, bowel sounds present  Musculoskeletal: No pedal edema bilaterally  Skin: Normal  Psychiatry: Normal mood   Data Reviewed: CBC: Recent Labs  Lab 07/12/17 1252 07/12/17 1729 07/13/17 0244  WBC 7.3 7.9 6.8  NEUTROABS 4.5  --   --   HGB 13.6 12.8* 12.1*  HCT 39.7 37.0* 34.6*  MCV 85.4 83.7 83.4  PLT 216.0 224 664   Basic Metabolic Panel: Recent Labs  Lab 07/12/17 1518 07/12/17 1729 07/13/17 0244 07/13/17 1330  NA 127* 128* 137 136  K 4.4 4.8 3.2* 3.9  CL 95* 96* 106 107  CO2 22 18* 19* 17*  GLUCOSE 701* 733* 188* 235*  BUN 25* 27* 23* 20    CREATININE 1.72* 2.20* 1.13 1.21  CALCIUM 9.2 9.0 8.6* 8.6*   GFR: Estimated Creatinine Clearance: 90.8 mL/min (by C-G formula based on SCr of 1.21 mg/dL). Liver Function Tests: Recent Labs  Lab 07/12/17 1252  AST 46*  ALT 42  ALKPHOS 70  BILITOT 0.5  PROT 7.5  ALBUMIN 4.4   No results for input(s): LIPASE, AMYLASE in the last 168 hours. No results for input(s): AMMONIA in the last 168 hours. Coagulation Profile: No results for input(s): INR, PROTIME in the last 168 hours. Cardiac Enzymes: Recent Labs  Lab 07/13/17 0244 07/13/17 1330  TROPONINI <0.03 <0.03   BNP (last 3 results) No results for input(s): PROBNP in the last 8760 hours. HbA1C: Recent Labs    07/12/17 1402 07/13/17 0244  HGBA1C 12.4* 11.4*   CBG: Recent Labs  Lab 07/13/17 0623 07/13/17 0714 07/13/17 0824 07/13/17 1204 07/13/17 1608  GLUCAP 172* 156* 141* 278* 285*   Lipid Profile: No results for input(s): CHOL, HDL, LDLCALC, TRIG, CHOLHDL, LDLDIRECT in the last 72 hours. Thyroid Function Tests: No results for input(s): TSH, T4TOTAL, FREET4, T3FREE, THYROIDAB in the last 72 hours. Anemia Panel: No results for input(s): VITAMINB12, FOLATE, FERRITIN, TIBC, IRON, RETICCTPCT in the last 72 hours. Urine analysis:    Component Value Date/Time   COLORURINE COLORLESS (A) 07/12/2017 1722   APPEARANCEUR CLEAR 07/12/2017 1722   LABSPEC 1.022 07/12/2017 1722   PHURINE 6.0 07/12/2017 1722   GLUCOSEU >=500 (A) 07/12/2017 1722   HGBUR NEGATIVE 07/12/2017 1722   BILIRUBINUR NEGATIVE 07/12/2017 1722   KETONESUR NEGATIVE 07/12/2017 1722   PROTEINUR NEGATIVE 07/12/2017 1722   NITRITE NEGATIVE 07/12/2017 1722   LEUKOCYTESUR NEGATIVE 07/12/2017 1722   Sepsis Labs: @LABRCNTIP (procalcitonin:4,lacticidven:4)  )No results found for this or any previous visit (from the past 240 hour(s)).    Studies: No results found.  Scheduled Meds: . aspirin  81 mg Oral Daily  . atorvastatin  80 mg Oral q1800  .  fenofibrate  160 mg Oral Daily  . insulin aspart  0-15 Units Subcutaneous Q4H  . insulin glargine  10 Units Subcutaneous Daily  . metoprolol tartrate  25 mg Oral BID  . ticagrelor  90 mg Oral BID    Continuous Infusions: . sodium chloride 150 mL/hr at 07/13/17 1649  . insulin (NOVOLIN-R) infusion Stopped (07/13/17 0934)     LOS: 1 day     Alma Friendly, MD [facility]  If 7PM-7AM, please contact night-coverage [email or web address] Password [facility] 07/13/2017, 6:44 PM

## 2017-07-13 NOTE — Progress Notes (Signed)
Inpatient Diabetes Program Recommendations  AACE/ADA: New Consensus Statement on Inpatient Glycemic Control (2015)  Target Ranges:  Prepandial:   less than 140 mg/dL      Peak postprandial:   less than 180 mg/dL (1-2 hours)      Critically ill patients:  140 - 180 mg/dL   Spoke with patient about diabetes and home regimen for diabetes control. Patient reports that he was diagnosed 2 years ago and has been on Metformin. He was never told to check his glucose, watch his diet, or exercise. Through conversation, patient is a Production designer, theatre/television/film. Patient is currently on Metformin 500 mg BID. Spoke w/ Dr. Horris Latino about plan of care. Considering increasing Metformin 1000 mg BID and start a sulfonylurea.  Patient also drinks fruit punch and lemonade. And is sedentary. Patient reports that he knows he needs to start exercising due to his past heart attack. Spoke with patient about current A1c levels (11.4%).  Patient to change beverage options and increase physical activity in addition to medications changes and PCP follow up. Spoke with patient about the importance of glucose control.   Discussed glucose and A1C goals. Discussed importance of checking CBGs and maintaining good CBG control to prevent long-term and short-term complications. Explained how hyperglycemia leads to damage within blood vessels which lead to the common complications seen with uncontrolled diabetes. Stressed to the patient the importance of improving glycemic control to prevent further complications from uncontrolled diabetes. Discussed impact of nutrition, exercise, stress, sickness, and medications on diabetes control. Discussed carbohydrates, carbohydrate goals per day and meal, along with portion sizes. Encouraged patient to check glucose 2 times per day. Patient verbalized understanding of information discussed and he states that he has no further questions at this time related to diabetes.   Thanks,  Tama Headings RN,  MSN, [facility] Inpatient Diabetes Coordinator Team Pager [phone] (8a-5p)

## 2017-07-13 NOTE — Progress Notes (Addendum)
Inpatient Diabetes Program Recommendations  AACE/ADA: New Consensus Statement on Inpatient Glycemic Control (2015)  Target Ranges:  Prepandial:   less than 140 mg/dL      Peak postprandial:   less than 180 mg/dL (1-2 hours)      Critically ill patients:  140 - 180 mg/dL   Lab Results  Component Value Date   GLUCAP 141 (H) 07/13/2017   HGBA1C 11.4 (H) 07/13/2017   Review of Glycemic Control  Diabetes history: DM 2 Outpatient Diabetes medications: Metformin 1,000 mg BID Current orders for Inpatient glycemic control: Lantus 10 units, Novolog Moderate Correction 0-15 units Q4 hours  Inpatient Diabetes Program Recommendations:    Prior A1c level around 12% which would have required insulin at that time.  Based on A1c level patient will need insulin and oral therapy at time of discharge. Patient has [employer], Engineer, agricultural (lantus similar) is preferred.  Patient ordered and received weight based basal insulin Lantus 10 units transitioning off IV insulin (0.1units./kg). Patient may require more. Would also recommend continuing metformin at time of d/c.  Will see patient today and show insulin pen.  Thanks,  Tama Headings RN, MSN, [facility] Inpatient Diabetes Coordinator Team Pager [phone] (8a-5p)

## 2017-07-14 DIAGNOSIS — N179 Acute kidney failure, unspecified: Secondary | ICD-10-CM

## 2017-07-14 DIAGNOSIS — E1101 Type 2 diabetes mellitus with hyperosmolarity with coma: Secondary | ICD-10-CM | POA: Diagnosis not present

## 2017-07-14 DIAGNOSIS — E11319 Type 2 diabetes mellitus with unspecified diabetic retinopathy without macular edema: Secondary | ICD-10-CM | POA: Diagnosis not present

## 2017-07-14 DIAGNOSIS — I2510 Atherosclerotic heart disease of native coronary artery without angina pectoris: Secondary | ICD-10-CM

## 2017-07-14 LAB — GLUCOSE, CAPILLARY
GLUCOSE-CAPILLARY: 203 mg/dL — AB (ref 65–99)
GLUCOSE-CAPILLARY: 212 mg/dL — AB (ref 65–99)
GLUCOSE-CAPILLARY: 266 mg/dL — AB (ref 65–99)
GLUCOSE-CAPILLARY: 152 mg/dL — AB (ref 65–99)
GLUCOSE-CAPILLARY: 186 mg/dL — AB (ref 65–99)
Glucose-Capillary: 114 mg/dL — ABNORMAL HIGH (ref 65–99)

## 2017-07-14 LAB — BASIC METABOLIC PANEL
ANION GAP: 7 (ref 5–15)
BUN: 16 mg/dL (ref 6–20)
CO2: 21 mmol/L — AB (ref 22–32)
CREATININE: 1.25 mg/dL — AB (ref 0.61–1.24)
Calcium: 8.3 mg/dL — ABNORMAL LOW (ref 8.9–10.3)
Chloride: 110 mmol/L (ref 101–111)
GFR calc non Af Amer: >60 mL/min (ref >60)
Glucose, Bld: 219 mg/dL — ABNORMAL HIGH (ref 65–99)
POTASSIUM: 4.1 mmol/L (ref 3.5–5.1)
SODIUM: 138 mmol/L (ref 135–145)

## 2017-07-14 LAB — ACETONE: Acetone: NOT DETECTED mg/dL

## 2017-07-14 MED ORDER — METFORMIN HCL 500 MG PO TABS
1000.0000 mg | ORAL_TABLET | Freq: Two times a day (BID) | ORAL | Status: DC
  Administered 2017-07-14 – 2017-07-15 (×2): 1000 mg via ORAL
  Filled 2017-07-14 (×2): qty 2

## 2017-07-14 MED ORDER — GLIPIZIDE 5 MG PO TABS
5.0000 mg | ORAL_TABLET | Freq: Every day | ORAL | Status: DC
  Administered 2017-07-14 – 2017-07-15 (×2): 5 mg via ORAL
  Filled 2017-07-14 (×2): qty 1

## 2017-07-14 NOTE — Progress Notes (Signed)
PROGRESS NOTE    Andrew Burgess  HAL:[id number] DOB: [birth year] DOA: 07/12/2017 PCP: Abner Greenspan, MD    Brief Narrative:  `47 year old male who presented with dizziness, patient does have a significant past medical history of type 2 diabetes mellitus coronary artery disease status post stent August 2017. Patient complained of polydipsia and polyuria along with 13 pound weight loss. He was found hyperglycemic his primary care provider, he was sent to the hospital for further evaluation. On initial physical examination blood pressure 133/77, heart rate 66, respiratory 16, temperature 98.4, oxygen saturation 98%. Moist mucous membranes, lungs clear to auscultation bilaterally, heart S1-S2 present rhythmic, abdomen soft nontender, no lower extremity edema.   Patient was admitted to the hospital working diagnosis of hyperglycemic hyperosmolar nonketotic state.    Assessment & Plan:   Principal Problem:   Hyperosmolar non-ketotic state in patient with type 2 diabetes mellitus ([place]) Active Problems:   Status post coronary artery stent placement   CAD (coronary artery disease)   Diabetes mellitus ([place])   AKI (acute kidney injury) ([place])   1. Hyperglycemic nonketotic state. Improved hyperglycemia, but still not controlled, will continue insulin sliding scale for glucose cover and monitoring. No anion gap, patient tolerating po well. Will resume oral agents with metformin and glipizide.   2. Type 2 diabetes mellitus. Patient non compliant with diabetic prudent diet, will order diabetic teaching. 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 Explained that recommendations are to use insulin in severe hyperglycemia, like his presentation. He is aware and understands recommendations, but prefers oral agents in order to keep is work. Will do trial of oral agents in the hospital, but if fails he will need to be discharge on insulin.   3. AKI on  Chronic kidney disease stage II. Serum cr at it's baseline, patient has ckd stage II with calculated GFR 71. Will follow renal panel in am.  4. Coronary artery disease. Chest pain free, continue aggressive life style modifications. Aspirin/ ticagrelor, atorvastatin, fenofibrate.  5. HTN. Continue blood pressure control with metoprolol.   DVT prophylaxis: enoxaparin  Code Status: full Family Communication: no family at the bedside Disposition Plan: home   Consultants:     Procedures:     Antimicrobials:       Subjective: Patient is feeling better, persistent blurry vision, weakness and malaise have improved, no nausea or vomiting.   Objective: Vitals:   07/14/17 0500 07/14/17 0509 07/14/17 0839 07/14/17 1447  BP:  115/63 125/82 126/79  Pulse:  (!) 59 67 64  Resp:  18  20  Temp:  97.7 F (36.5 C)  98.7 F (37.1 C)  TempSrc:    Oral  SpO2:  100%  99%  Weight: 107.3 kg (236 lb 8 oz)     Height:        Intake/Output Summary (Last 24 hours) at 07/14/2017 1557 Last data filed at 07/14/2017 0900 Gross per 24 hour  Intake 4438.38 ml  Output 1100 ml  Net 3338.38 ml   Filed Weights   07/13/17 0400 07/14/17 0500  Weight: 105.6 kg (232 lb 12.9 oz) 107.3 kg (236 lb 8 oz)    Examination:   General: Not in pain or dyspnea, deconditioned Neurology: Awake and alert, non focal  E ENT: no pallor, no icterus, oral mucosa moist Cardiovascular: No JVD. S1-S2 present, rhythmic, no gallops, rubs, or murmurs. No lower extremity edema. Pulmonary: vesicular breath sounds bilaterally, adequate air movement,  no wheezing, rhonchi or rales. Gastrointestinal. Abdomen flat, no organomegaly, non tender, no rebound or guarding Skin. No rashes Musculoskeletal: no joint deformities     Data Reviewed: I have personally reviewed following labs and imaging studies  CBC: Recent Labs  Lab 07/12/17 1252 07/12/17 1729 07/13/17 0244  WBC 7.3 7.9 6.8  NEUTROABS 4.5  --   --   HGB 13.6  12.8* 12.1*  HCT 39.7 37.0* 34.6*  MCV 85.4 83.7 83.4  PLT 216.0 224 321   Basic Metabolic Panel: Recent Labs  Lab 07/12/17 1518 07/12/17 1729 07/13/17 0244 07/13/17 1330 07/14/17 0438  NA 127* 128* 137 136 138  K 4.4 4.8 3.2* 3.9 4.1  CL 95* 96* 106 107 110  CO2 22 18* 19* 17* 21*  GLUCOSE 701* 733* 188* 235* 219*  BUN 25* 27* 23* 20 16  CREATININE 1.72* 2.20* 1.13 1.21 1.25*  CALCIUM 9.2 9.0 8.6* 8.6* 8.3*   GFR: Estimated Creatinine Clearance: 88.6 mL/min (A) (by C-G formula based on SCr of 1.25 mg/dL (H)). Liver Function Tests: Recent Labs  Lab 07/12/17 1252  AST 46*  ALT 42  ALKPHOS 70  BILITOT 0.5  PROT 7.5  ALBUMIN 4.4   No results for input(s): LIPASE, AMYLASE in the last 168 hours. No results for input(s): AMMONIA in the last 168 hours. Coagulation Profile: No results for input(s): INR, PROTIME in the last 168 hours. Cardiac Enzymes: Recent Labs  Lab 07/13/17 0244 07/13/17 1330  TROPONINI <0.03 <0.03   BNP (last 3 results) No results for input(s): PROBNP in the last 8760 hours. HbA1C: Recent Labs    07/12/17 1402 07/13/17 0244  HGBA1C 12.4* 11.4*   CBG: Recent Labs  Lab 07/13/17 2029 07/14/17 0003 07/14/17 0355 07/14/17 0807 07/14/17 1203  GLUCAP 300* 223* 203* 212* 266*   Lipid Profile: No results for input(s): CHOL, HDL, LDLCALC, TRIG, CHOLHDL, LDLDIRECT in the last 72 hours. Thyroid Function Tests: No results for input(s): TSH, T4TOTAL, FREET4, T3FREE, THYROIDAB in the last 72 hours. Anemia Panel: No results for input(s): VITAMINB12, FOLATE, FERRITIN, TIBC, IRON, RETICCTPCT in the last 72 hours.    Radiology Studies: I have reviewed all of the imaging during this hospital visit personally     Scheduled Meds: . aspirin  81 mg Oral Daily  . atorvastatin  80 mg Oral q1800  . enoxaparin (LOVENOX) injection  50 mg Subcutaneous Q24H  . fenofibrate  160 mg Oral Daily  . glipiZIDE  5 mg Oral QAC breakfast  . insulin aspart   0-15 Units Subcutaneous Q4H  . metFORMIN  1,000 mg Oral BID WC  . metoprolol tartrate  25 mg Oral BID  . ticagrelor  90 mg Oral BID   Continuous Infusions:   LOS: 2 days        Mauricio Gerome Apley, MD [facility] Pager [phone]

## 2017-07-14 NOTE — Progress Notes (Signed)
Inpatient Diabetes Program Recommendations  AACE/ADA: New Consensus Statement on Inpatient Glycemic Control (2015)  Target Ranges:  Prepandial:   less than 140 mg/dL      Peak postprandial:   less than 180 mg/dL (1-2 hours)      Critically ill patients:  140 - 180 mg/dL   Lab Results  Component Value Date   GLUCAP 212 (H) 07/14/2017   HGBA1C 11.4 (H) 07/13/2017   Review of Glycemic Control  Diabetes history: DM 2 Outpatient Diabetes medications: Metformin 500 mg BID Current orders for Inpatient glycemic control: Lantus 15 units, Novolog Moderate Correction 0-15 units Q4 hours   Inpatient Diabetes Program Recommendations:    Per insurance would recommend  1)   Metformin (tier 1) covered, 1,000 mg BID  2)   SGLT2 inhibitor (tier 2) Covered: Jardiance 25 mg Daily and also has cardiovascular protection properties as well. Patient will need education on staying hydrated with this medication, which I will cover today.  Maybe use these in the future 3)   Sulfonylureas (tier 1) covered:        Glimepiride        Glipizide        Glyburide   Thanks,  Tama Headings RN, MSN, [place] Diabetes Coordinator Team Pager [phone] (8a-5p)

## 2017-07-14 NOTE — Progress Notes (Signed)
PROGRESS NOTE  Andrew Burgess JOI:[id number] DOB: 1970/07/20 DOA: 07/12/2017 PCP: Abner Greenspan, MD   LOS: 2 days   Brief Narrative / Interim history: 47 y.o. Male with history of MI, 3 stent placement 2 years ago, Hyperlipidemia, and DM x 2 years presented to the ED after 2 weeks of polyuria, polydipsia, blurry vision, and generalized weakness.2 weeks prior he saw Optomolologist who recommended he see PCP for DM management. At visit PCP reported BG>400 and referred the patient to ER. Upon admission glucose was >700, A1C 11.4, normal anion gap. Patient denied fever, chest pain, N/V/D, sob, cough, or recent illness. 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 Non compliant with diet and recently been drinking lots of juice and soda. Creatinine was 2.2, up from baseline of 1.4. No history of kidney disease. IVF and IV insulin started.   Patient was admitted for working diagnosis of Hyperosmolar non-ketotic state in patient with type 2 DM and AKI.   Assessment & Plan: Principal Problem:   Hyperosmolar non-ketotic state in patient with type 2 diabetes mellitus ([place]) Active Problems:   Status post coronary artery stent placement   CAD (coronary artery disease)   Diabetes mellitus ([place])   AKI (acute kidney injury) ([place])   Hyperosmolar non-ketotic state in patient with type II DM On admission Glucose >700, normal anion gap. A1C 11.4, and generalized weakness. Received IV insulin Patient stable. BG this am 212.  Patient reported he cannot take insuline due to job restrictions. Increase Metformin to 1000 mg BID and start glipizide 5mg  QD. If levels stable overnight tomorrow possible discharge. Diabetes counseling  AKI Patient most likely has chronic kidney disease with baseline Cr around 1.4 On admission Cr 2.2. IVF started Recent Cr 1.25 Monitor Creatinine  Status post CAD, 3 stent placement MI 2 years ago, 3 stents placed No Chest pain, SOB, edema.  Troponin wnl Continue Brilinta, aspirin, metroprolol, fenofibrate   DVT prophylaxis: Aspirin, Lovenox Code Status: FULL Family Communication: none Disposition Plan: home  Consultants:   Diabetes Coordinator  Procedures:   none  Antimicrobials:  none  Subjective: Patient alert sitting in bed. He is feeling much better today.  Objective: Vitals:   07/13/17 2238 07/14/17 0500 07/14/17 0509 07/14/17 0839  BP: 126/76  115/63 125/82  Pulse: 71  (!) 59 67  Resp: 12  18   Temp: 98.2 F (36.8 C)  97.7 F (36.5 C)   TempSrc:      SpO2: 100%  100%   Weight:  107.3 kg (236 lb 8 oz)    Height:        Intake/Output Summary (Last 24 hours) at 07/14/2017 1237 Last data filed at 07/14/2017 0900 Gross per 24 hour  Intake 4918.38 ml  Output 1400 ml  Net 3518.38 ml   Filed Weights   07/13/17 0400 07/14/17 0500  Weight: 105.6 kg (232 lb 12.9 oz) 107.3 kg (236 lb 8 oz)    Examination:  Constitutional: NAD Eyes: PERRL, lids and conjunctivae normal ENMT: Mucous membranes are moist. No oropharyngeal exudates Neck: normal, supple, Respiratory: clear to auscultation bilaterally, no wheezing, no crackles. Normal respiratory effort. No accessory muscle use.  Cardiovascular: Regular rate and rhythm, no murmurs / rubs / gallops. No LE edema. 2+ pedal pulses..  Abdomen: no tenderness. Bowel sounds positive.  Musculoskeletal:  No joint deformity upper and lower extremities. No contractures. Normal muscle tone.  Skin: no rashes, lesions, ulcers. No induration Neurologic: CN 2-12 grossly intact.  Psychiatric:  Normal judgment and insight. Alert and oriented x 3. Normal mood.    Data Reviewed: I have independently reviewed following labs and imaging studies   CBC: Recent Labs  Lab 07/12/17 1252 07/12/17 1729 07/13/17 0244  WBC 7.3 7.9 6.8  NEUTROABS 4.5  --   --   HGB 13.6 12.8* 12.1*  HCT 39.7 37.0* 34.6*  MCV 85.4 83.7 83.4  PLT 216.0 224 921   Basic Metabolic  Panel: Recent Labs  Lab 07/12/17 1518 07/12/17 1729 07/13/17 0244 07/13/17 1330 07/14/17 0438  NA 127* 128* 137 136 138  K 4.4 4.8 3.2* 3.9 4.1  CL 95* 96* 106 107 110  CO2 22 18* 19* 17* 21*  GLUCOSE 701* 733* 188* 235* 219*  BUN 25* 27* 23* 20 16  CREATININE 1.72* 2.20* 1.13 1.21 1.25*  CALCIUM 9.2 9.0 8.6* 8.6* 8.3*   GFR: Estimated Creatinine Clearance: 88.6 mL/min (A) (by C-G formula based on SCr of 1.25 mg/dL (H)). Liver Function Tests: Recent Labs  Lab 07/12/17 1252  AST 46*  ALT 42  ALKPHOS 70  BILITOT 0.5  PROT 7.5  ALBUMIN 4.4   No results for input(s): LIPASE, AMYLASE in the last 168 hours. No results for input(s): AMMONIA in the last 168 hours. Coagulation Profile: No results for input(s): INR, PROTIME in the last 168 hours. Cardiac Enzymes: Recent Labs  Lab 07/13/17 0244 07/13/17 1330  TROPONINI <0.03 <0.03   BNP (last 3 results) No results for input(s): PROBNP in the last 8760 hours. HbA1C: Recent Labs    07/12/17 1402 07/13/17 0244  HGBA1C 12.4* 11.4*   CBG: Recent Labs  Lab 07/13/17 2029 07/14/17 0003 07/14/17 0355 07/14/17 0807 07/14/17 1203  GLUCAP 300* 223* 203* 212* 266*   Lipid Profile: No results for input(s): CHOL, HDL, LDLCALC, TRIG, CHOLHDL, LDLDIRECT in the last 72 hours. Thyroid Function Tests: No results for input(s): TSH, T4TOTAL, FREET4, T3FREE, THYROIDAB in the last 72 hours. Anemia Panel: No results for input(s): VITAMINB12, FOLATE, FERRITIN, TIBC, IRON, RETICCTPCT in the last 72 hours. Urine analysis:    Component Value Date/Time   COLORURINE COLORLESS (A) 07/12/2017 1722   APPEARANCEUR CLEAR 07/12/2017 1722   LABSPEC 1.022 07/12/2017 1722   PHURINE 6.0 07/12/2017 1722   GLUCOSEU >=500 (A) 07/12/2017 1722   HGBUR NEGATIVE 07/12/2017 1722   BILIRUBINUR NEGATIVE 07/12/2017 1722   KETONESUR NEGATIVE 07/12/2017 1722   PROTEINUR NEGATIVE 07/12/2017 1722   NITRITE NEGATIVE 07/12/2017 1722   LEUKOCYTESUR NEGATIVE  07/12/2017 1722   Sepsis Labs: Invalid input(s): PROCALCITONIN, LACTICIDVEN  No results found for this or any previous visit (from the past 240 hour(s)).    Radiology Studies: No results found.   Scheduled Meds: . aspirin  81 mg Oral Daily  . atorvastatin  80 mg Oral q1800  . enoxaparin (LOVENOX) injection  50 mg Subcutaneous Q24H  . fenofibrate  160 mg Oral Daily  . insulin aspart  0-15 Units Subcutaneous Q4H  . insulin glargine  15 Units Subcutaneous Daily  . metoprolol tartrate  25 mg Oral BID  . ticagrelor  90 mg Oral BID   Continuous Infusions: . sodium chloride 125 mL/hr at 07/14/17 1114       Time spent:     Loreda Silverio, PA-S

## 2017-07-15 DIAGNOSIS — E11319 Type 2 diabetes mellitus with unspecified diabetic retinopathy without macular edema: Secondary | ICD-10-CM | POA: Diagnosis not present

## 2017-07-15 DIAGNOSIS — I2510 Atherosclerotic heart disease of native coronary artery without angina pectoris: Secondary | ICD-10-CM | POA: Diagnosis not present

## 2017-07-15 DIAGNOSIS — N179 Acute kidney failure, unspecified: Secondary | ICD-10-CM | POA: Diagnosis not present

## 2017-07-15 DIAGNOSIS — E1101 Type 2 diabetes mellitus with hyperosmolarity with coma: Secondary | ICD-10-CM | POA: Diagnosis not present

## 2017-07-15 LAB — BASIC METABOLIC PANEL
Anion gap: 9 (ref 5–15)
BUN: 13 mg/dL (ref 6–20)
CHLORIDE: 108 mmol/L (ref 101–111)
CO2: 20 mmol/L — AB (ref 22–32)
CREATININE: 0.96 mg/dL (ref 0.61–1.24)
Calcium: 8.7 mg/dL — ABNORMAL LOW (ref 8.9–10.3)
GFR calc non Af Amer: >60 mL/min (ref >60)
Glucose, Bld: 174 mg/dL — ABNORMAL HIGH (ref 65–99)
Potassium: 3.8 mmol/L (ref 3.5–5.1)
Sodium: 137 mmol/L (ref 135–145)

## 2017-07-15 LAB — GLUCOSE, CAPILLARY
GLUCOSE-CAPILLARY: 173 mg/dL — AB (ref 65–99)
GLUCOSE-CAPILLARY: 185 mg/dL — AB (ref 65–99)
Glucose-Capillary: 173 mg/dL — ABNORMAL HIGH (ref 65–99)

## 2017-07-15 MED ORDER — METFORMIN HCL 1000 MG PO TABS: 1000.0000 mg | ORAL_TABLET | Freq: Two times a day (BID) | ORAL | 0 refills | Status: DC

## 2017-07-15 MED ORDER — GLIPIZIDE 5 MG PO TABS: 5.0000 mg | ORAL_TABLET | Freq: Every day | ORAL | 0 refills | Status: DC

## 2017-07-15 MED ORDER — BLOOD GLUCOSE METER KIT: PACK | 0 refills | Status: AC

## 2017-07-15 NOTE — Progress Notes (Signed)
Inpatient Diabetes Program Recommendations  AACE/ADA: New Consensus Statement on Inpatient Glycemic Control (2015)  Target Ranges:  Prepandial:   less than 140 mg/dL      Peak postprandial:   less than 180 mg/dL (1-2 hours)      Critically ill patients:  140 - 180 mg/dL   Lab Results  Component Value Date   GLUCAP 173 (H) 07/15/2017   HGBA1C 11.4 (H) 07/13/2017   Review of Glycemic Control  Current orders for Inpatient glycemic control: Glipizide 5 mg Daily, Metformin 1,000 mg BID, Novolog Moderate Correction 0-15 units Q4 hours  Inpatient Diabetes Program Recommendations:    Glucose levels look good this am, however patient did receive Lantus 15 units yesterday morning and dose has lasted 24 hours. Patients glucose may increase trends today without Lantus on dose on board. Patient still requiring at least 3 units of Novolog at each administration dose time on oral agents as well.  Consider increasing Glipizide to BID dosing. Also consider the addition to Jardiance 25 mg Daily as mentioned in previous note with cardiovascular benefits as well.  Thanks,  Tama Headings RN, MSN, [facility] Inpatient Diabetes Coordinator Team Pager [phone] (8a-5p)

## 2017-07-15 NOTE — Care Management Note (Signed)
Case Management Note  Patient Details  Name: Andrew Burgess MRN: [id number] Date of Birth: Feb 18, 1971  Subjective/Objective:       DKA              Action/Plan: Transition to home.   Expected Discharge Date:  07/15/17               Expected Discharge Plan:  Home/Self Care  In-House Referral:     Discharge planning Services  CM Consult  Post Acute Care Choice:   N/A Choice offered to:   N/A  DME Arranged:   N/A  DME Agency:   N/A  HH Arranged:    N/A HH Agency:   N/A  Status of Service:  Completed, signed off  If discussed at [place] of Stay Meetings, dates discussed:    Additional Comments:    Sharin Mons, RN 07/15/2017, 12:31 PM

## 2017-07-15 NOTE — Progress Notes (Signed)
RN pulled up education videos for pt to watch regarding diabetes. Will continue to monitor pt & answer any questions he has about them. Hoover Brunette, RN

## 2017-07-15 NOTE — Discharge Summary (Signed)
Physician Discharge Summary  RETT STEHLIK YTK:[id number] DOB: April 06, 1971 DOA: 07/12/2017  PCP: Abner Greenspan, MD  Admit date: 07/12/2017 Discharge date: 07/15/2017  Admitted From:  Home Disposition:  Home  Recommendations for Outpatient Follow-up:  1. Follow up with PCP in 1-week 2. Patient placed on glipizide 3. Metformin has been increased to 1000 mg bid 4. Patient had diabetic teaching, and glucometer prescribed to check capillary glucose before meals and at night. 5. Instructed to keel a log of his capillary glucose measurements  Home Health: No Equipment/Devices: Glucometer  Discharge Condition: Stable CODE STATUS: full  Diet recommendation: Heart healthy and diabetic prudent  Brief/Interim Summary: 47 year old male who presented with dizziness. He does have a significant past medical history of type 2 diabetes mellitus, coronary artery disease status post stent August 2017. Patient complained of polydipsia and polyuria along with 13 pound weight loss. He was found hyperglycemic by his primary care provider, and he was sent to the hospital for further evaluation. On initial physical examination blood pressure 133/77, heart rate 66, respiratory 16, temperature 98.4, oxygen saturation 98%. Moist mucous membranes, lungs clear to auscultation bilaterally, heart S1-S2 present rhythmic, abdomen soft nontender, no lower extremity edema. Sodium 127, potassium 4.4, chloride 95, bicarbonate 22, glucose 701, BUN 25, creatinine 0.72, anion gap 10, white count 7.9, hemoglobin 12.8, hematocrit 37.0, platelets 224, urinalysis negative for infection, then 500 glucose  Patient was admitted to the hospital working diagnosis of hyperglycemic hyperosmolar nonketotic state.   1. Hyperosmolar nonketotic state. Patient was admitted to the medical ward, patient was placed on continuous infusion of intravenous insulin, with improvement of his symptoms along with serum glucose. He was successfully  transitioned to subcutaneous insulin with good toleration. Patient expressed that he would prefer to use oral agents instead of insulin to control his hyperglycemia. 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 Patient was placed on glipizide and high dose of metformin with good toleration, his fasting serum glucose at discharge 174.   2. Type 2 diabetes mellitus. His acute decompensation has been attributed to his extreme noncompliance with a carbohydrate restricted diet. Patient is committed to follow a diabetic regimen, he has received diabetic teaching, glucometer has been prescribed. Patient will continue with oral agents for now. He was advised that insulin is  The preferred agent, for uncontrol hyperglycemia, he understands risk of uncontrolled diabetes mellitus, but prefers to use oral agents.  3. Acute kidney injury. Patient received IV fluids with improvement of his kidney function, his discharge creatinine 0.96. Discharge potassium 3.8, serum bicarbonate 20, anion gap 9.   4. Coronary artery disease. She remained chest pain-free, will continue aspirin, ticagrelor, atorvastatin, fenofibrate, and metoprolol.  5. Hypertension. Continue blood pressure control with metoprolol.   Discharge Diagnoses:  Principal Problem:   Hyperosmolar non-ketotic state in patient with type 2 diabetes mellitus ([place]) Active Problems:   Status post coronary artery stent placement   CAD (coronary artery disease)   Diabetes mellitus ([place])   AKI (acute kidney injury) ([place])    Discharge Instructions   Allergies as of 07/15/2017   No Known Allergies     Medication List    TAKE these medications   ASPIRIN PO Take 81 mg by mouth daily.   atorvastatin 80 MG tablet Commonly known as:  LIPITOR TAKE 1 TABLET (80 MG TOTAL) BY MOUTH DAILY AT 6 PM.   blood glucose meter kit and supplies Dispense based on  patient and insurance preference. Use up to four  times daily as directed. (FOR ICD-10 E10.9, E11.9).   BRILINTA 90 MG Tabs tablet Generic drug:  ticagrelor TAKE 1 TABLET (90 MG TOTAL) BY MOUTH 2 (TWO) TIMES DAILY.   fenofibrate 160 MG tablet TAKE 1 TABLET BY MOUTH DAILY   glipiZIDE 5 MG tablet Commonly known as:  GLUCOTROL Take 1 tablet (5 mg total) by mouth daily before breakfast. Start taking on:  07/16/2017   metFORMIN 1000 MG tablet Commonly known as:  GLUCOPHAGE Take 1 tablet (1,000 mg total) by mouth 2 (two) times daily with a meal. What changed:  medication strength   metoprolol tartrate 25 MG tablet Commonly known as:  LOPRESSOR TAKE 1 TABLET BY MOUTH 2 TIMES DAILY   nitroGLYCERIN 0.4 MG SL tablet Commonly known as:  NITROSTAT Place 1 tablet (0.4 mg total) under the tongue every 5 (five) minutes as needed for chest pain (3 DOSES MAX).   omega-3 acid ethyl esters 1 g capsule Commonly known as:  LOVAZA TAKE 1 CAPSULE BY MOUTH 2 TIMES DAILY       No Known Allergies  Consultations:     Procedures/Studies:  No results found.    Subjective: Patient feeling better, no nausea or vomiting, no chest pain or dyspnea. No abdominal pain.  Discharge Exam: Vitals:   07/15/17 0623 07/15/17 0932  BP: 111/74 130/75  Pulse: 69 80  Resp: 17   Temp: 98.3 F (36.8 C)   SpO2: 99%    Vitals:   07/14/17 1447 07/14/17 2051 07/15/17 0623 07/15/17 0932  BP: 126/79 129/74 111/74 130/75  Pulse: 64 72 69 80  Resp: 20 16 17    Temp: 98.7 F (37.1 C) (!) 97.5 F (36.4 C) 98.3 F (36.8 C)   TempSrc: Oral  Oral   SpO2: 99% 100% 99%   Weight:   105.3 kg (232 lb 3.2 oz)   Height:        General: Not in pain or dyspnea. Neurology: Awake and alert, non focal  E ENT: pallor, no icterus, oral mucosa moist Cardiovascular: No JVD. S1-S2 present, rhythmic, no gallops, rubs, or murmurs. No lower extremity edema. Pulmonary: vesicular breath sounds bilaterally, adequate air movement, no wheezing, rhonchi or  rales. Gastrointestinal. Abdomen flat, no organomegaly, non tender, no rebound or guarding Skin. No rashes Musculoskeletal: no joint deformities   The results of significant diagnostics from this hospitalization (including imaging, microbiology, ancillary and laboratory) are listed below for reference.     Microbiology: No results found for this or any previous visit (from the past 240 hour(s)).   Labs: BNP (last 3 results) No results for input(s): BNP in the last 8760 hours. Basic Metabolic Panel: Recent Labs  Lab 07/12/17 1729 07/13/17 0244 07/13/17 1330 07/14/17 0438 07/15/17 0437  NA 128* 137 136 138 137  K 4.8 3.2* 3.9 4.1 3.8  CL 96* 106 107 110 108  CO2 18* 19* 17* 21* 20*  GLUCOSE 733* 188* 235* 219* 174*  BUN 27* 23* 20 16 13   CREATININE 2.20* 1.13 1.21 1.25* 0.96  CALCIUM 9.0 8.6* 8.6* 8.3* 8.7*   Liver Function Tests: Recent Labs  Lab 07/12/17 1252  AST 46*  ALT 42  ALKPHOS 70  BILITOT 0.5  PROT 7.5  ALBUMIN 4.4   No results for input(s): LIPASE, AMYLASE in the last 168 hours. No results for input(s): AMMONIA in the last 168 hours. CBC: Recent Labs  Lab 07/12/17 1252 07/12/17 1729 07/13/17 0244  WBC 7.3  7.9 6.8  NEUTROABS 4.5  --   --   HGB 13.6 12.8* 12.1*  HCT 39.7 37.0* 34.6*  MCV 85.4 83.7 83.4  PLT 216.0 224 201   Cardiac Enzymes: Recent Labs  Lab 07/13/17 0244 07/13/17 1330  TROPONINI <0.03 <0.03   BNP: Invalid input(s): POCBNP CBG: Recent Labs  Lab 07/14/17 1710 07/14/17 2049 07/14/17 2349 07/15/17 0405 07/15/17 0748  GLUCAP 152* 186* 114* 173* 173*   D-Dimer No results for input(s): DDIMER in the last 72 hours. Hgb A1c Recent Labs    07/12/17 1402 07/13/17 0244  HGBA1C 12.4* 11.4*   Lipid Profile No results for input(s): CHOL, HDL, LDLCALC, TRIG, CHOLHDL, LDLDIRECT in the last 72 hours. Thyroid function studies No results for input(s): TSH, T4TOTAL, T3FREE, THYROIDAB in the last 72 hours.  Invalid input(s):  FREET3 Anemia work up No results for input(s): VITAMINB12, FOLATE, FERRITIN, TIBC, IRON, RETICCTPCT in the last 72 hours. Urinalysis    Component Value Date/Time   COLORURINE COLORLESS (A) 07/12/2017 1722   APPEARANCEUR CLEAR 07/12/2017 1722   LABSPEC 1.022 07/12/2017 1722   PHURINE 6.0 07/12/2017 1722   GLUCOSEU >=500 (A) 07/12/2017 1722   HGBUR NEGATIVE 07/12/2017 1722   BILIRUBINUR NEGATIVE 07/12/2017 1722   KETONESUR NEGATIVE 07/12/2017 1722   PROTEINUR NEGATIVE 07/12/2017 1722   NITRITE NEGATIVE 07/12/2017 1722   LEUKOCYTESUR NEGATIVE 07/12/2017 1722   Sepsis Labs Invalid input(s): PROCALCITONIN,  WBC,  LACTICIDVEN Microbiology No results found for this or any previous visit (from the past 240 hour(s)).   Time coordinating discharge: 45 minutes  SIGNED:   Tawni Millers, MD  [facility] 07/15/2017, 9:45 AM Pager [phone]  If 7PM-7AM, please contact night-coverage [email or web address] Password [email or web address]

## 2017-07-16 ENCOUNTER — Telehealth: Payer: Self-pay

## 2017-07-16 ENCOUNTER — Encounter: Payer: Self-pay | Admitting: Internal Medicine

## 2017-07-16 LAB — HM DIABETES EYE EXAM

## 2017-07-16 NOTE — Telephone Encounter (Signed)
Left message for patient to call me back to set up hospital follow up appointment and perform TCM call.  (okay per DPR).

## 2017-07-26 ENCOUNTER — Encounter: Payer: 59 | Admitting: Family Medicine

## 2017-07-26 ENCOUNTER — Ambulatory Visit (INDEPENDENT_AMBULATORY_CARE_PROVIDER_SITE_OTHER): Payer: 59 | Admitting: Family Medicine

## 2017-07-26 ENCOUNTER — Encounter: Payer: Self-pay | Admitting: Family Medicine

## 2017-07-26 VITALS — BP 110/60 | HR 80 | Temp 98.9°F | Resp 16 | Ht 71.5 in | Wt 228.5 lb

## 2017-07-26 DIAGNOSIS — E781 Pure hyperglyceridemia: Secondary | ICD-10-CM

## 2017-07-26 DIAGNOSIS — E785 Hyperlipidemia, unspecified: Secondary | ICD-10-CM | POA: Diagnosis not present

## 2017-07-26 DIAGNOSIS — Z6830 Body mass index (BMI) 30.0-30.9, adult: Secondary | ICD-10-CM | POA: Diagnosis not present

## 2017-07-26 DIAGNOSIS — E6609 Other obesity due to excess calories: Secondary | ICD-10-CM | POA: Diagnosis not present

## 2017-07-26 DIAGNOSIS — E11319 Type 2 diabetes mellitus with unspecified diabetic retinopathy without macular edema: Secondary | ICD-10-CM

## 2017-07-26 DIAGNOSIS — Z0000 Encounter for general adult medical examination without abnormal findings: Secondary | ICD-10-CM

## 2017-07-26 DIAGNOSIS — D573 Sickle-cell trait: Secondary | ICD-10-CM

## 2017-07-26 DIAGNOSIS — Z23 Encounter for immunization: Secondary | ICD-10-CM

## 2017-07-26 DIAGNOSIS — I2510 Atherosclerotic heart disease of native coronary artery without angina pectoris: Secondary | ICD-10-CM | POA: Diagnosis not present

## 2017-07-26 LAB — LIPID PANEL
CHOLESTEROL: 77 mg/dL (ref 0–200)
HDL: 21.2 mg/dL — ABNORMAL LOW (ref >39.00)
LDL CALC: 20 mg/dL (ref 0–99)
NonHDL: 55.72
TRIGLYCERIDES: 179 mg/dL — AB (ref 0.0–149.0)
Total CHOL/HDL Ratio: 4
VLDL: 35.8 mg/dL (ref 0.0–40.0)

## 2017-07-26 LAB — TSH: TSH: 2.75 u[IU]/mL (ref 0.35–4.50)

## 2017-07-26 MED ORDER — METFORMIN HCL 1000 MG PO TABS: 1000.0000 mg | ORAL_TABLET | Freq: Two times a day (BID) | ORAL | 11 refills | Status: DC

## 2017-07-26 MED ORDER — GLIPIZIDE 5 MG PO TABS: 5.0000 mg | ORAL_TABLET | Freq: Every day | ORAL | 11 refills | Status: DC

## 2017-07-26 MED ORDER — OMEGA-3-ACID ETHYL ESTERS 1 G PO CAPS: ORAL_CAPSULE | ORAL | 11 refills | Status: DC

## 2017-07-26 MED ORDER — FENOFIBRATE 160 MG PO TABS: 160.0000 mg | ORAL_TABLET | Freq: Every day | ORAL | 11 refills | Status: DC

## 2017-07-26 NOTE — Assessment & Plan Note (Signed)
On gemfibrozil Diet better now  Lab today Hx of DM and CAD

## 2017-07-26 NOTE — Patient Instructions (Addendum)
Ask your eye doctor to send a copy of report please   For cholesterol  Avoid red meat/ fried foods/ egg yolks/ fatty breakfast meats/ butter, cheese and high fat dairy/ and shellfish    For your diabetes  Try to get most of your carbohydrates from produce (with the exception of white potatoes)  Eat less bread/pasta/rice/snack foods/cereals/sweets and other items from the middle of the grocery store (processed carbs)   Check out the book "sugar busters" - a good introduction to eating low glycemic  Ultimate goal is A1C under 7  Your last one was 11.4 (up from 6.7 the time before)    Labs today for cholesterol and thyroid   Follow up in 3 months with labs prior for diabetes   Pneumonia vaccine today

## 2017-07-26 NOTE — Assessment & Plan Note (Signed)
No clinical change on current medicines Exercise tolerance has improved

## 2017-07-26 NOTE — Assessment & Plan Note (Signed)
Discussed how this problem influences overall health and the risks it imposes  Reviewed plan for weight loss with lower calorie diet (via better food choices and also portion control or program like weight watchers) and exercise building up to or more than 30 minutes 5 days per week including some aerobic activity   Doing much better now

## 2017-07-26 NOTE — Progress Notes (Signed)
Subjective:    Patient ID: Andrew Burgess, male    DOB: 11-Apr-1971, 47 y.o.   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   Pneumonia vaccine -would like today   Eye exam recent-another one today   Tetanus shot 6/18 Flu shot 10/18  He has had acute MI in 2017  CAD On metoprolol and brillenta  Has been better/stable Feels like heart is stronger- more stamina    BP Readings from Last 3 Encounters:  07/26/17 110/60  07/15/17 130/75  07/12/17 125/77   Pulse Readings from Last 3 Encounters:  07/26/17 80  07/15/17 80  07/12/17 77     DM2 Lab Results  Component Value Date   HGBA1C 11.4 (H) 07/13/2017   This is up from 6.7 in June  Was drinking a lot of lemonade and fruit punch  Is back on track now  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  Blood sugars are a lot better at home (one teens)  Less thirsty and urinating less now- feels better  Is home daily so he can pack lunch now  Walking 4-5 times per week 30-60 minutes (feels a whole lot better)  Lab Results  Component Value Date   MICROALBUR 2.4 (H) 11/23/2016      Sees eye doctor today at 1:30  Still struggling with vision  No retinopathy as of last check  Wearing bifocals- trouble seeing close up    Hyperlipidemia Lab Results  Component Value Date   CHOL 96 11/16/2016   HDL 22.80 (L) 11/16/2016   LDLCALC 46 05/22/2016   LDLDIRECT 34.0 11/16/2016   TRIG 224.0 (H) 11/16/2016   CHOLHDL 4 11/16/2016   Atorvastatin 80 Fenofibrate 160 lovaza 1 g   Lab Results  Component Value Date   ALT 42 07/12/2017   AST 46 (H) 07/12/2017   ALKPHOS 70 07/12/2017   BILITOT 0.5 07/12/2017   Lab Results  Component Value Date   TSH 2.44 11/16/2016    Lab Results  Component Value Date   WBC 6.8 07/13/2017   HGB 12.1 (L) 07/13/2017   HCT 34.6 (L) 07/13/2017   MCV 83.4 07/13/2017   PLT 201 07/13/2017   Hx of [place] trait-no complications  Avoiding red meat  Eating baked chicken and salad   No new family hx of cancer  No urinary changes at all No nocturia  No colon cancer in family   Patient Active Problem List   Diagnosis Date Noted  . Sickle cell trait ([place]) 07/26/2017  . Hyperosmolar non-ketotic state in patient with type 2 diabetes mellitus ([place]) 07/12/2017  . Routine general medical examination at a health care facility 11/08/2016  . Low testosterone in male 06/02/2016  . Low libido 05/22/2016  . CAD (coronary artery disease)   . Hyperlipidemia   .  Diabetes mellitus ([place])   . Status post coronary artery stent placement   . Acute MI inferior lateral first episode care ([place]) 01/18/2016  . Muscle weakness (generalized) 08/29/2012  . Stress reaction 01/19/2011  . Screen for STD (sexually transmitted disease) 01/12/2011  . Obesity 11/14/2007  . ALLERGIC RHINITIS 11/11/2007  . Hypertriglyceridemia 03/04/2007   Past Medical History:  Diagnosis Date  . Allergy   . CAD (coronary artery disease)    a. 01/2016: Peak troponin 23.15. Left heart cath on 01/21/16 showed 100% occluded mid-dist RCA occlusion with heavy thrombus burden s/p DES. Rx asp thrombectomy and post procedure Aggrastat. Re look 01/21/16 revealed persistent thrombus in prox aspect of stent as well as a distal lesion both of which were intervened on successfully with placement of 2 additional DES.  . Diabetes mellitus ([place])     . Hyperglycemia   . Hyperlipidemia    triglyceriedes   . Hypertriglyceridemia   . Obesity   . Sickle cell trait [facility])    Past Surgical History:  Procedure Laterality Date  . ANKLE SURGERY    . CARDIAC CATHETERIZATION N/A 01/18/2016   Procedure: Left Heart Cath and Coronary Angiography;  Surgeon: Jettie Booze, MD;  Location: [place] [facility];  Service: Cardiovascular;  Laterality: N/A;  . CARDIAC CATHETERIZATION N/A 01/18/2016   Procedure: Coronary Stent Intervention;  Surgeon: Jettie Booze, MD;  Location: [place] [facility];  Service: Cardiovascular;  Laterality: N/A;  MID RCA  . CARDIAC CATHETERIZATION N/A 01/21/2016   Procedure: Left Heart Cath and Coronary Angiography;  Surgeon: Troy Sine, MD;  Location: [place] [facility];  Service: Cardiovascular;  Laterality: N/A;  . CARDIAC CATHETERIZATION N/A 01/21/2016   Procedure: Coronary Stent Intervention;  Surgeon: Troy Sine, MD;  Location: [place] [facility];  Service: Cardiovascular;  Laterality: N/A;   Social History   Tobacco Use  . Smoking status: Former Smoker    Types: Cigars    Last attempt to quit: 01/18/2012    Years since quitting: 5.5  . Smokeless tobacco: Never Used  Substance Use Topics  . Alcohol use: Yes    Alcohol/week: 0.0 oz    Comment: rare  . Drug use: No   Family History  Problem Relation Age of Onset  . Coronary artery disease Mother   . Heart attack Mother   . Hyperlipidemia Mother   . Alcohol abuse Father   . Hyperlipidemia Father   . Hypertension Father   . Hypertension Brother   . Cancer Maternal Grandmother        brain   No Known Allergies Current Outpatient Medications on File Prior to Visit  Medication Sig Dispense Refill  . ASPIRIN PO Take 81 mg by mouth daily.     [name] atorvastatin (LIPITOR) 80 MG tablet TAKE 1 TABLET (80 MG TOTAL) BY MOUTH DAILY AT 6 PM. 90 tablet 3  . blood glucose meter kit and supplies Dispense based on patient and insurance preference. Use up to four  times daily as directed. (FOR ICD-10 E10.9, E11.9). 1 each 0  . BRILINTA 90 MG TABS tablet TAKE 1 TABLET (90 MG TOTAL) BY MOUTH 2 (TWO) TIMES DAILY. 180 tablet 1  . metoprolol tartrate (LOPRESSOR) 25 MG tablet TAKE 1 TABLET BY MOUTH 2 TIMES DAILY 60 tablet 6  . nitroGLYCERIN (NITROSTAT) 0.4 MG SL tablet Place 1 tablet (0.4 mg total) under the tongue every 5 (five) minutes as needed for chest pain (3 DOSES MAX). 25 tablet 2  No current facility-administered medications on file prior to visit.     Review of Systems  Constitutional: Negative for activity change, appetite change, fatigue, fever and unexpected weight change.  HENT: Negative for congestion, rhinorrhea, sore throat and trouble swallowing.   Eyes: Negative for pain, redness, itching and visual disturbance.  Respiratory: Negative for cough, chest tightness, shortness of breath and wheezing.   Cardiovascular: Negative for chest pain and palpitations.  Gastrointestinal: Negative for abdominal pain, blood in stool, constipation, diarrhea and nausea.  Endocrine: Negative for cold intolerance, heat intolerance, polydipsia and polyuria.  Genitourinary: Negative for difficulty urinating, dysuria, frequency and urgency.  Musculoskeletal: Negative for arthralgias, joint swelling and myalgias.  Skin: Negative for pallor and rash.  Neurological: Negative for dizziness, tremors, weakness, numbness and headaches.  Hematological: Negative for adenopathy. Does not bruise/bleed easily.  Psychiatric/Behavioral: Negative for decreased concentration and dysphoric mood. The patient is not nervous/anxious.        Objective:   Physical Exam  Constitutional: He appears well-developed and well-nourished. No distress.  obese and well appearing   HENT:  Head: Normocephalic and atraumatic.  Right Ear: External ear normal.  Left Ear: External ear normal.  Nose: Nose normal.  Mouth/Throat: Oropharynx is clear and moist.  Eyes: Conjunctivae and EOM are  normal. Pupils are equal, round, and reactive to light. Right eye exhibits no discharge. Left eye exhibits no discharge. No scleral icterus.  Neck: Normal range of motion. Neck supple. No JVD present. Carotid bruit is not present. No thyromegaly present.  Cardiovascular: Normal rate, regular rhythm, normal heart sounds and intact distal pulses. Exam reveals no gallop.  Pulmonary/Chest: Effort normal and breath sounds normal. No respiratory distress. He has no wheezes. He exhibits no tenderness.  Abdominal: Soft. Bowel sounds are normal. He exhibits no distension, no abdominal bruit and no mass. There is no tenderness.  Musculoskeletal: He exhibits no edema or tenderness.  Lymphadenopathy:    He has no cervical adenopathy.  Neurological: He is alert. He has normal reflexes. No cranial nerve deficit. He exhibits normal muscle tone. Coordination normal.  Skin: Skin is warm and dry. No rash noted. No erythema. No pallor.  Solar lentigines diffusely  Stable nevus (flat) on R index finger (lifelong) w/o change   Psychiatric: He has a normal mood and affect.  Mood is good          Assessment & Plan:   Problem List Items Addressed This Visit      Cardiovascular and Mediastinum   CAD (coronary artery disease)    No clinical change on current medicines Exercise tolerance has improved       Relevant Medications   fenofibrate 160 MG tablet   omega-3 acid ethyl esters (LOVAZA) 1 g capsule     Endocrine   Diabetes mellitus ([place])    Lab Results  Component Value Date   HGBA1C 11.4 (H) 07/13/2017   This is up severely from June (6.7) due to poor diet and sugary drinks Reviewed hospital records, lab results and studies in detail  Much imp with inc metformin and glipizide No hypoglycemia  Eating better and exercising  Working on wt loss  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  Disc eye and foot care For eye exam today  F/u 3 mo       Relevant Medications   glipiZIDE  (GLUCOTROL) 5 MG tablet   metFORMIN (GLUCOPHAGE) 1000 MG tablet     Other   Hyperlipidemia    On  atorvastatin and gemfibrozil and lovaza  Lipid panel today  Hx of CAD      Relevant Medications   fenofibrate 160 MG tablet   omega-3 acid ethyl esters (LOVAZA) 1 g capsule   Other Relevant Orders   Lipid panel   Hypertriglyceridemia    On gemfibrozil Diet better now  Lab today Hx of DM and CAD      Relevant Medications   fenofibrate 160 MG tablet   omega-3 acid ethyl esters (LOVAZA) 1 g capsule   Obesity    Discussed how this problem influences overall health and the risks it imposes  Reviewed plan for weight loss with lower calorie diet (via better food choices and also portion control or program like weight watchers) and exercise building up to or more than 30 minutes 5 days per week including some aerobic activity   Doing much better now       Relevant Medications   glipiZIDE (GLUCOTROL) 5 MG tablet   metFORMIN (GLUCOPHAGE) 1000 MG tablet   Routine general medical examination at a health care facility - Primary    Reviewed health habits including diet and exercise and skin cancer prevention Reviewed appropriate screening tests for age  Also reviewed health mt list, fam hx and immunization status , as well as social and family history   See HPI Reviewed hospital records, lab results and studies in detail  -recent for hyperglycemia  Commended on better health habits PNA 23 vaccine today  Eye exam today  TSH/ lipid panel today      Relevant Orders   TSH   Sickle cell trait (H[facility])    Cbc stable No symptoms

## 2017-07-26 NOTE — Assessment & Plan Note (Signed)
Cbc stable No symptoms

## 2017-07-26 NOTE — Assessment & Plan Note (Signed)
On atorvastatin and gemfibrozil and lovaza  Lipid panel today  Hx of CAD

## 2017-07-26 NOTE — Assessment & Plan Note (Signed)
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

## 2017-07-26 NOTE — Assessment & Plan Note (Signed)
Reviewed health habits including diet and exercise and skin cancer prevention Reviewed appropriate screening tests for age  Also reviewed health mt list, fam hx and immunization status , as well as social and family history   See HPI Reviewed hospital records, lab results and studies in detail  -recent for hyperglycemia  Commended on better health habits PNA 23 vaccine today  Eye exam today  TSH/ lipid panel today

## 2017-07-27 ENCOUNTER — Encounter: Payer: Self-pay | Admitting: *Deleted

## 2017-08-23 ENCOUNTER — Telehealth: Payer: Self-pay | Admitting: Family Medicine

## 2017-08-23 NOTE — Telephone Encounter (Signed)
Copied from [place]. Topic: General - Other >> Aug 23, 2017 12:20 PM Cecelia Byars, NT wrote: Reason for CRM: Patient called and said he needs a prescription for more test stripes for his meter kit one  touch mini kit  [id number] [phone](Phone) [phone](Fax Please call him at [phone] any questions , he says he has only 5 test strips left

## 2017-08-23 NOTE — Telephone Encounter (Signed)
Pt requesting prescription for test strips and meter kit ( one touch mini kit).Prescriptions not previously filled by Dr. Tower   LOV: 07/26/17  Dr[facility] [address]   

## 2017-08-23 NOTE — Telephone Encounter (Signed)
Px is in IN box

## 2017-08-23 NOTE — Telephone Encounter (Signed)
Rx faxed

## 2017-10-08 ENCOUNTER — Other Ambulatory Visit: Payer: Self-pay | Admitting: Cardiology

## 2017-10-17 ENCOUNTER — Telehealth: Payer: Self-pay | Admitting: Family Medicine

## 2017-10-17 DIAGNOSIS — E11319 Type 2 diabetes mellitus with unspecified diabetic retinopathy without macular edema: Secondary | ICD-10-CM

## 2017-10-17 DIAGNOSIS — E785 Hyperlipidemia, unspecified: Secondary | ICD-10-CM

## 2017-10-17 DIAGNOSIS — E781 Pure hyperglyceridemia: Secondary | ICD-10-CM

## 2017-10-17 NOTE — Telephone Encounter (Signed)
-----   Message from Lendon Collar, RT sent at 10/12/2017  2:56 PM EDT ----- Regarding: Lab orders for Monday May 6th Please enter lab orders for Monday May 6th. Thanks-Lauren

## 2017-10-18 ENCOUNTER — Other Ambulatory Visit (INDEPENDENT_AMBULATORY_CARE_PROVIDER_SITE_OTHER): Payer: 59

## 2017-10-18 DIAGNOSIS — E781 Pure hyperglyceridemia: Secondary | ICD-10-CM | POA: Diagnosis not present

## 2017-10-18 DIAGNOSIS — E785 Hyperlipidemia, unspecified: Secondary | ICD-10-CM | POA: Diagnosis not present

## 2017-10-18 DIAGNOSIS — E11319 Type 2 diabetes mellitus with unspecified diabetic retinopathy without macular edema: Secondary | ICD-10-CM | POA: Diagnosis not present

## 2017-10-18 LAB — LDL CHOLESTEROL, DIRECT: LDL DIRECT: 42 mg/dL

## 2017-10-18 LAB — COMPREHENSIVE METABOLIC PANEL
ALBUMIN: 4.3 g/dL (ref 3.5–5.2)
ALK PHOS: 44 U/L (ref 39–117)
ALT: 28 U/L (ref 0–53)
AST: 18 U/L (ref 0–37)
BILIRUBIN TOTAL: 0.3 mg/dL (ref 0.2–1.2)
BUN: 16 mg/dL (ref 6–23)
CALCIUM: 9.1 mg/dL (ref 8.4–10.5)
CO2: 25 mEq/L (ref 19–32)
Chloride: 108 mEq/L (ref 96–112)
Creatinine, Ser: 1.24 mg/dL (ref 0.40–1.50)
GFR: 79.78 mL/min (ref >60.00)
Glucose, Bld: 123 mg/dL — ABNORMAL HIGH (ref 70–99)
Potassium: 4.1 mEq/L (ref 3.5–5.1)
Sodium: 141 mEq/L (ref 135–145)
Total Protein: 7.2 g/dL (ref 6.0–8.3)

## 2017-10-18 LAB — LIPID PANEL
Cholesterol: 97 mg/dL (ref 0–200)
HDL: 22.9 mg/dL — AB (ref >39.00)
NONHDL: 73.87
TRIGLYCERIDES: 214 mg/dL — AB (ref 0.0–149.0)
Total CHOL/HDL Ratio: 4
VLDL: 42.8 mg/dL — ABNORMAL HIGH (ref 0.0–40.0)

## 2017-10-18 LAB — HEMOGLOBIN A1C: Hgb A1c MFr Bld: 5.2 % (ref 4.6–6.5)

## 2017-10-25 ENCOUNTER — Ambulatory Visit: Payer: 59 | Admitting: Family Medicine

## 2017-10-25 ENCOUNTER — Encounter: Payer: Self-pay | Admitting: Family Medicine

## 2017-10-25 VITALS — BP 118/62 | HR 65 | Temp 98.6°F | Ht 71.5 in | Wt 230.5 lb

## 2017-10-25 DIAGNOSIS — E11319 Type 2 diabetes mellitus with unspecified diabetic retinopathy without macular edema: Secondary | ICD-10-CM

## 2017-10-25 DIAGNOSIS — E781 Pure hyperglyceridemia: Secondary | ICD-10-CM

## 2017-10-25 DIAGNOSIS — E785 Hyperlipidemia, unspecified: Secondary | ICD-10-CM

## 2017-10-25 DIAGNOSIS — E669 Obesity, unspecified: Secondary | ICD-10-CM

## 2017-10-25 NOTE — Progress Notes (Signed)
Subjective:    Patient ID: Andrew Burgess, male    DOB: [birth year], 47 y.o.   MRN: [id number]  HPI  Here for f/u of chronic medical problems   Wt Readings from Last 3 Encounters:  10/25/17 230 lb 8 oz (104.6 kg)  07/26/17 228 lb 8 oz (103.6 kg)  07/15/17 232 lb 3.2 oz (105.3 kg)  wt is stable  Walking 30-60 minutes per day on a trail  31.70 kg/m   BP Readings from Last 3 Encounters:  10/25/17 118/62  07/26/17 110/60  07/15/17 130/75   Pulse Readings from Last 3 Encounters:  10/25/17 65  07/26/17 80  07/15/17 80    DM2 Lab Results  Component Value Date   HGBA1C 5.2 10/18/2017  lowest 80s-90s  Highest 165  Much much improved  Taking metformin and glipizide  Eye exam 2/19 Not on ace  Still eating really well / likes tuna and relish and salads   Lab Results  Component Value Date   MICROALBUR 2.4 (H) 11/23/2016    Hyperlipidemia Lab Results  Component Value Date   CHOL 97 10/18/2017   CHOL 77 07/26/2017   CHOL 96 11/16/2016   Lab Results  Component Value Date   HDL 22.90 (L) 10/18/2017   HDL 21.20 (L) 07/26/2017   HDL 22.80 (L) 11/16/2016   Lab Results  Component Value Date   LDLCALC 20 07/26/2017   LDLCALC 46 05/22/2016   LDLCALC UNABLE TO CALCULATE IF TRIGLYCERIDE OVER 400 mg/dL 01/21/2016   Lab Results  Component Value Date   TRIG 214.0 (H) 10/18/2017   TRIG 179.0 (H) 07/26/2017   TRIG 224.0 (H) 11/16/2016   Lab Results  Component Value Date   CHOLHDL 4 10/18/2017   CHOLHDL 4 07/26/2017   CHOLHDL 4 11/16/2016   Lab Results  Component Value Date   LDLDIRECT 42.0 10/18/2017   LDLDIRECT 34.0 11/16/2016   LDLDIRECT 38.0 03/10/2016   Takes atorvastatin 80 and fenofibrate 160 and lofibra 1 g Hx of CAD Diet is good also   Patient Active Problem List   Diagnosis Date Noted  . Obesity (BMI 30-39.9) 10/25/2017  . Sickle cell trait ([place]) 07/26/2017  . Hyperosmolar non-ketotic state in patient with type 2 diabetes mellitus ([place]) 07/12/2017   . Routine general medical examination at a health care facility 11/08/2016  . Low testosterone in male 06/02/2016  . Low libido 05/22/2016  . CAD (coronary artery disease)   . Hyperlipidemia   . Diabetes mellitus ([place])   . Status post coronary artery stent placement   . Acute MI inferior lateral first episode care ([place]) 01/18/2016  . Screen for STD (sexually transmitted disease) 01/12/2011  . Obesity 11/14/2007  . ALLERGIC RHINITIS 11/11/2007  . Hypertriglyceridemia 03/04/2007   Past Medical History:  Diagnosis Date  . Allergy   . CAD (coronary artery disease)    a. 01/2016: Peak troponin 23.15. Left heart cath on 01/21/16 showed 100% occluded mid-dist RCA occlusion with heavy thrombus burden s/p DES. Rx asp thrombectomy and post procedure Aggrastat. Re look 01/21/16 revealed persistent thrombus in prox aspect of stent as well as a distal lesion both of which were intervened on successfully with placement of 2 additional DES.  . Diabetes mellitus ([place])   . Hyperglycemia   . Hyperlipidemia    triglyceriedes   . Hypertriglyceridemia   . Obesity   . Sickle cell trait [facility])    Past Surgical History:  Procedure Laterality Date  . ANKLE SURGERY    .  CARDIAC CATHETERIZATION N/A 01/18/2016   Procedure: Left Heart Cath and Coronary Angiography;  Surgeon: Jettie Booze, MD;  Location: [facility];  Service: Cardiovascular;  Laterality: N/A;  . CARDIAC CATHETERIZATION N/A 01/18/2016   Procedure: Coronary Stent Intervention;  Surgeon: Jettie Booze, MD;  Location: [facility];  Service: Cardiovascular;  Laterality: N/A;  MID RCA  . CARDIAC CATHETERIZATION N/A 01/21/2016   Procedure: Left Heart Cath and Coronary Angiography;  Surgeon: Troy Sine, MD;  Location: [facility];  Service: Cardiovascular;  Laterality: N/A;  . CARDIAC CATHETERIZATION N/A 01/21/2016   Procedure: Coronary Stent Intervention;  Surgeon: Troy Sine, MD;  Location: [facility];   Service: Cardiovascular;  Laterality: N/A;   Social History   Tobacco Use  . Smoking status: Former Smoker    Types: Cigars    Last attempt to quit: 01/18/2012    Years since quitting: 5.7  . Smokeless tobacco: Never Used  Substance Use Topics  . Alcohol use: Yes    Alcohol/week: 0.0 oz    Comment: rare  . Drug use: No   Family History  Problem Relation Age of Onset  . Coronary artery disease Mother   . Heart attack Mother   . Hyperlipidemia Mother   . Alcohol abuse Father   . Hyperlipidemia Father   . Hypertension Father   . Hypertension Brother   . Cancer Maternal Grandmother        brain   No Known Allergies Current Outpatient Medications on File Prior to Visit  Medication Sig Dispense Refill  . ASPIRIN PO Take 81 mg by mouth daily.     [name] atorvastatin (LIPITOR) 80 MG tablet TAKE 1 TABLET (80 MG TOTAL) BY MOUTH DAILY AT 6 PM. 90 tablet 3  . blood glucose meter kit and supplies Dispense based on patient and insurance preference. Use up to four times daily as directed. (FOR ICD-10 E10.9, E11.9). 1 each 0  . BRILINTA 90 MG TABS tablet TAKE 1 TABLET (90 MG TOTAL) BY MOUTH 2 (TWO) TIMES DAILY. 180 tablet 1  . fenofibrate 160 MG tablet Take 1 tablet (160 mg total) by mouth daily. 30 tablet 11  . metoprolol tartrate (LOPRESSOR) 25 MG tablet TAKE 1 TABLET BY MOUTH 2 TIMES DAILY 60 tablet 6  . nitroGLYCERIN (NITROSTAT) 0.4 MG SL tablet Place 1 tablet (0.4 mg total) under the tongue every 5 (five) minutes as needed for chest pain (3 DOSES MAX). 25 tablet 2  . omega-3 acid ethyl esters (LOVAZA) 1 g capsule TAKE 1 CAPSULE BY MOUTH 2 TIMES DAILY 60 capsule 11  . glipiZIDE (GLUCOTROL) 5 MG tablet Take 1 tablet (5 mg total) by mouth daily before breakfast. 30 tablet 11  . metFORMIN (GLUCOPHAGE) 1000 MG tablet Take 1 tablet (1,000 mg total) by mouth 2 (two) times daily with a meal. 60 tablet 11   No current facility-administered medications on file prior to visit.     Review of Systems    Constitutional: Negative for activity change, appetite change, fatigue, fever and unexpected weight change.  HENT: Negative for congestion, rhinorrhea, sore throat and trouble swallowing.   Eyes: Negative for pain, redness, itching and visual disturbance.  Respiratory: Negative for cough, chest tightness, shortness of breath and wheezing.   Cardiovascular: Negative for chest pain and palpitations.  Gastrointestinal: Negative for abdominal pain, blood in stool, constipation, diarrhea and nausea.  Endocrine: Negative for cold intolerance, heat intolerance, polydipsia and polyuria.  Genitourinary: Negative  for difficulty urinating, dysuria, frequency and urgency.  Musculoskeletal: Negative for arthralgias, joint swelling and myalgias.  Skin: Negative for pallor and rash.  Neurological: Negative for dizziness, tremors, weakness, numbness and headaches.  Hematological: Negative for adenopathy. Does not bruise/bleed easily.  Psychiatric/Behavioral: Negative for decreased concentration and dysphoric mood. The patient is not nervous/anxious.        Objective:   Physical Exam  Constitutional: He appears well-developed and well-nourished. No distress.  obese and well appearing   HENT:  Head: Normocephalic and atraumatic.  Mouth/Throat: Oropharynx is clear and moist.  Eyes: Pupils are equal, round, and reactive to light. Conjunctivae and EOM are normal.  Neck: Normal range of motion. Neck supple. No JVD present. Carotid bruit is not present. No thyromegaly present.  Cardiovascular: Normal rate, regular rhythm, normal heart sounds and intact distal pulses. Exam reveals no gallop.  Pulmonary/Chest: Effort normal and breath sounds normal. No respiratory distress. He has no wheezes. He has no rales.  No crackles  Abdominal: Soft. Bowel sounds are normal. He exhibits no distension, no abdominal bruit and no mass. There is no tenderness.  Musculoskeletal: He exhibits no edema.  Lymphadenopathy:    He  has no cervical adenopathy.  Neurological: He is alert. He has normal reflexes.  Skin: Skin is warm and dry. No rash noted.  Psychiatric: He has a normal mood and affect.          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

## 2017-10-25 NOTE — Assessment & Plan Note (Signed)
Much improved with diet/exercise/medication  Lab Results  Component Value Date   HGBA1C 5.2 10/18/2017   No hypoglycemia  If this happens we will stop glipizide Good diet/fluid intake  Good eye and foot care F/u 6 mo

## 2017-10-25 NOTE — Assessment & Plan Note (Signed)
Continue fenofibrate and lofibra Rev labs

## 2017-10-25 NOTE — Assessment & Plan Note (Signed)
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

## 2017-10-25 NOTE — Patient Instructions (Addendum)
Call and get your cardiology visit planned   Follow up with [place] in 6 months   Keep up the good work with healthy diet and exercise

## 2017-10-25 NOTE — Assessment & Plan Note (Signed)
Discussed how this problem influences overall health and the risks it imposes  Reviewed plan for weight loss with lower calorie diet (via better food choices and also portion control or program like weight watchers) and exercise building up to or more than 30 minutes 5 days per week including some aerobic activity   Doing well with health habits

## 2017-11-04 ENCOUNTER — Other Ambulatory Visit: Payer: Self-pay | Admitting: Cardiology

## 2018-02-17 ENCOUNTER — Ambulatory Visit: Payer: 59 | Admitting: Cardiology

## 2018-02-17 ENCOUNTER — Encounter: Payer: Self-pay | Admitting: Cardiology

## 2018-02-17 VITALS — BP 116/74 | HR 58 | Ht 71.5 in | Wt 236.2 lb

## 2018-02-17 DIAGNOSIS — Z8249 Family history of ischemic heart disease and other diseases of the circulatory system: Secondary | ICD-10-CM | POA: Diagnosis not present

## 2018-02-17 DIAGNOSIS — E119 Type 2 diabetes mellitus without complications: Secondary | ICD-10-CM

## 2018-02-17 DIAGNOSIS — Z9861 Coronary angioplasty status: Secondary | ICD-10-CM

## 2018-02-17 DIAGNOSIS — E785 Hyperlipidemia, unspecified: Secondary | ICD-10-CM

## 2018-02-17 DIAGNOSIS — I2510 Atherosclerotic heart disease of native coronary artery without angina pectoris: Secondary | ICD-10-CM | POA: Diagnosis not present

## 2018-02-17 NOTE — Assessment & Plan Note (Signed)
01/2016: Peak troponin 23.15. Left heart cath on 01/21/16 showed 100% occluded mid-dist RCA occlusion with heavy thrombus burden s/p DES. Rx asp thrombectomy and post procedure Aggrastat. Re look 01/21/16 revealed persistent thrombus in prox aspect of stent as well as a distal lesion both of which were intervened on successfully with placement of 2 additional DES.

## 2018-02-17 NOTE — Progress Notes (Addendum)
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 He has done well since. He has annual DOT treadmills. He exercises 3-4 days a week biking and walking, and watches his diet.  He has lost 30 lbs over the past 2 years. He has had no issues with his medications. He is in the office today for a yearly check.    Current Outpatient Medications  Medication Sig Dispense Refill  . aspirin EC 81 MG tablet Take 81 mg by mouth daily.    [name] atorvastatin (LIPITOR) 80 MG tablet TAKE 1 TABLET (80 MG TOTAL) BY MOUTH DAILY AT 6 PM. 90 tablet 3  . blood glucose meter kit and supplies Dispense based on patient and insurance preference. Use up to four times daily as directed. (FOR ICD-10 E10.9, E11.9). 1 each 0  . BRILINTA 90 MG TABS tablet TAKE 1 TABLET BY MOUTH 2 TIMES DAILY. 180 tablet 1  . fenofibrate 160 MG tablet Take 1 tablet (160 mg total) by mouth daily. 30 tablet 11  . metoprolol tartrate (LOPRESSOR) 25 MG tablet TAKE 1 TABLET BY MOUTH 2 TIMES DAILY 60 tablet 6  . nitroGLYCERIN (NITROSTAT) 0.4 MG SL tablet Place 1 tablet (0.4 mg total) under the tongue every 5 (five) minutes as needed for chest pain (3 DOSES MAX). 25 tablet 2  . omega-3 acid ethyl esters (LOVAZA) 1 g capsule TAKE 1 CAPSULE BY MOUTH 2 TIMES DAILY 60 capsule 11  . glipiZIDE (GLUCOTROL) 5 MG tablet Take 1 tablet (5 mg total) by mouth daily before breakfast. 30 tablet 11  . metFORMIN (GLUCOPHAGE) 1000 MG tablet Take 1 tablet (1,000 mg total) by mouth 2 (two) times daily with a meal. 60 tablet 11   No current facility-administered medications for this visit.     No Known Allergies  Past Medical History:  Diagnosis Date  . Allergy     . CAD (coronary artery disease)    a. 01/2016: Peak troponin 23.15. Left heart cath on 01/21/16 showed 100% occluded mid-dist RCA occlusion with heavy thrombus burden s/p DES. Rx asp thrombectomy and post procedure Aggrastat. Re look 01/21/16 revealed persistent thrombus in prox aspect of stent as well as a distal lesion both of which were intervened on successfully with placement of 2 additional DES.  . Diabetes mellitus ([place])   . Hyperglycemia   . Hyperlipidemia    triglyceriedes   . Hypertriglyceridemia   . Obesity   . Sickle cell trait ([place])     Social History   Socioeconomic History  . Marital status: Married    Spouse name: Not on file  . Number of children: 2  . Years of education: Not on file  . Highest education level: Not on file  Occupational History  . Occupation: Truck [employer] operational officer  . Financial resource strain: Not on file  . Food insecurity:    Worry: Not on file    Inability: Not on file  . Transportation needs:    Medical: Not on file    Non-medical: Not on file  Tobacco Use  . Smoking status: Former Smoker    Types: Cigars    Last attempt to quit: 01/18/2012  Years since quitting: 6.0  . Smokeless tobacco: Never Used  Substance and Sexual Activity  . Alcohol use: Yes    Alcohol/week: 0.0 standard drinks    Comment: rare  . Drug use: No  . Sexual activity: Yes  Lifestyle  . Physical activity:    Days per week: Not on file    Minutes per session: Not on file  . Stress: Not on file  Relationships  . Social connections:    Talks on phone: Not on file    Gets together: Not on file    Attends religious service: Not on file    Active member of club or organization: Not on file    Attends meetings of clubs or organizations: Not on file    Relationship status: Not on file  . Intimate partner violence:    Fear of current or ex partner: Not on file    Emotionally abused: Not on file    Physically abused: Not on file    Forced sexual activity: Not  on file  Other Topics Concern  . Not on file  Social History Narrative   Some caffeine      Family History  Problem Relation Age of Onset  . Coronary artery disease Mother   . Heart attack Mother   . Hyperlipidemia Mother   . Alcohol abuse Father   . Hyperlipidemia Father   . Hypertension Father   . Hypertension Brother   . Cancer Maternal Grandmother        brain     Review of Systems: General: negative for chills, fever, night sweats or weight changes.  Cardiovascular: negative for chest pain, dyspnea on exertion, edema, orthopnea, palpitations, paroxysmal nocturnal dyspnea or shortness of breath Dermatological: negative for rash Respiratory: negative for cough or wheezing Urologic: negative for hematuria Abdominal: negative for nausea, vomiting, diarrhea, bright red blood per rectum, melena, or hematemesis Neurologic: negative for visual changes, syncope, or dizziness All other systems reviewed and are otherwise negative except as noted above.    Blood pressure 116/74, pulse (!) 58, height 5' 11.5" (1.816 m), weight 236 lb 3.2 oz (107.1 kg).  General appearance: alert, cooperative and no distress Neck: no carotid bruit and no JVD Lungs: clear to auscultation bilaterally Heart: regular rate and rhythm Extremities: no edema Skin: Skin color, texture, turgor normal. No rashes or lesions Neurologic: Grossly normal  EKG NSR TWI 3, AVF  ASSESSMENT AND PLAN:   CAD S/P percutaneous coronary angioplasty 01/2016: Peak troponin 23.15. Left heart cath on 01/21/16 showed 100% occluded mid-dist RCA occlusion with heavy thrombus burden s/p DES. Rx asp thrombectomy and post procedure Aggrastat. Re look 01/21/16 revealed persistent thrombus in prox aspect of stent as well as a distal lesion both of which were intervened on successfully with placement of 2 additional DES.  Non-insulin dependent type 2 diabetes mellitus ([place]) On Glucophage and Glucotrol. No history of neuropathy,  retinopathy, or nephropathy  Dyslipidemia, goal LDL below 70 Check fasting lipid profile in Nov  Family history of coronary artery disease Mother and father both have a history of CAD   PLAN  Will arrange for GXT (hold Lopressor the PM before and morning of). Check lipids in Nov. I will ask Dr Stanford Breed about continuing Brilinta.  F/U one year.   Kerin Ransom PA-C 02/17/2018 9:09 AM

## 2018-02-17 NOTE — Assessment & Plan Note (Signed)
Check fasting lipid profile in Nov

## 2018-02-17 NOTE — Assessment & Plan Note (Signed)
Mother and father both have a history of CAD

## 2018-02-17 NOTE — Patient Instructions (Signed)
Medication Instructions: Your physician recommends that you continue on your current medications as directed. Please refer to the Current Medication list given to you today.  Labwork: Your physician recommends that you return for a FASTING lipid profile in November. Do not eat prior to having your blood drawn.   Testing/Procedures: Your physician has requested that you have an exercise tolerance test. For further information please visit [email or web address]. Please also follow instruction sheet, as given.  --Please hold your Metoprolol the night before and the morning of your test.   Follow-Up: Your physician wants you to follow-up in: 1 year with Dr. Stanford Breed. You will receive a reminder letter in the mail two months in advance. If you don't receive a letter, please call our office to schedule the follow-up appointment.  If you need a refill on your cardiac medications before your next appointment, please call your pharmacy.  We will call you concerning Brilinta.

## 2018-02-17 NOTE — Assessment & Plan Note (Signed)
On Glucophage and Glucotrol. No history of neuropathy, retinopathy, or nephropathy

## 2018-03-08 ENCOUNTER — Other Ambulatory Visit: Payer: Self-pay | Admitting: Cardiology

## 2018-03-10 ENCOUNTER — Other Ambulatory Visit: Payer: Self-pay | Admitting: Cardiology

## 2018-03-14 ENCOUNTER — Other Ambulatory Visit: Payer: Self-pay | Admitting: Cardiology

## 2018-03-15 ENCOUNTER — Ambulatory Visit (INDEPENDENT_AMBULATORY_CARE_PROVIDER_SITE_OTHER): Payer: 59

## 2018-03-15 DIAGNOSIS — I2510 Atherosclerotic heart disease of native coronary artery without angina pectoris: Secondary | ICD-10-CM | POA: Diagnosis not present

## 2018-03-15 LAB — EXERCISE TOLERANCE TEST
Estimated workload: 11.7 METS
Exercise duration (min): 10 min
Exercise duration (sec): 0 s
MPHR: 174 {beats}/min
Peak HR: 160 {beats}/min
Percent HR: 93 %
RPE: 17
Rest HR: 67 {beats}/min

## 2018-03-16 ENCOUNTER — Telehealth: Payer: Self-pay

## 2018-03-16 NOTE — Telephone Encounter (Signed)
-----   Message from Erlene Quan, [place] sent at 03/16/2018  2:10 PM EDT ----- T please let Mr Giuliani know I talked to Dr Sallyanne Kuster- OK to stop Brilinta- continue baby ASA.  Kerin Ransom PA-C 03/16/2018 2:11 PM

## 2018-03-16 NOTE — Telephone Encounter (Signed)
Left message for patient to contact office.

## 2018-03-23 NOTE — Telephone Encounter (Signed)
Found old dpr from Left detailed message advising patient of Dr Croitoru's recommendations. Advised patient to contact office with any questions.

## 2018-04-25 ENCOUNTER — Other Ambulatory Visit (INDEPENDENT_AMBULATORY_CARE_PROVIDER_SITE_OTHER): Payer: 59

## 2018-04-25 DIAGNOSIS — E781 Pure hyperglyceridemia: Secondary | ICD-10-CM | POA: Diagnosis not present

## 2018-04-25 DIAGNOSIS — E785 Hyperlipidemia, unspecified: Secondary | ICD-10-CM | POA: Diagnosis not present

## 2018-04-25 DIAGNOSIS — E11319 Type 2 diabetes mellitus with unspecified diabetic retinopathy without macular edema: Secondary | ICD-10-CM

## 2018-04-25 LAB — COMPREHENSIVE METABOLIC PANEL
ALK PHOS: 45 U/L (ref 39–117)
ALT: 39 U/L (ref 0–53)
AST: 27 U/L (ref 0–37)
Albumin: 4.9 g/dL (ref 3.5–5.2)
BUN: 27 mg/dL — AB (ref 6–23)
CO2: 26 mEq/L (ref 19–32)
CREATININE: 1.59 mg/dL — AB (ref 0.40–1.50)
Calcium: 9.7 mg/dL (ref 8.4–10.5)
Chloride: 106 mEq/L (ref 96–112)
GFR: 59.75 mL/min — ABNORMAL LOW (ref >60.00)
GLUCOSE: 140 mg/dL — AB (ref 70–99)
POTASSIUM: 4.5 meq/L (ref 3.5–5.1)
SODIUM: 140 meq/L (ref 135–145)
Total Bilirubin: 0.4 mg/dL (ref 0.2–1.2)
Total Protein: 8.1 g/dL (ref 6.0–8.3)

## 2018-04-25 LAB — HEMOGLOBIN A1C: HEMOGLOBIN A1C: 5.1 % (ref 4.6–6.5)

## 2018-04-25 LAB — LIPID PANEL
CHOLESTEROL: 118 mg/dL (ref 0–200)
HDL: 27.6 mg/dL — ABNORMAL LOW (ref >39.00)
NonHDL: 90.58
Total CHOL/HDL Ratio: 4
Triglycerides: 265 mg/dL — ABNORMAL HIGH (ref 0.0–149.0)
VLDL: 53 mg/dL — AB (ref 0.0–40.0)

## 2018-04-25 LAB — LDL CHOLESTEROL, DIRECT: Direct LDL: 50 mg/dL

## 2018-05-02 ENCOUNTER — Telehealth: Payer: Self-pay | Admitting: *Deleted

## 2018-05-02 ENCOUNTER — Ambulatory Visit: Payer: 59 | Admitting: Family Medicine

## 2018-05-02 ENCOUNTER — Encounter: Payer: Self-pay | Admitting: Family Medicine

## 2018-05-02 VITALS — BP 130/72 | HR 59 | Temp 98.8°F | Ht 71.5 in | Wt 239.5 lb

## 2018-05-02 DIAGNOSIS — E781 Pure hyperglyceridemia: Secondary | ICD-10-CM | POA: Diagnosis not present

## 2018-05-02 DIAGNOSIS — E669 Obesity, unspecified: Secondary | ICD-10-CM | POA: Diagnosis not present

## 2018-05-02 DIAGNOSIS — N1831 Chronic kidney disease, stage 3a: Secondary | ICD-10-CM | POA: Insufficient documentation

## 2018-05-02 DIAGNOSIS — E785 Hyperlipidemia, unspecified: Secondary | ICD-10-CM

## 2018-05-02 DIAGNOSIS — Z23 Encounter for immunization: Secondary | ICD-10-CM | POA: Diagnosis not present

## 2018-05-02 DIAGNOSIS — E119 Type 2 diabetes mellitus without complications: Secondary | ICD-10-CM | POA: Diagnosis not present

## 2018-05-02 DIAGNOSIS — R7989 Other specified abnormal findings of blood chemistry: Secondary | ICD-10-CM

## 2018-05-02 LAB — MICROALBUMIN / CREATININE URINE RATIO
CREATININE, U: 78.5 mg/dL
MICROALB UR: 1.4 mg/dL (ref 0.0–1.9)
Microalb Creat Ratio: 1.8 mg/g (ref 0.0–30.0)

## 2018-05-02 MED ORDER — METFORMIN HCL 1000 MG PO TABS: 500.0000 mg | ORAL_TABLET | Freq: Two times a day (BID) | ORAL | 11 refills | Status: DC

## 2018-05-02 NOTE — Assessment & Plan Note (Signed)
Very good control  Lab Results  Component Value Date   HGBA1C 5.1 04/25/2018   Will cut metformin to 500 mg bid based on inc Cr microalb today  Flu shot given  Eye and foot care discussed

## 2018-05-02 NOTE — Assessment & Plan Note (Signed)
Trig still high but improved from original Continues statin /gemfirozil and lovaza  Enc better diet

## 2018-05-02 NOTE — Assessment & Plan Note (Signed)
Discussed how this problem influences overall health and the risks it imposes  Reviewed plan for weight loss with lower calorie diet (via better food choices and also portion control or program like weight watchers) and exercise building up to or more than 30 minutes 5 days per week including some aerobic activity    

## 2018-05-02 NOTE — Assessment & Plan Note (Addendum)
Cr up to 1.59  Enc water intake  Will cut metformin to 500 mg bid microalb test today Re check renal panel in 1-2 mo

## 2018-05-02 NOTE — Telephone Encounter (Signed)
Patient dropped off a form to the office 04-29-18 for DOT. Form filled out and signed by dr Stanford Breed. Form faxed to [phone]. Left message for the patient that paperwork has been faxed.

## 2018-05-02 NOTE — Patient Instructions (Addendum)
Try to stay away from sugar drinks as much as possible (lemonade/tea/fruit punch)  Drink more water and unsweetened tea   Cut metformin to 1/2 pill twice daily (500 mg twice daily)  Make sure to drink your water as well  This will be better for kidney healthy   We will send DOT form to your cardiology office   Follow up for an annual exam in 6 months with labs prior   Let's re check kidney function in 1-2 months (lab only)   Keep working on diet and exercise

## 2018-05-02 NOTE — Assessment & Plan Note (Signed)
Disc goals for lipids and reasons to control them Rev last labs with pt Rev low sat fat diet in detail  Statin and lofibra and fenofibrate  Trig still high-enc better diet with less fat and processed carbs

## 2018-05-02 NOTE — Progress Notes (Signed)
Subjective:    Patient ID: Andrew Burgess, male    DOB: 07-20-1970, 47 y.o.   MRN: [id number]  HPI  Here for f/u of chronic health problems   Wt Readings from Last 3 Encounters:  05/02/18 239 lb 8 oz (108.6 kg)  02/17/18 236 lb 3.2 oz (107.1 kg)  10/25/17 230 lb 8 oz (104.6 kg)  struggling with eating habits as usual  More water intake More consistent with food choices and also exercise  Has stopped eating cheeseburgers  32.94 kg/m   BP BP Readings from Last 3 Encounters:  05/02/18 130/72  02/17/18 116/74  10/25/17 118/62   Pulse Readings from Last 3 Encounters:  05/02/18 (!) 59  02/17/18 (!) 58  10/25/17 65    Diabetes Home sugar results -no low glucose episodes  DM diet - has had some fruit punch and sweet tea  Exercise - walking and bike (needs to go back to the gym)  Symptoms-none  A1C last  Lab Results  Component Value Date   HGBA1C 5.1 04/25/2018  this is down from 5.2 Lab Results  Component Value Date   MICROALBUR 2.4 (H) 11/23/2016    No problems with medications  Renal protection-none  Last eye exam  2/19  Hyperlipidemia Lab Results  Component Value Date   CHOL 118 04/25/2018   CHOL 97 10/18/2017   CHOL 77 07/26/2017   Lab Results  Component Value Date   HDL 27.60 (L) 04/25/2018   HDL 22.90 (L) 10/18/2017   HDL 21.20 (L) 07/26/2017   Lab Results  Component Value Date   LDLCALC 20 07/26/2017   LDLCALC 46 05/22/2016   LDLCALC UNABLE TO CALCULATE IF TRIGLYCERIDE OVER 400 mg/dL 01/21/2016   Lab Results  Component Value Date   TRIG 265.0 (H) 04/25/2018   TRIG 214.0 (H) 10/18/2017   TRIG 179.0 (H) 07/26/2017   Lab Results  Component Value Date   CHOLHDL 4 04/25/2018   CHOLHDL 4 10/18/2017   CHOLHDL 4 07/26/2017   Lab Results  Component Value Date   LDLDIRECT 50.0 04/25/2018   LDLDIRECT 42.0 10/18/2017   LDLDIRECT 34.0 11/16/2016   On statin and lovaza and fenofibrate  CAD - no new problems Needs form filled out from [employer] -  faxed to cardiology office   Lab Results  Component Value Date   CREATININE 1.59 (H) 04/25/2018   BUN 27 (H) 04/25/2018   NA 140 04/25/2018   K 4.5 04/25/2018   CL 106 04/25/2018   CO2 26 04/25/2018  unsure if drinking enough water Takes 1000 mg of metformin   Patient Active Problem List   Diagnosis Date Noted  . Elevated serum creatinine 05/02/2018  . Family history of coronary artery disease 02/17/2018  . Obesity (BMI 30-39.9) 10/25/2017  . Sickle cell trait ([place]) 07/26/2017  . Hyperosmolar non-ketotic state in patient with type 2 diabetes mellitus ([place]) 07/12/2017  . Routine general medical examination at a health care facility 11/08/2016  . Low testosterone in male 06/02/2016  . Low libido 05/22/2016  . CAD S/P percutaneous coronary angioplasty   . Dyslipidemia, goal LDL below 70   . Non-insulin dependent type 2 diabetes mellitus ([place])   . Status post coronary artery stent placement   . Acute MI inferior lateral first episode care ([place]) 01/18/2016  . Screen for STD (sexually transmitted disease) 01/12/2011  . Obesity 11/14/2007  . ALLERGIC RHINITIS 11/11/2007  . Hypertriglyceridemia 03/04/2007   Past Medical History:  Diagnosis Date  . Allergy   .  CAD (coronary artery disease)    a. 01/2016: Peak troponin 23.15. Left heart cath on 01/21/16 showed 100% occluded mid-dist RCA occlusion with heavy thrombus burden s/p DES. Rx asp thrombectomy and post procedure Aggrastat. Re look 01/21/16 revealed persistent thrombus in prox aspect of stent as well as a distal lesion both of which were intervened on successfully with placement of 2 additional DES.  . Diabetes mellitus ([place])   . Hyperglycemia   . Hyperlipidemia    triglyceriedes   . Hypertriglyceridemia   . Obesity   . Sickle cell trait [facility])    Past Surgical History:  Procedure Laterality Date  . ANKLE SURGERY    . CARDIAC CATHETERIZATION N/A 01/18/2016   Procedure: Left Heart Cath and Coronary Angiography;  Surgeon:  Jettie Booze, MD;  Location: [place] [facility];  Service: Cardiovascular;  Laterality: N/A;  . CARDIAC CATHETERIZATION N/A 01/18/2016   Procedure: Coronary Stent Intervention;  Surgeon: Jettie Booze, MD;  Location: [place] [facility];  Service: Cardiovascular;  Laterality: N/A;  MID RCA  . CARDIAC CATHETERIZATION N/A 01/21/2016   Procedure: Left Heart Cath and Coronary Angiography;  Surgeon: Troy Sine, MD;  Location: [place] [facility];  Service: Cardiovascular;  Laterality: N/A;  . CARDIAC CATHETERIZATION N/A 01/21/2016   Procedure: Coronary Stent Intervention;  Surgeon: Troy Sine, MD;  Location: [place] [facility];  Service: Cardiovascular;  Laterality: N/A;   Social History   Tobacco Use  . Smoking status: Former Smoker    Types: Cigars    Last attempt to quit: 01/18/2012    Years since quitting: 6.2  . Smokeless tobacco: Never Used  Substance Use Topics  . Alcohol use: Yes    Alcohol/week: 0.0 standard drinks    Comment: rare  . Drug use: No   Family History  Problem Relation Age of Onset  . Coronary artery disease Mother   . Heart attack Mother   . Hyperlipidemia Mother   . Alcohol abuse Father   . Hyperlipidemia Father   . Hypertension Father   . Hypertension Brother   . Cancer Maternal Grandmother        brain   No Known Allergies Current Outpatient Medications on File Prior to Visit  Medication Sig Dispense Refill  . aspirin EC 81 MG tablet Take 81 mg by mouth daily.    [name] atorvastatin (LIPITOR) 80 MG tablet TAKE 1 TABLET (80 MG TOTAL) BY MOUTH DAILY AT 6 PM. 90 tablet 3  . blood glucose meter kit and supplies Dispense based on patient and insurance preference. Use up to four times daily as directed. (FOR ICD-10 E10.9, E11.9). 1 each 0  . BRILINTA 90 MG TABS tablet TAKE 1 TABLET BY MOUTH 2 TIMES DAILY. 180 tablet 1  . fenofibrate 160 MG tablet Take 1 tablet (160 mg total) by mouth daily. 30 tablet 11  . metoprolol tartrate (LOPRESSOR) 25 MG tablet  TAKE 1 TABLET BY MOUTH 2 TIMES DAILY 180 tablet 2  . nitroGLYCERIN (NITROSTAT) 0.4 MG SL tablet Place 1 tablet (0.4 mg total) under the tongue every 5 (five) minutes as needed for chest pain (3 DOSES MAX). 25 tablet 2  . omega-3 acid ethyl esters (LOVAZA) 1 g capsule TAKE 1 CAPSULE BY MOUTH 2 TIMES DAILY 60 capsule 11  . glipiZIDE (GLUCOTROL) 5 MG tablet Take 1 tablet (5 mg total) by mouth daily before breakfast. 30 tablet 11   No current facility-administered medications on file prior to visit.  Review of Systems  Constitutional: Negative for activity change, appetite change, fatigue, fever and unexpected weight change.  HENT: Negative for congestion, rhinorrhea, sore throat and trouble swallowing.   Eyes: Negative for pain, redness, itching and visual disturbance.  Respiratory: Negative for cough, chest tightness, shortness of breath and wheezing.   Cardiovascular: Negative for chest pain and palpitations.  Gastrointestinal: Negative for abdominal pain, blood in stool, constipation, diarrhea and nausea.  Endocrine: Negative for cold intolerance, heat intolerance, polydipsia and polyuria.  Genitourinary: Negative for difficulty urinating, dysuria, frequency and urgency.  Musculoskeletal: Negative for arthralgias, joint swelling and myalgias.  Skin: Negative for pallor and rash.  Neurological: Negative for dizziness, tremors, weakness, numbness and headaches.  Hematological: Negative for adenopathy. Does not bruise/bleed easily.  Psychiatric/Behavioral: Negative for decreased concentration and dysphoric mood. The patient is not nervous/anxious.        Objective:   Physical Exam  Constitutional: He appears well-developed and well-nourished. No distress.  obese and well appearing   HENT:  Head: Normocephalic and atraumatic.  Mouth/Throat: Oropharynx is clear and moist.  Eyes: Pupils are equal, round, and reactive to light. Conjunctivae and EOM are normal.  Neck: Normal range of  motion. Neck supple. No JVD present. Carotid bruit is not present. No thyromegaly present.  Cardiovascular: Normal rate, regular rhythm, normal heart sounds and intact distal pulses. Exam reveals no gallop.  Pulmonary/Chest: Effort normal and breath sounds normal. No stridor. No respiratory distress. He has no wheezes. He has no rales.  No crackles  Abdominal: Soft. Bowel sounds are normal. He exhibits no distension, no abdominal bruit and no mass. There is no tenderness.  Musculoskeletal: He exhibits no edema or deformity.  Lymphadenopathy:    He has no cervical adenopathy.  Neurological: He is alert. He has normal reflexes. He displays normal reflexes. No cranial nerve deficit. Coordination normal.  Skin: Skin is warm and dry. No rash noted. No erythema.  Psychiatric: He has a normal mood and affect.  Pleasant           Assessment & Plan:   Problem List Items Addressed This Visit      Endocrine   Non-insulin dependent type 2 diabetes mellitus ([place]) - Primary    Very good control  Lab Results  Component Value Date   HGBA1C 5.1 04/25/2018   Will cut metformin to 500 mg bid based on inc Cr microalb today  Flu shot given  Eye and foot care discussed       Relevant Medications   metFORMIN (GLUCOPHAGE) 1000 MG tablet   Other Relevant Orders   Microalbumin / creatinine urine ratio (Completed)     Other   Dyslipidemia, goal LDL below 70    Disc goals for lipids and reasons to control them Rev last labs with pt Rev low sat fat diet in detail  Statin and lofibra and fenofibrate  Trig still high-enc better diet with less fat and processed carbs        Elevated serum creatinine    Cr up to 1.59  Enc water intake  Will cut metformin to 500 mg bid microalb test today Re check renal panel in 1-2 mo       Hypertriglyceridemia    Trig still high but improved from original Continues statin /gemfirozil and lovaza  Enc better diet      Obesity (BMI 30-39.9)    Discussed  how this problem influences overall health and the risks it imposes  Reviewed plan for weight loss with  lower calorie diet (via better food choices and also portion control or program like weight watchers) and exercise building up to or more than 30 minutes 5 days per week including some aerobic activity         Relevant Medications   metFORMIN (GLUCOPHAGE) 1000 MG tablet    Other Visit Diagnoses    Need for influenza vaccination       Relevant Orders   Flu Vaccine QUAD 6+ mos PF IM (Fluarix Quad PF) (Completed)

## 2018-06-13 ENCOUNTER — Other Ambulatory Visit (INDEPENDENT_AMBULATORY_CARE_PROVIDER_SITE_OTHER): Payer: 59

## 2018-06-13 DIAGNOSIS — R7989 Other specified abnormal findings of blood chemistry: Secondary | ICD-10-CM | POA: Diagnosis not present

## 2018-06-13 LAB — RENAL FUNCTION PANEL
Albumin: 4.7 g/dL (ref 3.5–5.2)
BUN: 24 mg/dL — AB (ref 6–23)
CHLORIDE: 107 meq/L (ref 96–112)
CO2: 24 mEq/L (ref 19–32)
Calcium: 9.6 mg/dL (ref 8.4–10.5)
Creatinine, Ser: 1.47 mg/dL (ref 0.40–1.50)
GFR: 65.38 mL/min (ref >60.00)
Glucose, Bld: 138 mg/dL — ABNORMAL HIGH (ref 70–99)
Phosphorus: 3.3 mg/dL (ref 2.3–4.6)
Potassium: 3.9 mEq/L (ref 3.5–5.1)
Sodium: 141 mEq/L (ref 135–145)

## 2018-07-01 ENCOUNTER — Other Ambulatory Visit: Payer: Self-pay | Admitting: *Deleted

## 2018-07-01 MED ORDER — METFORMIN HCL 500 MG PO TABS: 500.0000 mg | ORAL_TABLET | Freq: Two times a day (BID) | ORAL | 1 refills | Status: DC

## 2018-07-01 NOTE — Telephone Encounter (Signed)
Insurance will not cover 1000mg  tab with 1/2 tab dosing since there is a 500mg  tab Rx sent

## 2018-07-31 ENCOUNTER — Other Ambulatory Visit: Payer: Self-pay | Admitting: Family Medicine

## 2018-08-17 ENCOUNTER — Other Ambulatory Visit: Payer: Self-pay | Admitting: Family Medicine

## 2018-10-23 ENCOUNTER — Telehealth: Payer: Self-pay | Admitting: Family Medicine

## 2018-10-23 DIAGNOSIS — R7989 Other specified abnormal findings of blood chemistry: Secondary | ICD-10-CM

## 2018-10-23 DIAGNOSIS — E785 Hyperlipidemia, unspecified: Secondary | ICD-10-CM

## 2018-10-23 DIAGNOSIS — Z0000 Encounter for general adult medical examination without abnormal findings: Secondary | ICD-10-CM

## 2018-10-23 DIAGNOSIS — E781 Pure hyperglyceridemia: Secondary | ICD-10-CM

## 2018-10-23 DIAGNOSIS — E119 Type 2 diabetes mellitus without complications: Secondary | ICD-10-CM

## 2018-10-23 DIAGNOSIS — Z125 Encounter for screening for malignant neoplasm of prostate: Secondary | ICD-10-CM

## 2018-10-23 NOTE — Telephone Encounter (Signed)
-----   Message from Ellamae Sia sent at 10/18/2018 10:02 AM EDT ----- Regarding: Lab orders for Monday,5. 11.20 Patient is scheduled for CPX labs, please order future labs, Thanks , Karna Christmas

## 2018-10-24 ENCOUNTER — Other Ambulatory Visit: Payer: 59

## 2018-10-31 ENCOUNTER — Other Ambulatory Visit: Payer: Self-pay | Admitting: Cardiology

## 2018-10-31 ENCOUNTER — Other Ambulatory Visit: Payer: Self-pay

## 2018-10-31 ENCOUNTER — Encounter: Payer: 59 | Admitting: Family Medicine

## 2018-10-31 MED ORDER — TICAGRELOR 90 MG PO TABS: 90.0000 mg | ORAL_TABLET | Freq: Two times a day (BID) | ORAL | 1 refills | Status: DC

## 2018-10-31 NOTE — Telephone Encounter (Signed)
Rx(s) sent to pharmacy electronically.  

## 2018-11-06 ENCOUNTER — Other Ambulatory Visit: Payer: Self-pay | Admitting: Family Medicine

## 2018-11-12 ENCOUNTER — Telehealth: Payer: Self-pay | Admitting: Physician Assistant

## 2018-11-12 NOTE — Telephone Encounter (Signed)
Call from patient concerning brilinta. Message was that pharmacy won't refill brilinta without a call from the physician. Pt was called twice, left VM. Pharmacy states script was sent from Dr. Stanford Breed electronically May 2020. Phone note 02/2018 appears that Dr. Sallyanne Kuster was consulted and advised to stop brilinta. Appears stents were placed in 2017.   Dr. Stanford Breed - please clarify if the patient needs to continue brilinta - if so, may need a new script for 60 mg BID instead of the 90 mg dose.   Tami Lin Duke, PA-C 11/12/2018, 11:57 AM

## 2018-11-14 ENCOUNTER — Telehealth: Payer: Self-pay

## 2018-11-14 NOTE — Telephone Encounter (Signed)
Patient calls twice over the weekend asking for refill on his Brilinta.  He he was told by nurse to contact his cardiologist as he is the one managing this medication.  It appears that the cardiologist changed him to Plavix on 5/18 and patient needs to discuss with their office what is going on with this medication.   Notified patient.  He will call them now to discuss.

## 2018-11-14 NOTE — Telephone Encounter (Signed)
plavix was refilled to pharmacy on 10/31/2018 with no notation in chart why this was filled instead of brilinta  Routed to MD/RN

## 2018-11-14 NOTE — Telephone Encounter (Signed)
Patient called wanting to know if he can get Brilinta refilled.

## 2018-11-14 NOTE — Telephone Encounter (Signed)
I will cc Dr Stanford Breed

## 2018-11-14 NOTE — Telephone Encounter (Signed)
[facility] Patient Name: Andrew Burgess Gender: Male DOB: 04-Apr-1971 Age: 48 Y 9 M 15 D Return Phone Number: [phone] (Primary) Address: City/State/Zip: [place]  [address] Client [facility] Night - Client Client Site [facility], Roque Lias - MD Contact Type Call Who Is Calling Patient / Member / Family / Caregiver Call Type Triage / Clinical Relationship To Patient Self Return Phone Number [phone] (Primary) Chief Complaint Prescription Refill or Medication Request (non symptomatic) Reason for Call Medication Question / Request Initial Comment Caller states he cannot get his prescription for brillinta. He is out of the medication and pharmacy states they have sent a request to Dr. Glori Bickers. Translation No Nurse Assessment Nurse: Orpah Clinton, RN, Lattie Haw Date/Time (Eastern Time): 11/11/2018 6:12:07 PM Please select the assessment type ---Refill Additional Documentation ---Caller states he cannot get his prescription for brillinta. He is out of the medication and pharmacy states they have sent a request to Dr. Glori Bickers 5-6 days ago and havent heard back. Does the patient have enough medication to last until the office opens? ---Yes Additional Documentation ---[facility] [phone] NKDA Guidelines Guideline Title Affirmed Question Affirmed Notes Nurse Date/Time Eilene Ghazi Time) Disp. Time Eilene Ghazi Time) Disposition Final User 11/11/2018 6:21:59 PM Clinical Call Yes [place], RN, Leland Johns Disagree/Comply Comply Caller Understands Yes PreDisposition Call Doctor Comments User: Larene Beach, RN Date/Time Eilene Ghazi Time): 11/11/2018 6:21:53 PM caller given number for sat clinic opening at [place] and advised to call and lv msg and wait for callback with appt, and to speak with someone in office for refill per post triage  directives.

## 2018-11-14 NOTE — Telephone Encounter (Signed)
Spoke with pt, Aware of dr crenshaw's recommendations.  °

## 2018-11-14 NOTE — Telephone Encounter (Signed)
DC brilinta and continue ASA Kirk Ruths

## 2018-12-29 ENCOUNTER — Telehealth: Payer: Self-pay | Admitting: Family Medicine

## 2018-12-29 NOTE — Telephone Encounter (Signed)
Ask him to call cardiology for refill since they last refilled.  If at any point they want me to take it over they can alert me.  For some reason if he cannot get through to them let me know (if that is the # on the bottle then the pharmacy should just contact them, correct?) Thanks

## 2018-12-29 NOTE — Telephone Encounter (Signed)
Pt called pharmacy to request refill on Metoprolol. They attempted to send request but I do not see anything in the chart. [facility] [address] He is out completely. Pt can be reached at [phone].

## 2018-12-29 NOTE — Telephone Encounter (Signed)
See prev note, pt's cardiologist refilled med last, please advise is you want to fill this med

## 2018-12-29 NOTE — Telephone Encounter (Signed)
Pt didn't realize cardiology fills meds so he will call them now. I did advise pt to call us back if there is any issues

## 2018-12-30 ENCOUNTER — Other Ambulatory Visit: Payer: Self-pay | Admitting: Cardiology

## 2018-12-30 MED ORDER — METOPROLOL TARTRATE 25 MG PO TABS: 25.0000 mg | ORAL_TABLET | Freq: Two times a day (BID) | ORAL | 0 refills | Status: DC

## 2018-12-30 NOTE — Telephone Encounter (Signed)
New message    *STAT* If patient is at the pharmacy, call can be transferred to refill team.   1. Which medications need to be refilled? (please list name of each medication and dose if known) metoprolol tartrate (LOPRESSOR) 25 MG tablet  2. Which pharmacy/location (including street and city if local pharmacy) is medication to be sent to?[id number] [place], [place] [address].  3. Do they need a 30 day or 90 day supply? [place]

## 2018-12-30 NOTE — Telephone Encounter (Signed)
Metoprolol tartrate 25 mg twice daily refilled.

## 2018-12-31 ENCOUNTER — Other Ambulatory Visit: Payer: Self-pay | Admitting: Family Medicine

## 2019-01-26 ENCOUNTER — Other Ambulatory Visit: Payer: Self-pay | Admitting: Family Medicine

## 2019-01-27 ENCOUNTER — Other Ambulatory Visit: Payer: Self-pay | Admitting: Cardiology

## 2019-01-27 ENCOUNTER — Other Ambulatory Visit: Payer: Self-pay

## 2019-01-27 MED ORDER — BRILINTA 90 MG PO TABS: 90.0000 mg | ORAL_TABLET | Freq: Two times a day (BID) | ORAL | 0 refills | Status: DC

## 2019-01-27 MED ORDER — BRILINTA 90 MG PO TABS: 90.0000 mg | ORAL_TABLET | Freq: Two times a day (BID) | ORAL | 1 refills | Status: DC

## 2019-01-27 NOTE — Telephone Encounter (Signed)
New  Message    *STAT* If patient is at the pharmacy, call can be transferred to refill team.   1. Which medications need to be refilled? (please list name of each medication and dose if known) BRILINTA 90 MG TABS tablet  2. Which pharmacy/location (including street and city if local pharmacy) is medication to be sent to?[id number] [place], [place] [facility] [address].  3. Do they need a 30 day or 90 day supply?[place]

## 2019-01-27 NOTE — Telephone Encounter (Signed)
Let patient know that I refilled his Brilinta.

## 2019-02-03 ENCOUNTER — Other Ambulatory Visit: Payer: Self-pay

## 2019-02-03 MED ORDER — ATORVASTATIN CALCIUM 80 MG PO TABS: 80.0000 mg | ORAL_TABLET | Freq: Every day | ORAL | 0 refills | Status: DC

## 2019-02-22 ENCOUNTER — Other Ambulatory Visit: Payer: Self-pay | Admitting: Cardiology

## 2019-02-23 ENCOUNTER — Telehealth: Payer: Self-pay

## 2019-02-23 NOTE — Telephone Encounter (Signed)
Left detailed VM w COVID screen and back door lab info and front door info   

## 2019-02-27 ENCOUNTER — Other Ambulatory Visit: Payer: Self-pay

## 2019-02-27 ENCOUNTER — Other Ambulatory Visit (INDEPENDENT_AMBULATORY_CARE_PROVIDER_SITE_OTHER): Payer: 59

## 2019-02-27 DIAGNOSIS — R7989 Other specified abnormal findings of blood chemistry: Secondary | ICD-10-CM

## 2019-02-27 DIAGNOSIS — Z0000 Encounter for general adult medical examination without abnormal findings: Secondary | ICD-10-CM

## 2019-02-27 DIAGNOSIS — Z125 Encounter for screening for malignant neoplasm of prostate: Secondary | ICD-10-CM

## 2019-02-27 DIAGNOSIS — E785 Hyperlipidemia, unspecified: Secondary | ICD-10-CM

## 2019-02-27 DIAGNOSIS — E119 Type 2 diabetes mellitus without complications: Secondary | ICD-10-CM

## 2019-02-27 DIAGNOSIS — E781 Pure hyperglyceridemia: Secondary | ICD-10-CM | POA: Diagnosis not present

## 2019-02-27 LAB — LIPID PANEL
Cholesterol: 120 mg/dL (ref 0–200)
HDL: 26.6 mg/dL — ABNORMAL LOW (ref >39.00)
NonHDL: 92.95
Total CHOL/HDL Ratio: 4
Triglycerides: 288 mg/dL — ABNORMAL HIGH (ref 0.0–149.0)
VLDL: 57.6 mg/dL — ABNORMAL HIGH (ref 0.0–40.0)

## 2019-02-27 LAB — COMPREHENSIVE METABOLIC PANEL
ALT: 45 U/L (ref 0–53)
AST: 27 U/L (ref 0–37)
Albumin: 4.9 g/dL (ref 3.5–5.2)
Alkaline Phosphatase: 54 U/L (ref 39–117)
BUN: 22 mg/dL (ref 6–23)
CO2: 26 mEq/L (ref 19–32)
Calcium: 10.3 mg/dL (ref 8.4–10.5)
Chloride: 108 mEq/L (ref 96–112)
Creatinine, Ser: 1.53 mg/dL — ABNORMAL HIGH (ref 0.40–1.50)
GFR: 58.57 mL/min — ABNORMAL LOW (ref >60.00)
Glucose, Bld: 172 mg/dL — ABNORMAL HIGH (ref 70–99)
Potassium: 4.3 mEq/L (ref 3.5–5.1)
Sodium: 143 mEq/L (ref 135–145)
Total Bilirubin: 0.4 mg/dL (ref 0.2–1.2)
Total Protein: 7.8 g/dL (ref 6.0–8.3)

## 2019-02-27 LAB — LDL CHOLESTEROL, DIRECT: Direct LDL: 47 mg/dL

## 2019-02-27 LAB — CBC WITH DIFFERENTIAL/PLATELET
Basophils Absolute: 0 10*3/uL (ref 0.0–0.1)
Basophils Relative: 0.5 % (ref 0.0–3.0)
Eosinophils Absolute: 0.2 10*3/uL (ref 0.0–0.7)
Eosinophils Relative: 2.2 % (ref 0.0–5.0)
HCT: 41.5 % (ref 39.0–52.0)
Hemoglobin: 13.7 g/dL (ref 13.0–17.0)
Lymphocytes Relative: 30.8 % (ref 12.0–46.0)
Lymphs Abs: 2.7 10*3/uL (ref 0.7–4.0)
MCHC: 33.1 g/dL (ref 30.0–36.0)
MCV: 88.6 fl (ref 78.0–100.0)
Monocytes Absolute: 0.7 10*3/uL (ref 0.1–1.0)
Monocytes Relative: 8.3 % (ref 3.0–12.0)
Neutro Abs: 5 10*3/uL (ref 1.4–7.7)
Neutrophils Relative %: 58.2 % (ref 43.0–77.0)
Platelets: 298 10*3/uL (ref 150.0–400.0)
RBC: 4.68 Mil/uL (ref 4.22–5.81)
RDW: 13.7 % (ref 11.5–15.5)
WBC: 8.7 10*3/uL (ref 4.0–10.5)

## 2019-02-27 LAB — HEMOGLOBIN A1C: Hgb A1c MFr Bld: 6.8 % — ABNORMAL HIGH (ref 4.6–6.5)

## 2019-02-27 LAB — PSA: PSA: 0.09 ng/mL — ABNORMAL LOW (ref 0.10–4.00)

## 2019-02-27 LAB — TSH: TSH: 3.51 u[IU]/mL (ref 0.35–4.50)

## 2019-03-06 ENCOUNTER — Encounter: Payer: Self-pay | Admitting: Family Medicine

## 2019-03-06 ENCOUNTER — Ambulatory Visit (INDEPENDENT_AMBULATORY_CARE_PROVIDER_SITE_OTHER): Payer: 59 | Admitting: Family Medicine

## 2019-03-06 ENCOUNTER — Other Ambulatory Visit: Payer: Self-pay

## 2019-03-06 VITALS — BP 114/68 | HR 56 | Temp 97.0°F | Ht 71.5 in | Wt 242.1 lb

## 2019-03-06 DIAGNOSIS — E785 Hyperlipidemia, unspecified: Secondary | ICD-10-CM

## 2019-03-06 DIAGNOSIS — Z0000 Encounter for general adult medical examination without abnormal findings: Secondary | ICD-10-CM | POA: Diagnosis not present

## 2019-03-06 DIAGNOSIS — D573 Sickle-cell trait: Secondary | ICD-10-CM

## 2019-03-06 DIAGNOSIS — E11319 Type 2 diabetes mellitus with unspecified diabetic retinopathy without macular edema: Secondary | ICD-10-CM | POA: Diagnosis not present

## 2019-03-06 DIAGNOSIS — I2510 Atherosclerotic heart disease of native coronary artery without angina pectoris: Secondary | ICD-10-CM

## 2019-03-06 DIAGNOSIS — Z9861 Coronary angioplasty status: Secondary | ICD-10-CM

## 2019-03-06 DIAGNOSIS — Z125 Encounter for screening for malignant neoplasm of prostate: Secondary | ICD-10-CM

## 2019-03-06 DIAGNOSIS — Z1211 Encounter for screening for malignant neoplasm of colon: Secondary | ICD-10-CM

## 2019-03-06 DIAGNOSIS — E669 Obesity, unspecified: Secondary | ICD-10-CM

## 2019-03-06 DIAGNOSIS — R7989 Other specified abnormal findings of blood chemistry: Secondary | ICD-10-CM

## 2019-03-06 DIAGNOSIS — Z23 Encounter for immunization: Secondary | ICD-10-CM | POA: Diagnosis not present

## 2019-03-06 DIAGNOSIS — E781 Pure hyperglyceridemia: Secondary | ICD-10-CM

## 2019-03-06 MED ORDER — FENOFIBRATE 160 MG PO TABS: 160.0000 mg | ORAL_TABLET | Freq: Every day | ORAL | 3 refills | Status: DC

## 2019-03-06 MED ORDER — METFORMIN HCL 500 MG PO TABS: ORAL_TABLET | ORAL | 3 refills | Status: DC

## 2019-03-06 MED ORDER — OMEGA-3-ACID ETHYL ESTERS 1 G PO CAPS: 1.0000 | ORAL_CAPSULE | Freq: Two times a day (BID) | ORAL | 3 refills | Status: DC

## 2019-03-06 NOTE — Progress Notes (Signed)
Subjective:    Patient ID: Andrew Burgess, male    DOB: 09-Mar-1971, 48 y.o.   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  Still needs to work on diet more  33.29 kg/m   Eye exam-needs to make appt No change in vision   Flu vaccine - will get today   Colon cancer screening -has not had a colonoscopy yet  No family hx of colon cancer    Tdap 6/18 pna 23 vaccine 2/19  May be interested in shingrix if it is covered    Prostate health Lab Results  Component Value Date   PSA 0.09 (L) 02/27/2019   no fam hx of prostate cancer  No change in flow  Nocturia at least once     BP Readings from Last 3 Encounters:  03/06/19 114/68  05/02/18 130/72  02/17/18 116/74   Pulse Readings from Last 3 Encounters:  03/06/19 (!) 56  05/02/18 (!) 59  02/17/18 (!) 58   DM2 Lab Results  Component Value Date   HGBA1C 6.8 (H) 02/27/2019   eye exam- planning  Foot care - no problems  On statin  No ace Lab Results  Component Value Date   MICROALBUR 1.4 05/02/2018  diet-working on it /packing food  Avoids sweets  Does chew gum (needs to change to sugarless)  Walking regularly    Hyperlipidemia Lab Results  Component Value Date   CHOL 120 02/27/2019   CHOL 118 04/25/2018   CHOL 97 10/18/2017   Lab Results  Component Value Date   HDL 26.60 (L) 02/27/2019   HDL 27.60 (L) 04/25/2018   HDL 22.90 (L) 10/18/2017   Lab Results  Component Value Date   LDLCALC 20 07/26/2017   LDLCALC 46 05/22/2016   LDLCALC UNABLE TO CALCULATE IF TRIGLYCERIDE OVER 400 mg/dL 01/21/2016   Lab Results  Component Value Date   TRIG 288.0 (H) 02/27/2019   TRIG 265.0 (H) 04/25/2018   TRIG 214.0  (H) 10/18/2017   Lab Results  Component Value Date   CHOLHDL 4 02/27/2019   CHOLHDL 4 04/25/2018   CHOLHDL 4 10/18/2017   Lab Results  Component Value Date   LDLDIRECT 47.0 02/27/2019   LDLDIRECT 50.0 04/25/2018   LDLDIRECT 42.0 10/18/2017   Taking lipitor and fenofibrate  Eating fairly well  Avoids fast food and greasy food  Too much red meat  Does eat salmon   H/o elevated cr Lab Results  Component Value Date   CREATININE 1.53 (H) 02/27/2019   BUN 22 02/27/2019   NA 143 02/27/2019   K 4.3 02/27/2019   CL 108 02/27/2019   CO2 26 02/27/2019  last visit cut metformin to 500 mg bid  Cr was 1.47 last check  He drinks water - more than 64 oz per day  He takes generic aleve regularly  Lab Results  Component Value Date   ALT 45 02/27/2019   AST 27 02/27/2019   ALKPHOS 54 02/27/2019   BILITOT 0.4 02/27/2019    H/o sickle cell trait Lab Results  Component Value Date   WBC 8.7 02/27/2019   HGB 13.7 02/27/2019  HCT 41.5 02/27/2019   MCV 88.6 02/27/2019   PLT 298.0 02/27/2019   Lab Results  Component Value Date   TSH 3.51 02/27/2019    Patient Active Problem List   Diagnosis Date Noted  . Colon cancer screening 03/06/2019  . Prostate cancer screening 10/23/2018  . Elevated serum creatinine 05/02/2018  . Family history of coronary artery disease 02/17/2018  . Obesity (BMI 30-39.9) 10/25/2017  . Sickle cell trait ([place]) 07/26/2017  . Routine general medical examination at a health care facility 11/08/2016  . Low testosterone in male 06/02/2016  . Low libido 05/22/2016  . CAD S/P percutaneous coronary angioplasty   . Dyslipidemia, goal LDL below 70   . Type 2 diabetes mellitus with retinopathy without macular edema, without long-term current use of insulin ([place])   . Status post coronary artery stent placement   . Acute MI inferior lateral first episode care ([place]) 01/18/2016  . Screen for STD (sexually transmitted disease) 01/12/2011  . ALLERGIC RHINITIS  11/11/2007  . Hypertriglyceridemia 03/04/2007   Past Medical History:  Diagnosis Date  . Allergy   . CAD (coronary artery disease)    a. 01/2016: Peak troponin 23.15. Left heart cath on 01/21/16 showed 100% occluded mid-dist RCA occlusion with heavy thrombus burden s/p DES. Rx asp thrombectomy and post procedure Aggrastat. Re look 01/21/16 revealed persistent thrombus in prox aspect of stent as well as a distal lesion both of which were intervened on successfully with placement of 2 additional DES.  . Diabetes mellitus ([place])   . Hyperglycemia   . Hyperlipidemia    triglyceriedes   . Hypertriglyceridemia   . Obesity   . Sickle cell trait [facility])    Past Surgical History:  Procedure Laterality Date  . ANKLE SURGERY    . CARDIAC CATHETERIZATION N/A 01/18/2016   Procedure: Left Heart Cath and Coronary Angiography;  Surgeon: Jettie Booze, MD;  Location: [place] [facility];  Service: Cardiovascular;  Laterality: N/A;  . CARDIAC CATHETERIZATION N/A 01/18/2016   Procedure: Coronary Stent Intervention;  Surgeon: Jettie Booze, MD;  Location: [place] [facility];  Service: Cardiovascular;  Laterality: N/A;  MID RCA  . CARDIAC CATHETERIZATION N/A 01/21/2016   Procedure: Left Heart Cath and Coronary Angiography;  Surgeon: Troy Sine, MD;  Location: [place] [facility];  Service: Cardiovascular;  Laterality: N/A;  . CARDIAC CATHETERIZATION N/A 01/21/2016   Procedure: Coronary Stent Intervention;  Surgeon: Troy Sine, MD;  Location: [place] [facility];  Service: Cardiovascular;  Laterality: N/A;   Social History   Tobacco Use  . Smoking status: Former Smoker    Types: Cigars    Quit date: 01/18/2012    Years since quitting: 7.1  . Smokeless tobacco: Never Used  Substance Use Topics  . Alcohol use: Yes    Alcohol/week: 0.0 standard drinks    Comment: rare  . Drug use: No   Family History  Problem Relation Age of Onset  . Coronary artery disease Mother   . Heart attack Mother   .  Hyperlipidemia Mother   . Alcohol abuse Father   . Hyperlipidemia Father   . Hypertension Father   . Hypertension Brother   . Cancer Maternal Grandmother        brain   No Known Allergies Current Outpatient Medications on File Prior to Visit  Medication Sig Dispense Refill  . aspirin EC 81 MG tablet Take 81 mg by mouth daily.    [name] atorvastatin (LIPITOR) 80 MG tablet  Take 1 tablet (80 mg total) by mouth daily at 6 PM. 90 tablet 0  . blood glucose meter kit and supplies Dispense based on patient and insurance preference. Use up to four times daily as directed. (FOR ICD-10 E10.9, E11.9). 1 each 0  . BRILINTA 90 MG TABS tablet Take 1 tablet (90 mg total) by mouth 2 (two) times daily. 180 tablet 0  . clopidogrel (PLAVIX) 75 MG tablet Please specify directions, refills and quantity 30 tablet 3  . metoprolol tartrate (LOPRESSOR) 25 MG tablet TAKE 1 TABLET BY MOUTH TWICE A DAY 60 tablet 6  . nitroGLYCERIN (NITROSTAT) 0.4 MG SL tablet Place 1 tablet (0.4 mg total) under the tongue every 5 (five) minutes as needed for chest pain (3 DOSES MAX). 25 tablet 2   No current facility-administered medications on file prior to visit.     Review of Systems  Constitutional: Positive for fatigue. Negative for activity change, appetite change, fever and unexpected weight change.       Some fatigue from work hours  HENT: Negative for congestion, rhinorrhea, sore throat and trouble swallowing.   Eyes: Negative for pain, redness, itching and visual disturbance.  Respiratory: Negative for cough, chest tightness, shortness of breath and wheezing.   Cardiovascular: Negative for chest pain and palpitations.  Gastrointestinal: Negative for abdominal pain, blood in stool, constipation, diarrhea and nausea.  Endocrine: Negative for cold intolerance, heat intolerance, polydipsia and polyuria.  Genitourinary: Negative for difficulty urinating, dysuria, frequency and urgency.  Musculoskeletal: Negative for arthralgias,  joint swelling and myalgias.  Skin: Negative for pallor and rash.  Neurological: Negative for dizziness, tremors, weakness, numbness and headaches.  Hematological: Negative for adenopathy. Does not bruise/bleed easily.  Psychiatric/Behavioral: Negative for decreased concentration and dysphoric mood. The patient is not nervous/anxious.        Objective:   Physical Exam Constitutional:      General: He is not in acute distress.    Appearance: Normal appearance. He is well-developed. He is obese. He is not ill-appearing or diaphoretic.  HENT:     Head: Normocephalic and atraumatic.     Right Ear: Tympanic membrane, ear canal and external ear normal.     Left Ear: Tympanic membrane, ear canal and external ear normal.     Nose: Nose normal. No congestion.     Mouth/Throat:     Mouth: Mucous membranes are moist.     Pharynx: Oropharynx is clear. No posterior oropharyngeal erythema.  Eyes:     General: No scleral icterus.       Right eye: No discharge.        Left eye: No discharge.     Conjunctiva/sclera: Conjunctivae normal.     Pupils: Pupils are equal, round, and reactive to light.  Neck:     Musculoskeletal: Normal range of motion and neck supple. No neck rigidity or muscular tenderness.     Thyroid: No thyromegaly.     Vascular: No carotid bruit or JVD.  Cardiovascular:     Rate and Rhythm: Normal rate and regular rhythm.     Pulses: Normal pulses.     Heart sounds: Normal heart sounds. No gallop.   Pulmonary:     Effort: Pulmonary effort is normal. No respiratory distress.     Breath sounds: Normal breath sounds. No wheezing or rales.     Comments: Good air exch Chest:     Chest wall: No tenderness.  Abdominal:     General: Bowel sounds are normal. There is  no distension or abdominal bruit.     Palpations: Abdomen is soft. There is no mass.     Tenderness: There is no abdominal tenderness.     Hernia: No hernia is present.  Musculoskeletal:        General: No  tenderness.     Right lower leg: No edema.     Left lower leg: No edema.  Lymphadenopathy:     Cervical: No cervical adenopathy.  Skin:    General: Skin is warm and dry.     Coloration: Skin is not pale.     Findings: No erythema or rash.     Comments: Few skin tags Dry skin on feet  Neurological:     Mental Status: He is alert.     Cranial Nerves: No cranial nerve deficit.     Motor: No abnormal muscle tone.     Coordination: Coordination normal.     Gait: Gait normal.     Deep Tendon Reflexes: Reflexes are normal and symmetric. Reflexes normal.  Psychiatric:        Mood and Affect: Mood normal.        Cognition and Memory: Cognition normal.           Assessment & Plan:   Problem List Items Addressed This Visit      Cardiovascular and Mediastinum   CAD S/P percutaneous coronary angioplasty    Pt may be due for cardiology f/u He will call to schedule        Relevant Medications   omega-3 acid ethyl esters (LOVAZA) 1 g capsule   fenofibrate 160 MG tablet     Endocrine   Type 2 diabetes mellitus with retinopathy without macular edema, without long-term current use of insulin ([facility])    On less metformin and no glipizide  and fair diet  Lab Results  Component Value Date   HGBA1C 6.8 (H) 02/27/2019   Fair control  Enc wt loss strongly  Good exercise and foot care Ref done for dm eye exam  Will be due for microalb at next visit        Relevant Medications   metFORMIN (GLUCOPHAGE) 500 MG tablet   Other Relevant Orders   Ambulatory referral to Ophthalmology     Other   Hypertriglyceridemia    Fenofibrate- trig 288 Urged to keep working on diet for fats and carbs       Relevant Medications   omega-3 acid ethyl esters (LOVAZA) 1 g capsule   fenofibrate 160 MG tablet   Dyslipidemia, goal LDL below 70    Disc goals for lipids and reasons to control them Rev last labs with pt Rev low sat fat diet in detail Low HDL -hereditary/working on exercise LDL good  at 20 Trig 288 Taking both statin and fenofibrate  Continues to work on diet  Urged to cut back on red meat       Relevant Medications   omega-3 acid ethyl esters (LOVAZA) 1 g capsule   fenofibrate 160 MG tablet   Routine general medical examination at a health care facility - Primary    Reviewed health habits including diet and exercise and skin cancer prevention Reviewed appropriate screening tests for age  Also reviewed health mt list, fam hx and immunization status , as well as social and family history   See HPI Labs reviewed  Flu shot given  Ref for colonoscopy and dm eye exam done  Discussed shingrix vaccine  Enc wt loos and continued lifestyle  change Below nl psa       Sickle cell trait ([facility])    Cbc stable No clinical symptoms       Obesity (BMI 30-39.9)    Discussed how this problem influences overall health and the risks it imposes  Reviewed plan for weight loss with lower calorie diet (via better food choices and also portion control or program like weight watchers) and exercise building up to or more than 30 minutes 5 days per week including some aerobic activity         Relevant Medications   metFORMIN (GLUCOPHAGE) 500 MG tablet   Elevated serum creatinine    1.53 in diabetic with vascular disease who takes metformin  Also taking aleve regularly  Disc nephrotoxic agents -will stop nsaids If no imp will also have to change metformin  Re check renal profile in 3 mo      Prostate cancer screening    No family hx No symptoms  Lab Results  Component Value Date   PSA 0.09 (L) 02/27/2019         Colon cancer screening    Referral done for first screening colonoscopy       Relevant Orders   Ambulatory referral to Gastroenterology    Other Visit Diagnoses    Need for influenza vaccination       Relevant Orders   Flu Vaccine QUAD 6+ mos PF IM (Fluarix Quad PF) (Completed)

## 2019-03-06 NOTE — Assessment & Plan Note (Signed)
Reviewed health habits including diet and exercise and skin cancer prevention Reviewed appropriate screening tests for age  Also reviewed health mt list, fam hx and immunization status , as well as social and family history   See HPI Labs reviewed  Flu shot given  Ref for colonoscopy and dm eye exam done  Discussed shingrix vaccine  Enc wt loos and continued lifestyle change Below nl psa

## 2019-03-06 NOTE — Assessment & Plan Note (Signed)
Discussed how this problem influences overall health and the risks it imposes  Reviewed plan for weight loss with lower calorie diet (via better food choices and also portion control or program like weight watchers) and exercise building up to or more than 30 minutes 5 days per week including some aerobic activity    

## 2019-03-06 NOTE — Assessment & Plan Note (Signed)
Fenofibrate- trig 288 Urged to keep working on diet for fats and carbs

## 2019-03-06 NOTE — Assessment & Plan Note (Signed)
1.53 in diabetic with vascular disease who takes metformin  Also taking aleve regularly  Disc nephrotoxic agents -will stop nsaids If no imp will also have to change metformin  Re check renal profile in 3 mo

## 2019-03-06 NOTE — Assessment & Plan Note (Signed)
On less metformin and no glipizide  and fair diet  Lab Results  Component Value Date   HGBA1C 6.8 (H) 02/27/2019   Fair control  Enc wt loss strongly  Good exercise and foot care Ref done for dm eye exam  Will be due for microalb at next visit

## 2019-03-06 NOTE — Assessment & Plan Note (Signed)
Referral done for first screening colonoscopy 

## 2019-03-06 NOTE — Assessment & Plan Note (Signed)
Pt may be due for cardiology f/u He will call to schedule

## 2019-03-06 NOTE — Patient Instructions (Addendum)
If you want a shingrix vaccine (for shingles) - check with your insurance to see if it is covered  Call us to schedule that   Flu shot today   I placed referrals for colonoscopy and eye exam -the office will call you   For cholesterol Avoid red meat/ fried foods/ egg yolks/ fatty breakfast meats/ butter, cheese and high fat dairy/ and shellfish    Do not use any pain medicine over the counter except tylenol (acetaminophen)  The other drugs are hard on kidney and can cause problems with bleeding  Keep drinking water   Stop at check out to schedule follow up for 2 months   Take care of yourself and work on weight loss  Call the cardiology office to see when you need to follow up

## 2019-03-06 NOTE — Assessment & Plan Note (Signed)
Disc goals for lipids and reasons to control them Rev last labs with pt Rev low sat fat diet in detail Low HDL -hereditary/working on exercise LDL good at 20 Trig 288 Taking both statin and fenofibrate  Continues to work on diet  Urged to cut back on red meat

## 2019-03-06 NOTE — Assessment & Plan Note (Signed)
No family hx No symptoms  Lab Results  Component Value Date   PSA 0.09 (L) 02/27/2019

## 2019-03-06 NOTE — Assessment & Plan Note (Signed)
Cbc stable No clinical symptoms

## 2019-04-10 ENCOUNTER — Ambulatory Visit: Payer: 59 | Admitting: Cardiology

## 2019-04-25 LAB — HM DIABETES EYE EXAM

## 2019-04-29 ENCOUNTER — Other Ambulatory Visit: Payer: Self-pay | Admitting: Cardiology

## 2019-05-01 ENCOUNTER — Other Ambulatory Visit: Payer: Self-pay

## 2019-05-01 ENCOUNTER — Ambulatory Visit: Payer: 59 | Admitting: Cardiology

## 2019-05-01 ENCOUNTER — Encounter: Payer: Self-pay | Admitting: Cardiology

## 2019-05-01 VITALS — BP 104/70 | HR 72 | Temp 97.7°F | Ht 72.0 in | Wt 242.0 lb

## 2019-05-01 DIAGNOSIS — E11319 Type 2 diabetes mellitus with unspecified diabetic retinopathy without macular edema: Secondary | ICD-10-CM

## 2019-05-01 DIAGNOSIS — Z9861 Coronary angioplasty status: Secondary | ICD-10-CM

## 2019-05-01 DIAGNOSIS — E785 Hyperlipidemia, unspecified: Secondary | ICD-10-CM | POA: Diagnosis not present

## 2019-05-01 DIAGNOSIS — I2510 Atherosclerotic heart disease of native coronary artery without angina pectoris: Secondary | ICD-10-CM

## 2019-05-01 NOTE — Progress Notes (Signed)
Cardiology Office Note:    Date:  05/01/2019   ID:  Aristotelis, Andrew Burgess 05/01/1971, MRN [id number]  PCP:  Abner Greenspan, MD  Cardiologist:  Dr Stanford Breed Electrophysiologist:  None   Referring MD: Abner Greenspan, MD   No chief complaint on file. One year follow up  History of Present Illness:    Andrew Burgess is a 48 y.o. male with a hx of CAD, s/p NSTEMI 01/18/2016 treated with a RCA DES.  This was followed by a staged thrombectomy and another DES to the RCA 3 days later for residual clot.  Patient also has treated dyslipidemia, non-insulin-dependent diabetes, chronic renal insufficiency 3 AA, and is moderately overweight. His last stress test was Oct 2019 and this was normal.  His most recent LDL was 47 with an HDL of 26-Sept 2020.   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  He does manage to exercise, he says he walks 3-4 times a week.  He is not had chest pain.  He has had some left forearm discomfort but it is not exertional does not sound like angina.  There is been some confusion about his medications.  He still on Brilinta twice daily although Dr. Jacalyn Lefevre phone note from 11/14/2018 indicated the patient could stop Brilinta and go on aspirin.  Plavix was also listed on our medication sheet but the patient says he does not know that medication and and is not taking it.  Past Medical History:  Diagnosis Date  . Allergy   . CAD (coronary artery disease)    a. 01/2016: Peak troponin 23.15. Left heart cath on 01/21/16 showed 100% occluded mid-dist RCA occlusion with heavy thrombus burden s/p DES. Rx asp thrombectomy and post procedure Aggrastat. Re look 01/21/16 revealed persistent thrombus in prox aspect of stent as well as a distal lesion both of which were intervened on successfully with placement of 2 additional DES.  . Diabetes mellitus ([place])   . Hyperglycemia   . Hyperlipidemia    triglyceriedes   . Hypertriglyceridemia   . Obesity    . Sickle cell trait [facility])     Past Surgical History:  Procedure Laterality Date  . ANKLE SURGERY    . CARDIAC CATHETERIZATION N/A 01/18/2016   Procedure: Left Heart Cath and Coronary Angiography;  Surgeon: Andrew Booze, MD;  Location: [place] [facility];  Service: Cardiovascular;  Laterality: N/A;  . CARDIAC CATHETERIZATION N/A 01/18/2016   Procedure: Coronary Stent Intervention;  Surgeon: Andrew Booze, MD;  Location: [place] [facility];  Service: Cardiovascular;  Laterality: N/A;  MID RCA  . CARDIAC CATHETERIZATION N/A 01/21/2016   Procedure: Left Heart Cath and Coronary Angiography;  Surgeon: Andrew Sine, MD;  Location: [place] [facility];  Service: Cardiovascular;  Laterality: N/A;  . CARDIAC CATHETERIZATION N/A 01/21/2016   Procedure: Coronary Stent Intervention;  Surgeon: Andrew Sine, MD;  Location: [place] [facility];  Service: Cardiovascular;  Laterality: N/A;    Current Medications: Current Meds  Medication Sig  . aspirin EC 81 MG tablet Take 81 mg by mouth daily.  [name] atorvastatin (LIPITOR) 80 MG tablet TAKE 1 TABLET BY MOUTH DAILY AT 6 PM  . blood glucose meter kit and supplies Dispense based on patient and insurance preference. Use up to four times daily as directed. (FOR ICD-10 E10.9, E11.9).  . fenofibrate 160 MG tablet Take 1 tablet (160 mg total) by mouth daily.  [name]  metFORMIN (GLUCOPHAGE) 500 MG tablet TAKE 1 TABLET BY MOUTH 2 TIMES DAILY WITH A MEAL.  . metoprolol tartrate (LOPRESSOR) 25 MG tablet TAKE 1 TABLET BY MOUTH TWICE A DAY  . nitroGLYCERIN (NITROSTAT) 0.4 MG SL tablet Place 1 tablet (0.4 mg total) under the tongue every 5 (five) minutes as needed for chest pain (3 DOSES MAX).  [name] omega-3 acid ethyl esters (LOVAZA) 1 g capsule Take 1 capsule (1 g total) by mouth 2 (two) times daily.  . [DISCONTINUED] BRILINTA 90 MG TABS tablet Take 1 tablet (90 mg total) by mouth 2 (two) times daily.  . [DISCONTINUED] clopidogrel (PLAVIX) 75 MG tablet Please specify  directions, refills and quantity     Allergies:   Patient has no known allergies.   Social History   Socioeconomic History  . Marital status: Married    Spouse name: Not on file  . Number of children: 2  . Years of education: Not on file  . Highest education level: Not on file  Occupational History  . Occupation: Truck [employer] operational officer  . Financial resource strain: Not on file  . Food insecurity    Worry: Not on file    Inability: Not on file  . Transportation needs    Medical: Not on file    Non-medical: Not on file  Tobacco Use  . Smoking status: Former Smoker    Types: Cigars    Quit date: 01/18/2012    Years since quitting: 7.2  . Smokeless tobacco: Never Used  Substance and Sexual Activity  . Alcohol use: Yes    Alcohol/week: 0.0 standard drinks    Comment: rare  . Drug use: No  . Sexual activity: Yes  Lifestyle  . Physical activity    Days per week: Not on file    Minutes per session: Not on file  . Stress: Not on file  Relationships  . Social Herbalist on phone: Not on file    Gets together: Not on file    Attends religious service: Not on file    Active member of club or organization: Not on file    Attends meetings of clubs or organizations: Not on file    Relationship status: Not on file  Other Topics Concern  . Not on file  Social History Narrative   Some caffeine      Family History: The patient's family history includes Alcohol abuse in his father; Cancer in his maternal grandmother; Coronary artery disease in his mother; Heart attack in his mother; Hyperlipidemia in his father and mother; Hypertension in his brother and father.  ROS:   Please see the history of present illness.     All other systems reviewed and are negative.  EKGs/Labs/Other Studies Reviewed:    The following studies were reviewed today: Cath/ PCI Aug 2017 GXT Oct 2019  EKG:  EKG is ordered today.  The ekg ordered today demonstrates NSR, no acute  changes  Recent Labs: 02/27/2019: ALT 45; BUN 22; Creatinine, Ser 1.53; Hemoglobin 13.7; Platelets 298.0; Potassium 4.3; Sodium 143; TSH 3.51  Recent Lipid Panel    Component Value Date/Time   CHOL 120 02/27/2019 0805   TRIG 288.0 (H) 02/27/2019 0805   HDL 26.60 (L) 02/27/2019 0805   CHOLHDL 4 02/27/2019 0805   VLDL 57.6 (H) 02/27/2019 0805   LDLCALC 20 07/26/2017 1207   LDLDIRECT 47.0 02/27/2019 0805    Physical Exam:    VS:  BP 104/70   Pulse  72   Temp 97.7 F (36.5 C) (Temporal)   Ht 6' (1.829 m)   Wt 242 lb (109.8 kg)   SpO2 97%   BMI 32.82 kg/m     Wt Readings from Last 3 Encounters:  05/01/19 242 lb (109.8 kg)  03/06/19 242 lb 1 oz (109.8 kg)  05/02/18 239 lb 8 oz (108.6 kg)     GEN:  Overweight AA male, well developed in no acute distress HEENT: Normal NECK: No JVD; No carotid bruits LYMPHATICS: No lymphadenopathy CARDIAC: RRR, no murmurs, rubs, gallops RESPIRATORY:  Clear to auscultation without rales, wheezing or rhonchi  ABDOMEN: Soft, non-tender, non-distended MUSCULOSKELETAL:  No edema; No deformity  SKIN: Warm and dry NEUROLOGIC:  Alert and oriented x 3 PSYCHIATRIC:  Normal affect   ASSESSMENT:    CAD S/P percutaneous coronary angioplasty 01/2016: Peak troponin 23.15. Left heart cath on 01/21/16 showed 100% occluded mid-dist RCA occlusion with heavy thrombus burden s/p DES. Rx asp thrombectomy and post procedure Aggrastat. Re look 01/21/16 revealed persistent thrombus in prox aspect of stent as well as a distal lesion both of which were intervened on successfully with placement of 2 additional DES.  Dyslipidemia, goal LDL below 70 Last LDL was 47, HDL 26- Sept 2020  Type 2 diabetes mellitus with retinopathy without macular edema, without long-term current use of insulin (HCC) On oral agents, followed by PCP  PLAN:    Stop Brilinta- continue ASA 81 mg and other medications- f/u one year.    Medication Adjustments/Labs and Tests Ordered: Current  medicines are reviewed at length with the patient today.  Concerns regarding medicines are outlined above.  Orders Placed This Encounter  Procedures  . EKG 12-Lead   No orders of the defined types were placed in this encounter.   Patient Instructions  Medication Instructions:  STOP Plavix  STOP Brilinta  CONTINUE Aspirin 27m Take 1 tablet once a day  *If you need a refill on your cardiac medications before your next appointment, please call your pharmacy*  Lab Work: None  If you have labs (blood work) drawn today and your tests are completely normal, you will receive your results only by: .Marland KitchenMyChart Message (if you have MyChart) OR . A paper copy in the mail If you have any lab test that is abnormal or we need to change your treatment, we will call you to review the results.  Testing/Procedures: None   Follow-Up: [facility] and [facility] needs are our priority.  As part of our continuing mission to provide you with exceptional heart care, we have created designated Provider Care Teams.  These Care Teams include your primary Cardiologist (physician) and Advanced Practice Providers (APPs -  Physician Assistants and Nurse Practitioners) who all work together to provide you with the care you need, when you need it.  Your next appointment:   12 months  The format for your next appointment:   In Person  Provider:   BKirk Ruths MD  Other Instructions     Signed, LKerin Ransom PA-C  05/01/2019 2:40 PM    [place]

## 2019-05-01 NOTE — Assessment & Plan Note (Signed)
Last LDL was 47, HDL 26- Sept 2020

## 2019-05-01 NOTE — Assessment & Plan Note (Signed)
01/2016: Peak troponin 23.15. Left heart cath on 01/21/16 showed 100% occluded mid-dist RCA occlusion with heavy thrombus burden s/p DES. Rx asp thrombectomy and post procedure Aggrastat. Re look 01/21/16 revealed persistent thrombus in prox aspect of stent as well as a distal lesion both of which were intervened on successfully with placement of 2 additional DES.

## 2019-05-01 NOTE — Patient Instructions (Addendum)
Medication Instructions:  STOP Plavix  STOP Brilinta  CONTINUE Aspirin 81mg  Take 1 tablet once a day  *If you need a refill on your cardiac medications before your next appointment, please call your pharmacy*  Lab Work: None  If you have labs (blood work) drawn today and your tests are completely normal, you will receive your results only by: [name] MyChart Message (if you have MyChart) OR . A paper copy in the mail If you have any lab test that is abnormal or we need to change your treatment, we will call you to review the results.  Testing/Procedures: None   Follow-Up: At [facility], you and your health needs are our priority.  As part of our continuing mission to provide you with exceptional heart care, we have created designated Provider Care Teams.  These Care Teams include your primary Cardiologist (physician) and Advanced Practice Providers (APPs -  Physician Assistants and Nurse Practitioners) who all work together to provide you with the care you need, when you need it.  Your next appointment:   12 months  The format for your next appointment:   In Person  Provider:   Kirk Ruths, MD  Other Instructions

## 2019-05-01 NOTE — Assessment & Plan Note (Signed)
On oral agents, followed by PCP

## 2019-05-02 ENCOUNTER — Encounter: Payer: Self-pay | Admitting: Family Medicine

## 2019-05-29 ENCOUNTER — Other Ambulatory Visit (INDEPENDENT_AMBULATORY_CARE_PROVIDER_SITE_OTHER): Payer: 59

## 2019-05-29 DIAGNOSIS — E11319 Type 2 diabetes mellitus with unspecified diabetic retinopathy without macular edema: Secondary | ICD-10-CM | POA: Diagnosis not present

## 2019-05-29 DIAGNOSIS — R7989 Other specified abnormal findings of blood chemistry: Secondary | ICD-10-CM

## 2019-05-29 LAB — RENAL FUNCTION PANEL
Albumin: 4.5 g/dL (ref 3.5–5.2)
BUN: 17 mg/dL (ref 6–23)
CO2: 25 mEq/L (ref 19–32)
Calcium: 9 mg/dL (ref 8.4–10.5)
Chloride: 106 mEq/L (ref 96–112)
Creatinine, Ser: 1.43 mg/dL (ref 0.40–1.50)
GFR: 63.25 mL/min (ref >60.00)
Glucose, Bld: 189 mg/dL — ABNORMAL HIGH (ref 70–99)
Phosphorus: 2.5 mg/dL (ref 2.3–4.6)
Potassium: 3.9 mEq/L (ref 3.5–5.1)
Sodium: 139 mEq/L (ref 135–145)

## 2019-05-29 LAB — HEMOGLOBIN A1C: Hgb A1c MFr Bld: 5.9 % (ref 4.6–6.5)

## 2019-06-05 ENCOUNTER — Telehealth: Payer: Self-pay

## 2019-06-05 ENCOUNTER — Ambulatory Visit (INDEPENDENT_AMBULATORY_CARE_PROVIDER_SITE_OTHER): Payer: 59 | Admitting: Family Medicine

## 2019-06-05 ENCOUNTER — Ambulatory Visit: Payer: 59 | Admitting: Family Medicine

## 2019-06-05 ENCOUNTER — Encounter: Payer: Self-pay | Admitting: Family Medicine

## 2019-06-05 DIAGNOSIS — E11319 Type 2 diabetes mellitus with unspecified diabetic retinopathy without macular edema: Secondary | ICD-10-CM

## 2019-06-05 DIAGNOSIS — Z20828 Contact with and (suspected) exposure to other viral communicable diseases: Secondary | ICD-10-CM | POA: Diagnosis not present

## 2019-06-05 DIAGNOSIS — R7989 Other specified abnormal findings of blood chemistry: Secondary | ICD-10-CM | POA: Diagnosis not present

## 2019-06-05 DIAGNOSIS — Z20822 Contact with and (suspected) exposure to covid-19: Secondary | ICD-10-CM

## 2019-06-05 HISTORY — DX: Contact with and (suspected) exposure to covid-19: Z20.822

## 2019-06-05 NOTE — Telephone Encounter (Signed)
Pt walked in for 8 AM appt with dry cough for 1 wk; pt just traveled to tournament events in [facility] this past weekend. No other covid symptoms; pt wanted to know if he needed covid testing. Pt scheduled virtual visit today at 2pm with Dr Glori Bickers. UC& ED precautions given and pt voiced understanding.

## 2019-06-05 NOTE — Assessment & Plan Note (Addendum)
Lab Results  Component Value Date   HGBA1C 5.9 05/29/2019   Much improved Better diet and exercise  Enc to keep it up  Continue metformin  Currently on a statin  Needs microalb at next visit F/u 6 mo

## 2019-06-05 NOTE — Telephone Encounter (Signed)
covid testing info given to pt and he will keep her virtual appt

## 2019-06-05 NOTE — Progress Notes (Signed)
Virtual Visit via Video Note  I connected with Andrew Burgess on 06/05/19 at  2:00 PM EST by a video enabled telemedicine application and verified that I am speaking with the correct person using two identifiers.  Location: Patient: home Provider: office    I discussed the limitations of evaluation and management by telemedicine and the availability of in person appointments. The patient expressed understanding and agreed to proceed.  Parties involved in encounter  Patient: Andrew Burgess   Provider:  Loura Pardon MD    History of Present Illness: Pt presents for f/u of chronic medical problems  Had a little cough so changed to virtual visit  Has been exposed to covid and interested in testing    Weight last 237 lb Wt Readings from Last 3 Encounters:  05/01/19 242 lb (109.8 kg)  03/06/19 242 lb 1 oz (109.8 kg)  05/02/18 239 lb 8 oz (108.6 kg)   Good   DM2 Lab Results  Component Value Date   HGBA1C 5.9 05/29/2019  good control -down from 6.8  Exercising more  Walking  Trying to eat better - avoiding fast food  More vegetables / more soup    Last visit cr was up as well to 1.53 Lab Results  Component Value Date   CREATININE 1.43 05/29/2019   BUN 17 05/29/2019   NA 139 05/29/2019   K 3.9 05/29/2019   CL 106 05/29/2019   CO2 25 05/29/2019  now back in the normal range  GFR is 63.2  He stopped taking protein as well  No nsaids   Wants to get a covid test  Family member is positive with mild symptoms  He tends to cough in am with cold air  Used cough drops No fever  No loss of taste or smell   Patient Active Problem List   Diagnosis Date Noted  . Exposure to COVID-19 virus 06/05/2019  . Colon cancer screening 03/06/2019  . Prostate cancer screening 10/23/2018  . Elevated serum creatinine 05/02/2018  . Family history of coronary artery disease 02/17/2018  . Obesity (BMI 30-39.9) 10/25/2017  . Sickle cell trait ([place]) 07/26/2017  . Routine general  medical examination at a health care facility 11/08/2016  . Low testosterone in male 06/02/2016  . Low libido 05/22/2016  . CAD S/P percutaneous coronary angioplasty   . Dyslipidemia, goal LDL below 70   . Type 2 diabetes mellitus with retinopathy without macular edema, without long-term current use of insulin ([place])   . Status post coronary artery stent placement   . Acute MI inferior lateral first episode care ([place]) 01/18/2016  . Screen for STD (sexually transmitted disease) 01/12/2011  . ALLERGIC RHINITIS 11/11/2007  . Hypertriglyceridemia 03/04/2007   Past Medical History:  Diagnosis Date  . Allergy   . CAD (coronary artery disease)    a. 01/2016: Peak troponin 23.15. Left heart cath on 01/21/16 showed 100% occluded mid-dist RCA occlusion with heavy thrombus burden s/p DES. Rx asp thrombectomy and post procedure Aggrastat. Re look 01/21/16 revealed persistent thrombus in prox aspect of stent as well as a distal lesion both of which were intervened on successfully with placement of 2 additional DES.  . Diabetes mellitus ([place])   . Hyperglycemia   . Hyperlipidemia    triglyceriedes   . Hypertriglyceridemia   . Obesity   . Sickle cell trait [facility])    Past Surgical History:  Procedure Laterality Date  . ANKLE SURGERY    . CARDIAC CATHETERIZATION N/A 01/18/2016  Procedure: Left Heart Cath and Coronary Angiography;  Surgeon: Jettie Booze, MD;  Location: [place] [facility];  Service: Cardiovascular;  Laterality: N/A;  . CARDIAC CATHETERIZATION N/A 01/18/2016   Procedure: Coronary Stent Intervention;  Surgeon: Jettie Booze, MD;  Location: [place] [facility];  Service: Cardiovascular;  Laterality: N/A;  MID RCA  . CARDIAC CATHETERIZATION N/A 01/21/2016   Procedure: Left Heart Cath and Coronary Angiography;  Surgeon: Troy Sine, MD;  Location: [place] [facility];  Service: Cardiovascular;  Laterality: N/A;  . CARDIAC CATHETERIZATION N/A 01/21/2016   Procedure: Coronary Stent  Intervention;  Surgeon: Troy Sine, MD;  Location: [place] [facility];  Service: Cardiovascular;  Laterality: N/A;   Social History   Tobacco Use  . Smoking status: Former Smoker    Types: Cigars    Quit date: 01/18/2012    Years since quitting: 7.3  . Smokeless tobacco: Never Used  Substance Use Topics  . Alcohol use: Yes    Alcohol/week: 0.0 standard drinks    Comment: rare  . Drug use: No   Family History  Problem Relation Age of Onset  . Coronary artery disease Mother   . Heart attack Mother   . Hyperlipidemia Mother   . Alcohol abuse Father   . Hyperlipidemia Father   . Hypertension Father   . Hypertension Brother   . Cancer Maternal Grandmother        brain   No Known Allergies Current Outpatient Medications on File Prior to Visit  Medication Sig Dispense Refill  . aspirin EC 81 MG tablet Take 81 mg by mouth daily.    [name] atorvastatin (LIPITOR) 80 MG tablet TAKE 1 TABLET BY MOUTH DAILY AT 6 PM 90 tablet 0  . blood glucose meter kit and supplies Dispense based on patient and insurance preference. Use up to four times daily as directed. (FOR ICD-10 E10.9, E11.9). 1 each 0  . fenofibrate 160 MG tablet Take 1 tablet (160 mg total) by mouth daily. 90 tablet 3  . metFORMIN (GLUCOPHAGE) 500 MG tablet TAKE 1 TABLET BY MOUTH 2 TIMES DAILY WITH A MEAL. 180 tablet 3  . metoprolol tartrate (LOPRESSOR) 25 MG tablet TAKE 1 TABLET BY MOUTH TWICE A DAY 60 tablet 6  . nitroGLYCERIN (NITROSTAT) 0.4 MG SL tablet Place 1 tablet (0.4 mg total) under the tongue every 5 (five) minutes as needed for chest pain (3 DOSES MAX). 25 tablet 2  . omega-3 acid ethyl esters (LOVAZA) 1 g capsule Take 1 capsule (1 g total) by mouth 2 (two) times daily. 180 capsule 3   No current facility-administered medications on file prior to visit.   Review of Systems  Constitutional: Negative for chills, fever and malaise/fatigue.  HENT: Negative for congestion, ear pain, sinus pain and sore throat.   Eyes:  Negative for blurred vision, discharge and redness.  Respiratory: Positive for cough. Negative for sputum production, shortness of breath, wheezing and stridor.   Cardiovascular: Negative for chest pain, palpitations and leg swelling.  Gastrointestinal: Negative for abdominal pain, diarrhea, nausea and vomiting.  Musculoskeletal: Negative for myalgias.  Skin: Negative for rash.  Neurological: Negative for dizziness and headaches.  Psychiatric/Behavioral:       Mood is ok     Observations/Objective: Patient appears well, in no distress Weight is baseline  No facial swelling or asymmetry Normal voice-not hoarse and no slurred speech No obvious tremor or mobility impairment Moving neck and UEs normally Able to hear the call well  No cough or shortness of breath during interview  Talkative and mentally sharp with no cognitive changes No skin changes on face or neck , no rash or pallor Affect is normal    Assessment and Plan: Problem List Items Addressed This Visit      Endocrine   Type 2 diabetes mellitus with retinopathy without macular edema, without long-term current use of insulin (HCC) - Primary    Lab Results  Component Value Date   HGBA1C 5.9 05/29/2019   Much improved Better diet and exercise  Enc to keep it up  Continue metformin  Currently on a statin  Needs microalb at next visit F/u 6 mo        Other   Elevated serum creatinine    Improved with avoidance of nsaid / inc water  He also decreased his protein supplementation Lab Results  Component Value Date   CREATININE 1.43 05/29/2019   GFR in nl range for lab reference Enc to keep drinking water      Exposure to COVID-19 virus    Close exposure in the home  Pt wants to be tested- he could not get any of the appt methods to work I sent a message to the [facility] testing pool to call him He will continue to isolate until then            Follow Up Instructions: I would like to get you tested for  covid I will work on that since you did not have luck contacting the testing center   A1C is improved Kidney numbers are improved    I discussed the assessment and treatment plan with the patient. The patient was provided an opportunity to ask questions and all were answered. The patient agreed with the plan and demonstrated an understanding of the instructions.   The patient was advised to call back or seek an in-person evaluation if the symptoms worsen or if the condition fails to improve as anticipated.     Loura Pardon, MD

## 2019-06-05 NOTE — Assessment & Plan Note (Signed)
Close exposure in the home  Pt wants to be tested- he could not get any of the appt methods to work I sent a message to the [facility] testing pool to call him He will continue to isolate until then

## 2019-06-05 NOTE — Telephone Encounter (Signed)
I will see him then Please give him info re: covid testing so he can schedule an appt to be tested in the meantime (if he agrees to it)  Thanks

## 2019-06-05 NOTE — Patient Instructions (Addendum)
I would like to get you tested for covid I will work on that since you did not have luck contacting the testing center   A1C is improved Kidney numbers are improved

## 2019-06-05 NOTE — Assessment & Plan Note (Signed)
Improved with avoidance of nsaid / inc water  He also decreased his protein supplementation Lab Results  Component Value Date   CREATININE 1.43 05/29/2019   GFR in nl range for lab reference Enc to keep drinking water

## 2019-06-30 ENCOUNTER — Other Ambulatory Visit: Payer: Self-pay | Admitting: Cardiology

## 2019-09-29 ENCOUNTER — Other Ambulatory Visit: Payer: Self-pay | Admitting: Cardiology

## 2019-10-02 LAB — HM DIABETES EYE EXAM

## 2019-10-03 ENCOUNTER — Encounter: Payer: Self-pay | Admitting: Family Medicine

## 2020-01-12 ENCOUNTER — Telehealth: Payer: Self-pay

## 2020-01-12 NOTE — Telephone Encounter (Signed)
Pt left v/m that [facility] advised pt to call about refill that our office was not responding to. Pt did not leave name of med. I do not see refill request in pts chart. I called [facility] [address] and spoke with Ebony Hail and she said refill request is for Meloxicam prescribed by Dr Marchia Bond. I called pt and he will ck with Dr Luanna Cole office about status of refill. Nothing further needed from our office.

## 2020-03-01 ENCOUNTER — Other Ambulatory Visit: Payer: Self-pay | Admitting: Family Medicine

## 2020-03-11 ENCOUNTER — Encounter: Payer: Self-pay | Admitting: Cardiology

## 2020-03-11 ENCOUNTER — Other Ambulatory Visit: Payer: Self-pay | Admitting: *Deleted

## 2020-03-11 ENCOUNTER — Telehealth: Payer: Self-pay | Admitting: *Deleted

## 2020-03-11 ENCOUNTER — Other Ambulatory Visit (HOSPITAL_COMMUNITY)
Admission: RE | Admit: 2020-03-11 | Discharge: 2020-03-11 | Disposition: A | Discharge status: Home / Self Care | Payer: 59 | Source: Ambulatory Visit | Attending: Cardiology | Admitting: Cardiology

## 2020-03-11 DIAGNOSIS — Z01812 Encounter for preprocedural laboratory examination: Secondary | ICD-10-CM | POA: Insufficient documentation

## 2020-03-11 DIAGNOSIS — Z20822 Contact with and (suspected) exposure to covid-19: Secondary | ICD-10-CM | POA: Insufficient documentation

## 2020-03-11 DIAGNOSIS — E11319 Type 2 diabetes mellitus with unspecified diabetic retinopathy without macular edema: Secondary | ICD-10-CM

## 2020-03-11 DIAGNOSIS — Z9861 Coronary angioplasty status: Secondary | ICD-10-CM

## 2020-03-11 DIAGNOSIS — E785 Hyperlipidemia, unspecified: Secondary | ICD-10-CM

## 2020-03-11 LAB — SARS CORONAVIRUS 2 (TAT 6-24 HRS): SARS Coronavirus 2: NEGATIVE

## 2020-03-11 NOTE — Telephone Encounter (Signed)
I ordered a1c and bmet and lipids-due for all  Fasting pleaes

## 2020-03-11 NOTE — Telephone Encounter (Signed)
Patient left a voicemail stating that he was hoping to get his A1C checked this week. Patient requested this be ordered and he was hoping to get this scheduled for Friday.

## 2020-03-11 NOTE — Telephone Encounter (Signed)
I left a message on patient's voice mail to return my call.  Please schedule fasting lab on Friday.

## 2020-03-11 NOTE — Telephone Encounter (Signed)
Pt called wanting to schedule labs for a1c for Friday.  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  They told him to call his PCP  Need order

## 2020-03-12 ENCOUNTER — Telehealth (HOSPITAL_COMMUNITY): Payer: Self-pay | Admitting: *Deleted

## 2020-03-12 NOTE — Telephone Encounter (Signed)
Close encounter 

## 2020-03-13 ENCOUNTER — Ambulatory Visit (HOSPITAL_COMMUNITY)
Admission: RE | Admit: 2020-03-13 | Discharge: 2020-03-13 | Disposition: A | Discharge status: Home / Self Care | Payer: 59 | Source: Ambulatory Visit | Attending: Cardiovascular Disease | Admitting: Cardiovascular Disease

## 2020-03-13 ENCOUNTER — Other Ambulatory Visit: Payer: Self-pay

## 2020-03-13 ENCOUNTER — Ambulatory Visit (HOSPITAL_BASED_OUTPATIENT_CLINIC_OR_DEPARTMENT_OTHER): Payer: 59

## 2020-03-13 DIAGNOSIS — Z9861 Coronary angioplasty status: Secondary | ICD-10-CM | POA: Diagnosis not present

## 2020-03-13 DIAGNOSIS — I2510 Atherosclerotic heart disease of native coronary artery without angina pectoris: Secondary | ICD-10-CM | POA: Diagnosis not present

## 2020-03-13 LAB — ECHOCARDIOGRAM COMPLETE
Area-P 1/2: 3.72 cm2
S' Lateral: 2.8 cm

## 2020-03-13 LAB — EXERCISE TOLERANCE TEST
Estimated workload: 11.7 METS
Exercise duration (min): 10 min
Exercise duration (sec): 8 s
MPHR: 169 {beats}/min
Peak HR: 160 {beats}/min
Percent HR: 94 %
Rest HR: 63 {beats}/min

## 2020-03-14 ENCOUNTER — Encounter: Payer: Self-pay | Admitting: Cardiology

## 2020-03-14 ENCOUNTER — Ambulatory Visit (INDEPENDENT_AMBULATORY_CARE_PROVIDER_SITE_OTHER): Payer: 59 | Admitting: Cardiology

## 2020-03-14 VITALS — BP 116/74 | HR 75 | Ht 72.0 in | Wt 235.6 lb

## 2020-03-14 DIAGNOSIS — Z9861 Coronary angioplasty status: Secondary | ICD-10-CM

## 2020-03-14 DIAGNOSIS — I2510 Atherosclerotic heart disease of native coronary artery without angina pectoris: Secondary | ICD-10-CM

## 2020-03-14 DIAGNOSIS — E11319 Type 2 diabetes mellitus with unspecified diabetic retinopathy without macular edema: Secondary | ICD-10-CM | POA: Diagnosis not present

## 2020-03-14 DIAGNOSIS — E785 Hyperlipidemia, unspecified: Secondary | ICD-10-CM | POA: Diagnosis not present

## 2020-03-14 NOTE — Patient Instructions (Signed)
Medication Instructions:  Continue current medications  *If you need a refill on your cardiac medications before your next appointment, please call your pharmacy*   Lab Work: None ordered   Testing/Procedures: None Ordered   Follow-Up: At [facility], you and your health needs are our priority.  As part of our continuing mission to provide you with exceptional heart care, we have created designated Provider Care Teams.  These Care Teams include your primary Cardiologist (physician) and Advanced Practice Providers (APPs -  Physician Assistants and Nurse Practitioners) who all work together to provide you with the care you need, when you need it.  We recommend signing up for the patient portal called "MyChart".  Sign up information is provided on this After Visit Summary.  MyChart is used to connect with patients for Virtual Visits (Telemedicine).  Patients are able to view lab/test results, encounter notes, upcoming appointments, etc.  Non-urgent messages can be sent to your provider as well.   To learn more about what you can do with MyChart, go to [email or web address].    Your next appointment:   1 year(s)   The format for your next appointment:   In Person  Provider:   You may see Kirk Ruths, MD or one of the following Advanced Practice Providers on your designated Care Team:    Kerin Ransom, PA-C  [place], [place]  Coletta Memos, [place]

## 2020-03-14 NOTE — Progress Notes (Signed)
Cardiology Office Note:    Date:  03/14/2020   ID:  Andrew, Burgess 13-Feb-1971, MRN [id number]  PCP:  Abner Greenspan, MD  Cardiologist:  No primary care provider on file.  Electrophysiologist:  None   Referring MD: Abner Greenspan, MD   No chief complaint on file.   History of Present Illness:    Andrew Burgess is a pleasant 49 y.o. male with a hx CAD, status post NSTEMI August 2017 treated with an RCA DES followed by a staged thrombectomy and another DES to the distal RCA 3 days later for residual clot.  Other medical issues include treated dyslipidemia and non-insulin-dependent diabetes.  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  He had a stress test yesterday and an echocardiogram.  Both were normal.  The patient denies any unusual chest pain or shortness of breath.  He denies any tachycardia or palpitations.  He is exercising using an exercise bike or walking at least 3 to 5 days a week.  He has lost some weight.  He denies any poor sleep habits or daytime fatigue.  Past Medical History:  Diagnosis Date  . Allergy   . CAD (coronary artery disease)    a. 01/2016: Peak troponin 23.15. Left heart cath on 01/21/16 showed 100% occluded mid-dist RCA occlusion with heavy thrombus burden s/p DES. Rx asp thrombectomy and post procedure Aggrastat. Re look 01/21/16 revealed persistent thrombus in prox aspect of stent as well as a distal lesion both of which were intervened on successfully with placement of 2 additional DES.  . Diabetes mellitus ([place])   . Hyperglycemia   . Hyperlipidemia    triglyceriedes   . Hypertriglyceridemia   . Obesity   . Sickle cell trait [facility])     Past Surgical History:  Procedure Laterality Date  . ANKLE SURGERY    . CARDIAC CATHETERIZATION N/A 01/18/2016   Procedure: Left Heart Cath and Coronary Angiography;  Surgeon: Jettie Booze, MD;  Location: [place] [facility];  Service: Cardiovascular;  Laterality: N/A;  . CARDIAC  CATHETERIZATION N/A 01/18/2016   Procedure: Coronary Stent Intervention;  Surgeon: Jettie Booze, MD;  Location: [place] [facility];  Service: Cardiovascular;  Laterality: N/A;  MID RCA  . CARDIAC CATHETERIZATION N/A 01/21/2016   Procedure: Left Heart Cath and Coronary Angiography;  Surgeon: Troy Sine, MD;  Location: [place] [facility];  Service: Cardiovascular;  Laterality: N/A;  . CARDIAC CATHETERIZATION N/A 01/21/2016   Procedure: Coronary Stent Intervention;  Surgeon: Troy Sine, MD;  Location: [place] [facility];  Service: Cardiovascular;  Laterality: N/A;    Current Medications: Current Meds  Medication Sig  . aspirin EC 81 MG tablet Take 81 mg by mouth daily.  [name] atorvastatin (LIPITOR) 80 MG tablet TAKE 1 TABLET BY MOUTH DAILY AT 6 PM  . blood glucose meter kit and supplies Dispense based on patient and insurance preference. Use up to four times daily as directed. (FOR ICD-10 E10.9, E11.9).  . fenofibrate 160 MG tablet Take 1 tablet (160 mg total) by mouth daily.  . metFORMIN (GLUCOPHAGE) 500 MG tablet TAKE 1 TABLET BY MOUTH 2 TIMES DAILY WITH A MEAL.  . metoprolol tartrate (LOPRESSOR) 25 MG tablet TAKE 1 TABLET BY MOUTH TWICE A DAY  . nitroGLYCERIN (NITROSTAT) 0.4 MG SL tablet Place 1 tablet (0.4 mg total) under the tongue every 5 (five) minutes as needed for chest pain (3 DOSES MAX).  [name]  omega-3 acid ethyl esters (LOVAZA) 1 g capsule TAKE 1 CAPSULE BY MOUTH 2 TIMES DAILY     Allergies:   Patient has no known allergies.   Social History   Socioeconomic History  . Marital status: Married    Spouse name: Not on file  . Number of children: 2  . Years of education: Not on file  . Highest education level: Not on file  Occupational History  . Occupation: Truck Geophysicist/field seismologist  Tobacco Use  . Smoking status: Former Smoker    Types: Cigars    Quit date: 01/18/2012    Years since quitting: 8.1  . Smokeless tobacco: Never Used  Substance and Sexual Activity  . Alcohol use: Yes     Alcohol/week: 0.0 standard drinks    Comment: rare  . Drug use: No  . Sexual activity: Yes  Other Topics Concern  . Not on file  Social History Narrative   Some caffeine    Social Determinants of Health   Financial Resource Strain:   . Difficulty of Paying Living Expenses: Not on file  Food Insecurity:   . Worried About Charity fundraiser in the Last Year: Not on file  . Ran Out of Food in the Last Year: Not on file  Transportation Needs:   . Lack of Transportation (Medical): Not on file  . Lack of Transportation (Non-Medical): Not on file  Physical Activity:   . Days of Exercise per Week: Not on file  . Minutes of Exercise per Session: Not on file  Stress:   . Feeling of Stress : Not on file  Social Connections:   . Frequency of Communication with Friends and Family: Not on file  . Frequency of Social Gatherings with Friends and Family: Not on file  . Attends Religious Services: Not on file  . Active Member of Clubs or Organizations: Not on file  . Attends Archivist Meetings: Not on file  . Marital Status: Not on file     Family History: The patient's family history includes Alcohol abuse in his father; Cancer in his maternal grandmother; Coronary artery disease in his mother; Heart attack in his mother; Hyperlipidemia in his father and mother; Hypertension in his brother and father.  ROS:   Please see the history of present illness.     All other systems reviewed and are negative.  EKGs/Labs/Other Studies Reviewed:    The following studies were reviewed today: GXT and Echo 03/13/2020  EKG:  EKG is ordered today.  The ekg ordered today demonstrates NSR, HR 75  Recent Labs: 05/29/2019: BUN 17; Creatinine, Ser 1.43; Potassium 3.9; Sodium 139  Recent Lipid Panel    Component Value Date/Time   CHOL 120 02/27/2019 0805   TRIG 288.0 (H) 02/27/2019 0805   HDL 26.60 (L) 02/27/2019 0805   CHOLHDL 4 02/27/2019 0805   VLDL 57.6 (H) 02/27/2019 0805   LDLCALC  20 07/26/2017 1207   LDLDIRECT 47.0 02/27/2019 0805    Physical Exam:    VS:  BP 116/74   Pulse 75   Ht 6' (1.829 m)   Wt 235 lb 9.6 oz (106.9 kg)   SpO2 94%   BMI 31.95 kg/m     Wt Readings from Last 3 Encounters:  03/14/20 235 lb 9.6 oz (106.9 kg)  05/01/19 242 lb (109.8 kg)  03/06/19 242 lb 1 oz (109.8 kg)     GEN:  Well nourished, well developed AA male in no acute distress HEENT: Normal NECK: No  JVD; No carotid bruits CARDIAC: RRR, no murmurs, rubs, gallops RESPIRATORY:  Clear to auscultation without rales, wheezing or rhonchi  ABDOMEN: Soft, non-tender, non-distended MUSCULOSKELETAL:  No edema; No deformity  SKIN: Warm and dry NEUROLOGIC:  Alert and oriented x 3 PSYCHIATRIC:  Normal affect   ASSESSMENT:    CAD S/P percutaneous coronary angioplasty 01/2016: Peak troponin 23.15. Left heart cath on 01/21/16 showed 100% occluded mid-dist RCA occlusion with heavy thrombus burden s/p DES. Rx asp thrombectomy and post procedure Aggrastat. Re look 01/21/16 revealed persistent thrombus in prox aspect of stent as well as a distal lesion both of which were intervened on successfully with placement of 2 additional DES. He has done well since.  Stress test and echo WNL yesterday.  From a cardiac standpoint no reason not to renew his DOT license.   Dyslipidemia, goal LDL below 70 Last LDL was 47, HDL 26- Sept 2020  Type 2 diabetes mellitus with retinopathy without macular edema, without long-term current use of insulin (HCC) On oral agents, followed by PCP   PLAN:    Same Rx- f/u Dr Stanford Breed in a year.  I wonder if Vania Rea may be better for him than Glucophage in light of his CAD- will discuss with Dr Stanford Breed and defer decision to Dr Glori Bickers.    Medication Adjustments/Labs and Tests Ordered: Current medicines are reviewed at length with the patient today.  Concerns regarding medicines are outlined above.  No orders of the defined types were placed in this encounter.  No  orders of the defined types were placed in this encounter.   There are no Patient Instructions on file for this visit.   Signed, Kerin Ransom, PA-C  03/14/2020 3:31 PM    [facility]

## 2020-03-15 ENCOUNTER — Other Ambulatory Visit (INDEPENDENT_AMBULATORY_CARE_PROVIDER_SITE_OTHER): Payer: 59

## 2020-03-15 ENCOUNTER — Other Ambulatory Visit: Payer: Self-pay

## 2020-03-15 DIAGNOSIS — E785 Hyperlipidemia, unspecified: Secondary | ICD-10-CM | POA: Diagnosis not present

## 2020-03-15 DIAGNOSIS — E11319 Type 2 diabetes mellitus with unspecified diabetic retinopathy without macular edema: Secondary | ICD-10-CM

## 2020-03-15 LAB — BASIC METABOLIC PANEL
BUN: 19 mg/dL (ref 6–23)
CO2: 26 mEq/L (ref 19–32)
Calcium: 9.1 mg/dL (ref 8.4–10.5)
Chloride: 105 mEq/L (ref 96–112)
Creatinine, Ser: 1.51 mg/dL — ABNORMAL HIGH (ref 0.40–1.50)
GFR: 59.21 mL/min — ABNORMAL LOW (ref >60.00)
Glucose, Bld: 158 mg/dL — ABNORMAL HIGH (ref 70–99)
Potassium: 4.3 mEq/L (ref 3.5–5.1)
Sodium: 139 mEq/L (ref 135–145)

## 2020-03-15 LAB — LIPID PANEL
Cholesterol: 105 mg/dL (ref 0–200)
HDL: 22.9 mg/dL — ABNORMAL LOW (ref >39.00)
NonHDL: 82.5
Total CHOL/HDL Ratio: 5
Triglycerides: 237 mg/dL — ABNORMAL HIGH (ref 0.0–149.0)
VLDL: 47.4 mg/dL — ABNORMAL HIGH (ref 0.0–40.0)

## 2020-03-15 LAB — LDL CHOLESTEROL, DIRECT: Direct LDL: 43 mg/dL

## 2020-03-15 LAB — HEMOGLOBIN A1C: Hgb A1c MFr Bld: 7.5 % — ABNORMAL HIGH (ref 4.6–6.5)

## 2020-03-15 NOTE — Telephone Encounter (Signed)
Labs are done-see result note  Please print copy for him He needs a f/u also

## 2020-03-15 NOTE — Telephone Encounter (Signed)
Patient had lab work done today.  Patient's requesting a response on my chart when the results are in. Patient would like to pick up results.  Patient needs to take the results to the urgent care where he had his DOT physical.

## 2020-03-15 NOTE — Telephone Encounter (Signed)
Pt viewed results on mychart, he has picked up copy of labs and scheduled his f/u with PCP

## 2020-03-20 ENCOUNTER — Other Ambulatory Visit: Payer: Self-pay

## 2020-03-20 ENCOUNTER — Encounter: Payer: Self-pay | Admitting: Family Medicine

## 2020-03-20 ENCOUNTER — Ambulatory Visit: Payer: 59 | Admitting: Family Medicine

## 2020-03-20 VITALS — BP 128/78 | HR 66 | Temp 97.0°F | Ht 72.0 in | Wt 240.1 lb

## 2020-03-20 DIAGNOSIS — E669 Obesity, unspecified: Secondary | ICD-10-CM

## 2020-03-20 DIAGNOSIS — E785 Hyperlipidemia, unspecified: Secondary | ICD-10-CM

## 2020-03-20 DIAGNOSIS — R7989 Other specified abnormal findings of blood chemistry: Secondary | ICD-10-CM | POA: Diagnosis not present

## 2020-03-20 DIAGNOSIS — Z23 Encounter for immunization: Secondary | ICD-10-CM

## 2020-03-20 DIAGNOSIS — E11319 Type 2 diabetes mellitus with unspecified diabetic retinopathy without macular edema: Secondary | ICD-10-CM

## 2020-03-20 LAB — MICROALBUMIN / CREATININE URINE RATIO
Creatinine,U: 118.8 mg/dL
Microalb Creat Ratio: 2.7 mg/g (ref 0.0–30.0)
Microalb, Ur: 3.2 mg/dL — ABNORMAL HIGH (ref 0.0–1.9)

## 2020-03-20 MED ORDER — EMPAGLIFLOZIN 10 MG PO TABS: 10.0000 mg | ORAL_TABLET | Freq: Every day | ORAL | 3 refills | Status: DC

## 2020-03-20 NOTE — Assessment & Plan Note (Signed)
Discussed how this problem influences overall health and the risks it imposes  Reviewed plan for weight loss with lower calorie diet (via better food choices and also portion control or program like weight watchers) and exercise building up to or more than 30 minutes 5 days per week including some aerobic activity    

## 2020-03-20 NOTE — Assessment & Plan Note (Signed)
Taking atorvastatin and also fenofibrate with lovaza for triglycerides and low HDL CAD- followed by cardiology  Enc to cut back on beef and other fatty meats as well as fried food Disc goals for lipids and reasons to control them Rev last labs with pt Rev low sat fat diet in detail  Very low LDL of 20  Trig down to 237 and HDL still low at 22.9

## 2020-03-20 NOTE — Assessment & Plan Note (Signed)
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

## 2020-03-20 NOTE — Progress Notes (Signed)
Subjective:    Patient ID: Andrew Burgess, male    DOB: [birth year], 49 y.o.   MRN: [id number]  This visit occurred during the SARS-CoV-2 public health emergency.  Safety protocols were in place, including screening questions prior to the visit, additional usage of staff PPE, and extensive cleaning of exam room while observing appropriate contact time as indicated for disinfecting solutions.    HPI Pt presents for f/u of chronic health problems   Wt Readings from Last 3 Encounters:  03/20/20 240 lb 1 oz (108.9 kg)  03/14/20 235 lb 9.6 oz (106.9 kg)  05/01/19 242 lb (109.8 kg)   32.56 kg/m   Still exercising -bike and walking (brisk) -would like to run  4-5 days per week 30-60 minutes   Eating is not optimal  He does eat well  Cannot get in to diabetic teaching due to schedule  Drinking some mt dew when he is tired  Not a lot of sweets    Wants flu shot today   covid status   DM2 Recent labs showed A1c of 7.5  Glucose of 158 Last one was 5.9  Metformin  On a statin and fenofibrate and lovaza  Taking metformin 500 mg bid  Due for microalbumin test    H/o CAD BP Readings from Last 3 Encounters:  03/20/20 128/78  03/14/20 116/74  05/01/19 104/70   Pulse Readings from Last 3 Encounters:  03/20/20 66  03/14/20 75  05/01/19 72   Elevated cr  Lab Results  Component Value Date   CREATININE 1.51 (H) 03/15/2020   BUN 19 03/15/2020   NA 139 03/15/2020   K 4.3 03/15/2020   CL 105 03/15/2020   CO2 26 03/15/2020  this is up from 1.43  GFR is 59.2    Lab Results  Component Value Date   CHOL 105 03/15/2020   CHOL 120 02/27/2019   CHOL 118 04/25/2018   Lab Results  Component Value Date   HDL 22.90 (L) 03/15/2020   HDL 26.60 (L) 02/27/2019   HDL 27.60 (L) 04/25/2018   Lab Results  Component Value Date   LDLCALC 20 07/26/2017   LDLCALC 46 05/22/2016   LDLCALC UNABLE TO CALCULATE IF TRIGLYCERIDE OVER 400 mg/dL 01/21/2016   Lab Results  Component  Value Date   TRIG 237.0 (H) 03/15/2020   TRIG 288.0 (H) 02/27/2019   TRIG 265.0 (H) 04/25/2018   Lab Results  Component Value Date   CHOLHDL 5 03/15/2020   CHOLHDL 4 02/27/2019   CHOLHDL 4 04/25/2018   Lab Results  Component Value Date   LDLDIRECT 43.0 03/15/2020   LDLDIRECT 47.0 02/27/2019   LDLDIRECT 50.0 04/25/2018   Fenofibrate lovaza Atorvastatin  He was eating too much beef   Patient Active Problem List   Diagnosis Date Noted  . Exposure to COVID-19 virus 06/05/2019  . Colon cancer screening 03/06/2019  . Prostate cancer screening 10/23/2018  . Elevated serum creatinine 05/02/2018  . Family history of coronary artery disease 02/17/2018  . Obesity (BMI 30-39.9) 10/25/2017  . Sickle cell trait ([place]) 07/26/2017  . Routine general medical examination at a health care facility 11/08/2016  . Low testosterone in male 06/02/2016  . Low libido 05/22/2016  . CAD S/P percutaneous coronary angioplasty   . Dyslipidemia, goal LDL below 70   . Type 2 diabetes mellitus with retinopathy without macular edema, without long-term current use of insulin ([place])   . Status post coronary artery stent placement   . Acute MI  inferior lateral first episode care ([place]) 01/18/2016  . Screen for STD (sexually transmitted disease) 01/12/2011  . ALLERGIC RHINITIS 11/11/2007  . Hypertriglyceridemia 03/04/2007   Past Medical History:  Diagnosis Date  . Allergy   . CAD (coronary artery disease)    a. 01/2016: Peak troponin 23.15. Left heart cath on 01/21/16 showed 100% occluded mid-dist RCA occlusion with heavy thrombus burden s/p DES. Rx asp thrombectomy and post procedure Aggrastat. Re look 01/21/16 revealed persistent thrombus in prox aspect of stent as well as a distal lesion both of which were intervened on successfully with placement of 2 additional DES.  . Diabetes mellitus ([place])   . Hyperglycemia   . Hyperlipidemia    triglyceriedes   . Hypertriglyceridemia   . Obesity   . Sickle cell trait  [facility])    Past Surgical History:  Procedure Laterality Date  . ANKLE SURGERY    . CARDIAC CATHETERIZATION N/A 01/18/2016   Procedure: Left Heart Cath and Coronary Angiography;  Surgeon: Jettie Booze, MD;  Location: [place] [facility];  Service: Cardiovascular;  Laterality: N/A;  . CARDIAC CATHETERIZATION N/A 01/18/2016   Procedure: Coronary Stent Intervention;  Surgeon: Jettie Booze, MD;  Location: [place] [facility];  Service: Cardiovascular;  Laterality: N/A;  MID RCA  . CARDIAC CATHETERIZATION N/A 01/21/2016   Procedure: Left Heart Cath and Coronary Angiography;  Surgeon: Troy Sine, MD;  Location: [place] [facility];  Service: Cardiovascular;  Laterality: N/A;  . CARDIAC CATHETERIZATION N/A 01/21/2016   Procedure: Coronary Stent Intervention;  Surgeon: Troy Sine, MD;  Location: [place] [facility];  Service: Cardiovascular;  Laterality: N/A;   Social History   Tobacco Use  . Smoking status: Former Smoker    Types: Cigars    Quit date: 01/18/2012    Years since quitting: 8.1  . Smokeless tobacco: Never Used  Substance Use Topics  . Alcohol use: Yes    Alcohol/week: 0.0 standard drinks    Comment: rare  . Drug use: No   Family History  Problem Relation Age of Onset  . Coronary artery disease Mother   . Heart attack Mother   . Hyperlipidemia Mother   . Alcohol abuse Father   . Hyperlipidemia Father   . Hypertension Father   . Hypertension Brother   . Cancer Maternal Grandmother        brain   No Known Allergies Current Outpatient Medications on File Prior to Visit  Medication Sig Dispense Refill  . aspirin EC 81 MG tablet Take 81 mg by mouth daily.    [name] atorvastatin (LIPITOR) 80 MG tablet TAKE 1 TABLET BY MOUTH DAILY AT 6 PM 30 tablet 8  . blood glucose meter kit and supplies Dispense based on patient and insurance preference. Use up to four times daily as directed. (FOR ICD-10 E10.9, E11.9). 1 each 0  . fenofibrate 160 MG tablet Take 1 tablet (160 mg total)  by mouth daily. 90 tablet 3  . metoprolol tartrate (LOPRESSOR) 25 MG tablet TAKE 1 TABLET BY MOUTH TWICE A DAY 60 tablet 7  . nitroGLYCERIN (NITROSTAT) 0.4 MG SL tablet Place 1 tablet (0.4 mg total) under the tongue every 5 (five) minutes as needed for chest pain (3 DOSES MAX). 25 tablet 2  . omega-3 acid ethyl esters (LOVAZA) 1 g capsule TAKE 1 CAPSULE BY MOUTH 2 TIMES DAILY 60 capsule 0   No current facility-administered medications on file prior to visit.    Review of Systems  Constitutional: Positive for fatigue. Negative for activity change, appetite change, fever and unexpected weight change.       Drives a truck and is dependent on caffeine  HENT: Negative for congestion, rhinorrhea, sore throat and trouble swallowing.   Eyes: Negative for pain, redness, itching and visual disturbance.  Respiratory: Negative for cough, chest tightness, shortness of breath and wheezing.   Cardiovascular: Negative for chest pain and palpitations.  Gastrointestinal: Negative for abdominal pain, blood in stool, constipation, diarrhea and nausea.  Endocrine: Negative for cold intolerance, heat intolerance, polydipsia and polyuria.  Genitourinary: Negative for difficulty urinating, dysuria, frequency and urgency.  Musculoskeletal: Negative for arthralgias, joint swelling and myalgias.  Skin: Negative for pallor and rash.  Neurological: Negative for dizziness, tremors, weakness, numbness and headaches.  Hematological: Negative for adenopathy. Does not bruise/bleed easily.  Psychiatric/Behavioral: Negative for decreased concentration and dysphoric mood. The patient is not nervous/anxious.        Objective:   Physical Exam Constitutional:      General: He is not in acute distress.    Appearance: Normal appearance. He is well-developed. He is obese. He is not ill-appearing or diaphoretic.  HENT:     Head: Normocephalic and atraumatic.  Eyes:     General: No scleral icterus.    Conjunctiva/sclera:  Conjunctivae normal.     Pupils: Pupils are equal, round, and reactive to light.  Neck:     Thyroid: No thyromegaly.     Vascular: No carotid bruit or JVD.  Cardiovascular:     Rate and Rhythm: Normal rate and regular rhythm.     Pulses: Normal pulses.     Heart sounds: Normal heart sounds. No gallop.   Pulmonary:     Effort: Pulmonary effort is normal. No respiratory distress.     Breath sounds: Normal breath sounds. No wheezing or rales.  Abdominal:     General: Bowel sounds are normal. There is no distension or abdominal bruit.     Palpations: Abdomen is soft. There is no mass.     Tenderness: There is no abdominal tenderness.  Musculoskeletal:     Cervical back: Normal range of motion and neck supple.     Right lower leg: No edema.     Left lower leg: No edema.  Lymphadenopathy:     Cervical: No cervical adenopathy.  Skin:    General: Skin is warm and dry.     Coloration: Skin is not pale.     Findings: No erythema or rash.  Neurological:     Mental Status: He is alert.     Sensory: No sensory deficit.     Coordination: Coordination normal.     Deep Tendon Reflexes: Reflexes are normal and symmetric. Reflexes normal.  Psychiatric:        Mood and Affect: Mood normal.           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)  Recommended sugar busters book  Cr is up - change from metformin to jardiance and watch closely  F/u 1 mo with glucose  logs and call if any side eff Good exercise  On a statin  microalb today  Nl foot exam  Will disc eye care at f/u  Wt loss enc      Relevant Medications    empagliflozin (JARDIANCE) 10 MG TABS tablet   Other Relevant Orders   Microalbumin / creatinine urine ratio (Completed)     Other   Dyslipidemia, goal LDL below 70    Taking atorvastatin and also fenofibrate with lovaza for triglycerides and low HDL CAD- followed by cardiology  Enc to cut back on beef and other fatty meats as well as fried food Disc goals for lipids and reasons to control them Rev last labs with pt Rev low sat fat diet in detail  Very low LDL of 20  Trig down to 237 and HDL still low at 22.9      Obesity (BMI 30-39.9)    Discussed how this problem influences overall health and the risks it imposes  Reviewed plan for weight loss with lower calorie diet (via better food choices and also portion control or program like weight watchers) and exercise building up to or more than 30 minutes 5 days per week including some aerobic activity         Relevant Medications   empagliflozin (JARDIANCE) 10 MG TABS tablet   Elevated serum creatinine    Lab Results  Component Value Date   CREATININE 1.51 (H) 03/15/2020   On metformin for DM2 and a1c is up  Will change to jardiance watching closely (low dose in light of renal fxn)  Enc water intake Re check at f/u in a mo Consider renal ref if needed        Other Visit Diagnoses    Need for influenza vaccination       Relevant Orders   Flu Vaccine QUAD 6+ mos PF IM (Fluarix Quad PF) (Completed)

## 2020-03-20 NOTE — Assessment & Plan Note (Signed)
Lab Results  Component Value Date   CREATININE 1.51 (H) 03/15/2020   On metformin for DM2 and a1c is up  Will change to jardiance watching closely (low dose in light of renal fxn)  Enc water intake Re check at f/u in a mo Consider renal ref if needed

## 2020-03-20 NOTE — Patient Instructions (Addendum)
For diabetes Try to get most of your carbohydrates from produce (with the exception of white potatoes)  Eat less bread/pasta/rice/snack foods/cereals/sweets and other items from the middle of the grocery store (processed carbs) Juices are not good   Higher fiber choices are better (brown bread instead of white)   When you need some caffeine - stay sugar free   There is an old book called "sugar busters" --look for it on line or book store   Keep drinking a lot of water    For cholesterol Avoid red meat/ fried foods/ egg yolks/ fatty breakfast meats/ butter, cheese and high fat dairy/ and shellfish    Stop metformin and try jardiance 10 mg daily  Check blood sugar daily at different times   Follow up in about a month with your glucose logs

## 2020-03-28 ENCOUNTER — Other Ambulatory Visit: Payer: Self-pay | Admitting: Family Medicine

## 2020-04-01 ENCOUNTER — Other Ambulatory Visit: Payer: Self-pay | Admitting: Family Medicine

## 2020-04-02 NOTE — Telephone Encounter (Signed)
Last OV 03/20/20 Last fill 03/06/19  #90/3

## 2020-04-22 ENCOUNTER — Other Ambulatory Visit: Payer: Self-pay

## 2020-04-22 ENCOUNTER — Ambulatory Visit: Payer: 59 | Admitting: Family Medicine

## 2020-04-22 ENCOUNTER — Encounter: Payer: Self-pay | Admitting: Family Medicine

## 2020-04-22 VITALS — BP 118/76 | HR 67 | Temp 97.9°F | Ht 72.0 in | Wt 236.2 lb

## 2020-04-22 DIAGNOSIS — E785 Hyperlipidemia, unspecified: Secondary | ICD-10-CM

## 2020-04-22 DIAGNOSIS — E669 Obesity, unspecified: Secondary | ICD-10-CM

## 2020-04-22 DIAGNOSIS — E11319 Type 2 diabetes mellitus with unspecified diabetic retinopathy without macular edema: Secondary | ICD-10-CM | POA: Diagnosis not present

## 2020-04-22 DIAGNOSIS — R7989 Other specified abnormal findings of blood chemistry: Secondary | ICD-10-CM

## 2020-04-22 LAB — POC URINALSYSI DIPSTICK (AUTOMATED)
Bilirubin, UA: NEGATIVE
Blood, UA: NEGATIVE
Glucose, UA: POSITIVE — AB
Ketones, UA: NEGATIVE
Leukocytes, UA: NEGATIVE
Nitrite, UA: NEGATIVE
Protein, UA: NEGATIVE
Spec Grav, UA: 1.02 (ref 1.010–1.025)
Urobilinogen, UA: 0.2 E.U./dL
pH, UA: 5.5 (ref 5.0–8.0)

## 2020-04-22 LAB — BASIC METABOLIC PANEL
BUN: 24 mg/dL — ABNORMAL HIGH (ref 6–23)
CO2: 25 mEq/L (ref 19–32)
Calcium: 9.3 mg/dL (ref 8.4–10.5)
Chloride: 108 mEq/L (ref 96–112)
Creatinine, Ser: 1.58 mg/dL — ABNORMAL HIGH (ref 0.40–1.50)
GFR: 50.43 mL/min — ABNORMAL LOW (ref >60.00)
Glucose, Bld: 105 mg/dL — ABNORMAL HIGH (ref 70–99)
Potassium: 4.3 mEq/L (ref 3.5–5.1)
Sodium: 139 mEq/L (ref 135–145)

## 2020-04-22 NOTE — Progress Notes (Signed)
Subjective:    Patient ID: Andrew Burgess, male    DOB: May 07, 1971, 49 y.o.   MRN: [id number]  This visit occurred during the SARS-CoV-2 public health emergency.  Safety protocols were in place, including screening questions prior to the visit, additional usage of staff PPE, and extensive cleaning of exam room while observing appropriate contact time as indicated for disinfecting solutions.    HPI Pt presents for f/u of DM2  Wt Readings from Last 3 Encounters:  04/22/20 236 lb 3.2 oz (107.1 kg)  03/20/20 240 lb 1 oz (108.9 kg)  03/14/20 235 lb 9.6 oz (106.9 kg)  weight is starting to come down  32.03 kg/m  DM2 Lab Results  Component Value Date   HGBA1C 7.5 (H) 03/15/2020    Taking metformin - Cr was high  Lab Results  Component Value Date   CREATININE 1.51 (H) 03/15/2020   BUN 19 03/15/2020   NA 139 03/15/2020   K 4.3 03/15/2020   CL 105 03/15/2020   CO2 26 03/15/2020    Changed to jardiance  (feels better)  Glucose readings are starting to improve Exercise has helped - likes to go walking   (daily when he can - 3 days per week)  Stress is a factor  Things with sauces make glucose go high  Sticking with 1 pc of bread for sandwich  Avoiding fried foods  No sweets  No sugar drinks (occ grapefruit juice)  Learning what to do  Glucose was as low as 80 (once)  Up to 200 (once)  avg in the middle     Lab Results  Component Value Date   MICROALBUR 3.2 (H) 03/20/2020   MICROALBUR 1.4 05/02/2018   Eye care- over a year   ua today Results for orders placed or performed in visit on 04/22/20  POCT Urinalysis Dipstick (Automated)  Result Value Ref Range   Color, UA light yellow    Clarity, UA clear    Glucose, UA Positive (A) Negative   Bilirubin, UA neg    Ketones, UA neg    Spec Grav, UA 1.020 1.010 - 1.025   Blood, UA neg    pH, UA 5.5 5.0 - 8.0   Protein, UA Negative Negative   Urobilinogen, UA 0.2 0.2 or 1.0 E.U./dL   Nitrite, UA neg    Leukocytes,  UA Negative Negative      BP Readings from Last 3 Encounters:  04/22/20 118/76  03/20/20 128/78  03/14/20 116/74   Pulse Readings from Last 3 Encounters:  04/22/20 67  03/20/20 66  03/14/20 75    Hyperlipidemia  taking atorvastatin and fenofibrate with lovaza Lab Results  Component Value Date   CHOL 105 03/15/2020   HDL 22.90 (L) 03/15/2020   LDLCALC 20 07/26/2017   LDLDIRECT 43.0 03/15/2020   TRIG 237.0 (H) 03/15/2020   CHOLHDL 5 03/15/2020   Patient Active Problem List   Diagnosis Date Noted  . Exposure to COVID-19 virus 06/05/2019  . Colon cancer screening 03/06/2019  . Prostate cancer screening 10/23/2018  . Elevated serum creatinine 05/02/2018  . Family history of coronary artery disease 02/17/2018  . Obesity (BMI 30-39.9) 10/25/2017  . Sickle cell trait ([place]) 07/26/2017  . Routine general medical examination at a health care facility 11/08/2016  . Low testosterone in male 06/02/2016  . Low libido 05/22/2016  . CAD S/P percutaneous coronary angioplasty   . Dyslipidemia, goal LDL below 70   . Type 2 diabetes mellitus with retinopathy without  macular edema, without long-term current use of insulin ([place])   . Status post coronary artery stent placement   . Acute MI inferior lateral first episode care ([place]) 01/18/2016  . Screen for STD (sexually transmitted disease) 01/12/2011  . ALLERGIC RHINITIS 11/11/2007  . Hypertriglyceridemia 03/04/2007   Past Medical History:  Diagnosis Date  . Allergy   . CAD (coronary artery disease)    a. 01/2016: Peak troponin 23.15. Left heart cath on 01/21/16 showed 100% occluded mid-dist RCA occlusion with heavy thrombus burden s/p DES. Rx asp thrombectomy and post procedure Aggrastat. Re look 01/21/16 revealed persistent thrombus in prox aspect of stent as well as a distal lesion both of which were intervened on successfully with placement of 2 additional DES.  . Diabetes mellitus ([place])   . Hyperglycemia   . Hyperlipidemia     triglyceriedes   . Hypertriglyceridemia   . Obesity   . Sickle cell trait [facility])    Past Surgical History:  Procedure Laterality Date  . ANKLE SURGERY    . CARDIAC CATHETERIZATION N/A 01/18/2016   Procedure: Left Heart Cath and Coronary Angiography;  Surgeon: Jettie Booze, MD;  Location: [place] [facility];  Service: Cardiovascular;  Laterality: N/A;  . CARDIAC CATHETERIZATION N/A 01/18/2016   Procedure: Coronary Stent Intervention;  Surgeon: Jettie Booze, MD;  Location: [place] [facility];  Service: Cardiovascular;  Laterality: N/A;  MID RCA  . CARDIAC CATHETERIZATION N/A 01/21/2016   Procedure: Left Heart Cath and Coronary Angiography;  Surgeon: Troy Sine, MD;  Location: [place] [facility];  Service: Cardiovascular;  Laterality: N/A;  . CARDIAC CATHETERIZATION N/A 01/21/2016   Procedure: Coronary Stent Intervention;  Surgeon: Troy Sine, MD;  Location: [place] [facility];  Service: Cardiovascular;  Laterality: N/A;   Social History   Tobacco Use  . Smoking status: Former Smoker    Types: Cigars    Quit date: 01/18/2012    Years since quitting: 8.2  . Smokeless tobacco: Never Used  Substance Use Topics  . Alcohol use: Yes    Alcohol/week: 0.0 standard drinks    Comment: rare  . Drug use: No   Family History  Problem Relation Age of Onset  . Coronary artery disease Mother   . Heart attack Mother   . Hyperlipidemia Mother   . Alcohol abuse Father   . Hyperlipidemia Father   . Hypertension Father   . Hypertension Brother   . Cancer Maternal Grandmother        brain   No Known Allergies Current Outpatient Medications on File Prior to Visit  Medication Sig Dispense Refill  . aspirin EC 81 MG tablet Take 81 mg by mouth daily.    [name] atorvastatin (LIPITOR) 80 MG tablet TAKE 1 TABLET BY MOUTH DAILY AT 6 PM 30 tablet 8  . blood glucose meter kit and supplies Dispense based on patient and insurance preference. Use up to four times daily as directed. (FOR ICD-10 E10.9,  E11.9). 1 each 0  . empagliflozin (JARDIANCE) 10 MG TABS tablet Take 1 tablet (10 mg total) by mouth daily before breakfast. 30 tablet 3  . fenofibrate 160 MG tablet TAKE 1 TABLET BY MOUTH EVERY DAY 90 tablet 3  . metoprolol tartrate (LOPRESSOR) 25 MG tablet TAKE 1 TABLET BY MOUTH TWICE A DAY 60 tablet 7  . nitroGLYCERIN (NITROSTAT) 0.4 MG SL tablet Place 1 tablet (0.4 mg total) under the tongue every 5 (five) minutes as needed for chest pain (3 DOSES  MAX). 25 tablet 2  . omega-3 acid ethyl esters (LOVAZA) 1 g capsule TAKE 1 CAPSULE BY MOUTH 2 TIMES DAILY 60 capsule 5   No current facility-administered medications on file prior to visit.    Review of Systems  Constitutional: Negative for activity change, appetite change, fatigue, fever and unexpected weight change.  HENT: Negative for congestion, rhinorrhea, sore throat and trouble swallowing.   Eyes: Negative for pain, redness, itching and visual disturbance.  Respiratory: Negative for cough, chest tightness, shortness of breath and wheezing.   Cardiovascular: Negative for chest pain and palpitations.  Gastrointestinal: Negative for abdominal pain, blood in stool, constipation, diarrhea and nausea.  Endocrine: Negative for cold intolerance, heat intolerance, polydipsia and polyuria.  Genitourinary: Negative for difficulty urinating, dysuria, frequency and urgency.  Musculoskeletal: Negative for arthralgias, joint swelling and myalgias.  Skin: Negative for pallor and rash.  Neurological: Negative for dizziness, tremors, weakness, numbness and headaches.  Hematological: Negative for adenopathy. Does not bruise/bleed easily.  Psychiatric/Behavioral: Negative for decreased concentration and dysphoric mood. The patient is not nervous/anxious.        Objective:   Physical Exam Constitutional:      General: He is not in acute distress.    Appearance: Normal appearance. He is well-developed. He is obese. He is not ill-appearing or diaphoretic.   HENT:     Head: Normocephalic and atraumatic.     Mouth/Throat:     Mouth: Mucous membranes are moist.  Eyes:     General: No scleral icterus.    Conjunctiva/sclera: Conjunctivae normal.     Pupils: Pupils are equal, round, and reactive to light.  Neck:     Thyroid: No thyromegaly.     Vascular: No carotid bruit or JVD.  Cardiovascular:     Rate and Rhythm: Normal rate and regular rhythm.     Heart sounds: Normal heart sounds. No gallop.   Pulmonary:     Effort: Pulmonary effort is normal. No respiratory distress.     Breath sounds: Normal breath sounds. No wheezing or rales.  Abdominal:     General: Bowel sounds are normal. There is no distension or abdominal bruit.     Palpations: Abdomen is soft. There is no mass.     Tenderness: There is no abdominal tenderness.  Musculoskeletal:     Cervical back: Normal range of motion and neck supple.     Right lower leg: No edema.     Left lower leg: No edema.  Lymphadenopathy:     Cervical: No cervical adenopathy.  Skin:    General: Skin is warm and dry.     Findings: No rash.  Neurological:     Mental Status: He is alert.     Deep Tendon Reflexes: Reflexes are normal and symmetric.  Psychiatric:        Mood and Affect: Mood normal.           Assessment & Plan:   Problem List Items Addressed This Visit      Endocrine   Type 2 diabetes mellitus with retinopathy without macular edema, without long-term current use of insulin ([place]) - Primary    Lab Results  Component Value Date   HGBA1C 7.5 (H) 03/15/2020   In the past mo-pt feels like glucose levels are significantly improved with better diet and exercise and change from metformin to Januvia (no side eff)  Re check renal panel today-hope for imp  He plans to schedule his DM eye exam at [facility] eye care  UA did show glucose today  (microalb noted last time)  On statin  Will f/u in 3 mo  Enc him to keep loosing wt as well (is doing better)       Relevant Orders    Basic metabolic panel     Other   Dyslipidemia, goal LDL below 70    Continues atorvastatin and fenofibrate and lovazain setting of CAD Last LDL 43 Disc goals for lipids and reasons to control them Rev last labs with pt Rev low sat fat diet in detail       Obesity (BMI 30-39.9)    Discussed how this problem influences overall health and the risks it imposes  Reviewed plan for weight loss with lower calorie diet (via better food choices and also portion control or program like weight watchers) and exercise building up to or more than 30 minutes 5 days per week including some aerobic activity   Commended on 4 lb lost so far Enc to keep walking       Elevated serum creatinine    Now off metformin (change to jardiance) and hoping for improvement  ua noted glucose  microalb up last time bp may be too low for ace currently  Pending bmp today       Relevant Orders   Basic metabolic panel   POCT Urinalysis Dipstick (Automated) (Completed)

## 2020-04-22 NOTE — Assessment & Plan Note (Signed)
Continues atorvastatin and fenofibrate and lovazain setting of CAD Last LDL 43 Disc goals for lipids and reasons to control them Rev last labs with pt Rev low sat fat diet in detail

## 2020-04-22 NOTE — Assessment & Plan Note (Signed)
Discussed how this problem influences overall health and the risks it imposes  Reviewed plan for weight loss with lower calorie diet (via better food choices and also portion control or program like weight watchers) and exercise building up to or more than 30 minutes 5 days per week including some aerobic activity   Commended on 4 lb lost so far Enc to keep walking

## 2020-04-22 NOTE — Patient Instructions (Addendum)
You need a diabetic eye exam  Please make an appointment -tell them you need a diabetic eye exam and make sure they send [place] a copy   I hope your kidney labs look better off metformin  Keep drinking water  Keep watching diet for diabetes   Try to get most of your carbohydrates from produce (with the exception of white potatoes)  Eat less bread/pasta/rice/snack foods/cereals/sweets and other items from the middle of the grocery store (processed carbs)  Keep walking whenever you can   Follow up in 3 months

## 2020-04-22 NOTE — Assessment & Plan Note (Signed)
Lab Results  Component Value Date   HGBA1C 7.5 (H) 03/15/2020   In the past mo-pt feels like glucose levels are significantly improved with better diet and exercise and change from metformin to Januvia (no side eff)  Re check renal panel today-hope for imp  He plans to schedule his DM eye exam at [facility] eye care  UA did show glucose today  (microalb noted last time)  On statin  Will f/u in 3 mo  Enc him to keep loosing wt as well (is doing better)

## 2020-04-22 NOTE — Assessment & Plan Note (Signed)
Now off metformin (change to jardiance) and hoping for improvement  ua noted glucose  microalb up last time bp may be too low for ace currently  Pending bmp today

## 2020-04-23 ENCOUNTER — Telehealth: Payer: Self-pay | Admitting: Family Medicine

## 2020-04-23 DIAGNOSIS — R7989 Other specified abnormal findings of blood chemistry: Secondary | ICD-10-CM

## 2020-04-23 DIAGNOSIS — E11319 Type 2 diabetes mellitus with unspecified diabetic retinopathy without macular edema: Secondary | ICD-10-CM

## 2020-04-23 NOTE — Telephone Encounter (Signed)
-----   Message from Tammi Sou, [place] sent at 04/23/2020  4:47 PM EST ----- Pt notified of lab results and Dr. Marliss Coots comments. Pt is worried about labs and would like to go ahead and see a renal doc as soon as possible. Pt would like to see someone in [place] if possible. Please put referral in and I advise pt our [facility] will call to schedule appt

## 2020-04-23 NOTE — Telephone Encounter (Signed)
Nephrology ref  Pt pref [place]

## 2020-05-15 ENCOUNTER — Other Ambulatory Visit: Payer: Self-pay | Admitting: Cardiology

## 2020-05-18 ENCOUNTER — Other Ambulatory Visit: Payer: Self-pay | Admitting: Cardiology

## 2020-05-20 ENCOUNTER — Other Ambulatory Visit: Payer: Self-pay | Admitting: Nephrology

## 2020-05-20 DIAGNOSIS — N1831 Chronic kidney disease, stage 3a: Secondary | ICD-10-CM

## 2020-05-23 ENCOUNTER — Telehealth: Payer: Self-pay | Admitting: Family Medicine

## 2020-05-23 DIAGNOSIS — E11319 Type 2 diabetes mellitus with unspecified diabetic retinopathy without macular edema: Secondary | ICD-10-CM

## 2020-05-23 MED ORDER — GLUCOSE BLOOD VI STRP: ORAL_STRIP | 3 refills | Status: DC

## 2020-05-23 NOTE — Telephone Encounter (Signed)
Spoke with pt to get clarification on which OneTouch test strips he needs.  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  E-scribed test strips.

## 2020-05-23 NOTE — Telephone Encounter (Signed)
Patient called into office and is requesting more onetouch test strips for monitor. Please advise.

## 2020-05-30 ENCOUNTER — Ambulatory Visit
Admission: RE | Admit: 2020-05-30 | Discharge: 2020-05-30 | Disposition: A | Discharge status: Home / Self Care | Payer: 59 | Source: Ambulatory Visit | Attending: Nephrology | Admitting: Nephrology

## 2020-05-30 DIAGNOSIS — N1831 Chronic kidney disease, stage 3a: Secondary | ICD-10-CM

## 2020-06-11 ENCOUNTER — Telehealth: Payer: Self-pay

## 2020-06-11 NOTE — Telephone Encounter (Signed)
Left VM letting pt know Dr. Marliss Coots recommendations

## 2020-06-11 NOTE — Telephone Encounter (Signed)
I spoke with pt; pt had a cough earlier and took Dayquil and that made sugar rise to 240. Pt said now doing OK and BS is 114. Pt no longer has cough but wants to know what would be a cough med pt can take without sugar. I advised Diabetic Tussin cough med does not have sugar and pt asked if that was for cold symptoms as well. Pt said he is not sick now but if he were to get cough and cold symptoms again he would like to have med on hand for cold symptoms and cough that will not make his blood sugar go up. Pt request call to Dr Milinda Antis and then request cb.

## 2020-06-11 NOTE — Telephone Encounter (Signed)
Zyrtec for runny nose  Nasal saline for congestion  Tylenol for pain/fever   Does need a covid test if symptoms continue

## 2020-06-12 NOTE — Telephone Encounter (Signed)
[facility] Night - Client Nonclinical Telephone Record AccessNurse Client [facility] Night - Client Client Site [facility] - Night Physician [place], Idamae Schuller - MD Contact Type Call Who Is Calling Patient / Member / Family / Caregiver Caller Name Dario Yono Phone Number [phone] Call Type Message Only Information Provided Reason for Call Returning a Call from the Office Initial Comment Caller returning phone call. Additional Comment Provided office hours. Disp. Time Disposition Final User 06/11/2020 6:01:21 PM General Information Provided Yes Junius Creamer Call Closed By: Junius Creamer Transaction Date/Time: 06/11/2020 5:59:44 PM (ET)

## 2020-07-01 ENCOUNTER — Other Ambulatory Visit: Payer: Self-pay | Admitting: Family Medicine

## 2020-07-15 ENCOUNTER — Telehealth: Payer: Self-pay | Admitting: Family Medicine

## 2020-07-15 DIAGNOSIS — E11319 Type 2 diabetes mellitus with unspecified diabetic retinopathy without macular edema: Secondary | ICD-10-CM

## 2020-07-15 MED ORDER — GLUCOSE BLOOD VI STRP: ORAL_STRIP | 3 refills | Status: DC

## 2020-07-15 NOTE — Telephone Encounter (Signed)
I sent with new instructions

## 2020-07-15 NOTE — Telephone Encounter (Signed)
Pt called in wanted to know about getting more test strips due to he thought it was suppose to be taken 2 times a day instead of 1 and the insurance will not cover it

## 2020-08-05 ENCOUNTER — Ambulatory Visit: Payer: 59 | Admitting: Family Medicine

## 2020-08-12 ENCOUNTER — Other Ambulatory Visit: Payer: Self-pay

## 2020-08-12 ENCOUNTER — Ambulatory Visit: Payer: 59 | Admitting: Family Medicine

## 2020-08-12 ENCOUNTER — Encounter: Payer: Self-pay | Admitting: Family Medicine

## 2020-08-12 VITALS — BP 118/74 | HR 74 | Temp 98.6°F | Ht 72.0 in | Wt 231.0 lb

## 2020-08-12 DIAGNOSIS — D573 Sickle-cell trait: Secondary | ICD-10-CM

## 2020-08-12 DIAGNOSIS — R7989 Other specified abnormal findings of blood chemistry: Secondary | ICD-10-CM | POA: Diagnosis not present

## 2020-08-12 DIAGNOSIS — E11319 Type 2 diabetes mellitus with unspecified diabetic retinopathy without macular edema: Secondary | ICD-10-CM | POA: Diagnosis not present

## 2020-08-12 DIAGNOSIS — E785 Hyperlipidemia, unspecified: Secondary | ICD-10-CM

## 2020-08-12 DIAGNOSIS — E1169 Type 2 diabetes mellitus with other specified complication: Secondary | ICD-10-CM | POA: Diagnosis not present

## 2020-08-12 DIAGNOSIS — E669 Obesity, unspecified: Secondary | ICD-10-CM | POA: Diagnosis not present

## 2020-08-12 LAB — BASIC METABOLIC PANEL
BUN: 18 mg/dL (ref 6–23)
CO2: 26 mEq/L (ref 19–32)
Calcium: 9.6 mg/dL (ref 8.4–10.5)
Chloride: 106 mEq/L (ref 96–112)
Creatinine, Ser: 1.5 mg/dL (ref 0.40–1.50)
GFR: 53.56 mL/min — ABNORMAL LOW (ref >60.00)
Glucose, Bld: 110 mg/dL — ABNORMAL HIGH (ref 70–99)
Potassium: 4.1 mEq/L (ref 3.5–5.1)
Sodium: 139 mEq/L (ref 135–145)

## 2020-08-12 LAB — POCT GLYCOSYLATED HEMOGLOBIN (HGB A1C): Hemoglobin A1C: 5.2 % (ref 4.0–5.6)

## 2020-08-12 NOTE — Assessment & Plan Note (Signed)
Lab Results  Component Value Date   HGBA1C 5.2 08/12/2020   Much improved  Plan to continue jardiance 10 mg daily Nl foot exam  Renal panel today  Eye exam planned for march  Doing great with lifestyle habits  F/u 6 mo

## 2020-08-12 NOTE — Patient Instructions (Signed)
A1C is better at 5.2  Keep up the good work!  Keep working on diet and exercise  Keep drinking lots of water   Labs for kidney function today

## 2020-08-12 NOTE — Progress Notes (Signed)
Subjective:    Patient ID: Andrew Burgess, male    DOB: 10/10/1970, 50 y.o.   MRN: [id number]  This visit occurred during the SARS-CoV-2 public health emergency.  Safety protocols were in place, including screening questions prior to the visit, additional usage of staff PPE, and extensive cleaning of exam room while observing appropriate contact time as indicated for disinfecting solutions.    HPI Pt presents for f/u of chronic health problems  Wt Readings from Last 3 Encounters:  08/12/20 231 lb (104.8 kg)  04/22/20 236 lb 3.2 oz (107.1 kg)  03/20/20 240 lb 1 oz (108.9 kg)  down 5 more lb  31.33 kg/m   Lab Results  Component Value Date   HGBA1C 7.5 (H) 03/15/2020   Changed metformin to Jardiance due to renal function (no side effects)  Eye exam 4/21  Working on diet - and now eating more salads  Changing dressings   Am -usually -avg 120s Later 140s at most  Walks for exercise - fairly regularly , also rides bike   Lab Results  Component Value Date   MICROALBUR 3.2 (H) 03/20/2020   MICROALBUR 1.4 05/02/2018   Now: Results for orders placed or performed in visit on 08/12/20  POCT glycosylated hemoglobin (Hb A1C)  Result Value Ref Range   Hemoglobin A1C 5.2 4.0 - 5.6 %   HbA1c POC (<> result, manual entry)     HbA1c, POC (prediabetic range)     HbA1c, POC (controlled diabetic range)         Lab Results  Component Value Date   CREATININE 1.58 (H) 04/22/2020   BUN 24 (H) 04/22/2020   NA 139 04/22/2020   K 4.3 04/22/2020   CL 108 04/22/2020   CO2 25 04/22/2020  drinks a gallon of water per day    BP Readings from Last 3 Encounters:  08/12/20 118/74  04/22/20 118/76  03/20/20 128/78   Pulse Readings from Last 3 Encounters:  08/12/20 74  04/22/20 67  03/20/20 66    Hyperlipidemia Lab Results  Component Value Date   CHOL 105 03/15/2020   HDL 22.90 (L) 03/15/2020   LDLCALC 20 07/26/2017   LDLDIRECT 43.0 03/15/2020   TRIG 237.0 (H) 03/15/2020    CHOLHDL 5 03/15/2020   Taking atorvastatin 80 mg daily and lovaza 1 g daily   Patient Active Problem List   Diagnosis Date Noted  . Exposure to COVID-19 virus 06/05/2019  . Colon cancer screening 03/06/2019  . Prostate cancer screening 10/23/2018  . Elevated serum creatinine 05/02/2018  . Family history of coronary artery disease 02/17/2018  . Obesity (BMI 30-39.9) 10/25/2017  . Sickle cell trait ([place]) 07/26/2017  . Routine general medical examination at a health care facility 11/08/2016  . Low testosterone in male 06/02/2016  . Low libido 05/22/2016  . CAD S/P percutaneous coronary angioplasty   . Hyperlipidemia associated with type 2 diabetes mellitus ([place])   . Type 2 diabetes mellitus with retinopathy without macular edema, without long-term current use of insulin ([place])   . Status post coronary artery stent placement   . Acute MI inferior lateral first episode care ([place]) 01/18/2016  . Screen for STD (sexually transmitted disease) 01/12/2011  . ALLERGIC RHINITIS 11/11/2007  . Hypertriglyceridemia 03/04/2007   Past Medical History:  Diagnosis Date  . Allergy   . CAD (coronary artery disease)    a. 01/2016: Peak troponin 23.15. Left heart cath on 01/21/16 showed 100% occluded mid-dist RCA occlusion with heavy thrombus burden  s/p DES. Rx asp thrombectomy and post procedure Aggrastat. Re look 01/21/16 revealed persistent thrombus in prox aspect of stent as well as a distal lesion both of which were intervened on successfully with placement of 2 additional DES.  . Diabetes mellitus ([place])   . Hyperglycemia   . Hyperlipidemia    triglyceriedes   . Hypertriglyceridemia   . Obesity   . Sickle cell trait [facility])    Past Surgical History:  Procedure Laterality Date  . ANKLE SURGERY    . CARDIAC CATHETERIZATION N/A 01/18/2016   Procedure: Left Heart Cath and Coronary Angiography;  Surgeon: Jettie Booze, MD;  Location: [place] [facility];  Service: Cardiovascular;  Laterality: N/A;  .  CARDIAC CATHETERIZATION N/A 01/18/2016   Procedure: Coronary Stent Intervention;  Surgeon: Jettie Booze, MD;  Location: [place] [facility];  Service: Cardiovascular;  Laterality: N/A;  MID RCA  . CARDIAC CATHETERIZATION N/A 01/21/2016   Procedure: Left Heart Cath and Coronary Angiography;  Surgeon: Troy Sine, MD;  Location: [place] [facility];  Service: Cardiovascular;  Laterality: N/A;  . CARDIAC CATHETERIZATION N/A 01/21/2016   Procedure: Coronary Stent Intervention;  Surgeon: Troy Sine, MD;  Location: [place] [facility];  Service: Cardiovascular;  Laterality: N/A;   Social History   Tobacco Use  . Smoking status: Former Smoker    Types: Cigars    Quit date: 01/18/2012    Years since quitting: 8.5  . Smokeless tobacco: Never Used  Substance Use Topics  . Alcohol use: Yes    Alcohol/week: 0.0 standard drinks    Comment: rare  . Drug use: No   Family History  Problem Relation Age of Onset  . Coronary artery disease Mother   . Heart attack Mother   . Hyperlipidemia Mother   . Alcohol abuse Father   . Hyperlipidemia Father   . Hypertension Father   . Hypertension Brother   . Cancer Maternal Grandmother        brain   No Known Allergies Current Outpatient Medications on File Prior to Visit  Medication Sig Dispense Refill  . aspirin EC 81 MG tablet Take 81 mg by mouth daily.    [name] atorvastatin (LIPITOR) 80 MG tablet TAKE 1 TABLET BY MOUTH DAILY AT 6 PM 90 tablet 2  . blood glucose meter kit and supplies Dispense based on patient and insurance preference. Use up to four times daily as directed. (FOR ICD-10 E10.9, E11.9). 1 each 0  . fenofibrate 160 MG tablet TAKE 1 TABLET BY MOUTH EVERY DAY 90 tablet 3  . glucose blood test strip Use as instructed to check blood sugar twice daily and prn for diabetes 100 each 3  . JARDIANCE 10 MG TABS tablet TAKE 1 TABLET BY MOUTH DAILY BEFORE BREAKFAST. 30 tablet 3  . metoprolol tartrate (LOPRESSOR) 25 MG tablet TAKE 1 TABLET BY MOUTH  TWICE A DAY 60 tablet 7  . nitroGLYCERIN (NITROSTAT) 0.4 MG SL tablet Place 1 tablet (0.4 mg total) under the tongue every 5 (five) minutes as needed for chest pain (3 DOSES MAX). 25 tablet 2  . omega-3 acid ethyl esters (LOVAZA) 1 g capsule TAKE 1 CAPSULE BY MOUTH 2 TIMES DAILY 60 capsule 5   No current facility-administered medications on file prior to visit.     Review of Systems  Constitutional: Negative for activity change, appetite change, fatigue, fever and unexpected weight change.  HENT: Negative for congestion, rhinorrhea, sore throat and trouble swallowing.   Eyes:  Negative for pain, redness, itching and visual disturbance.  Respiratory: Negative for cough, chest tightness, shortness of breath and wheezing.   Cardiovascular: Negative for chest pain and palpitations.  Gastrointestinal: Negative for abdominal pain, blood in stool, constipation, diarrhea and nausea.  Endocrine: Negative for cold intolerance, heat intolerance, polydipsia and polyuria.  Genitourinary: Negative for difficulty urinating, dysuria, frequency and urgency.  Musculoskeletal: Negative for arthralgias, joint swelling and myalgias.  Skin: Negative for pallor and rash.  Neurological: Negative for dizziness, tremors, weakness, numbness and headaches.  Hematological: Negative for adenopathy. Does not bruise/bleed easily.  Psychiatric/Behavioral: Negative for decreased concentration and dysphoric mood. The patient is not nervous/anxious.        Objective:   Physical Exam Constitutional:      General: He is not in acute distress.    Appearance: Normal appearance. He is well-developed and well-nourished. He is obese. He is not ill-appearing or diaphoretic.  HENT:     Head: Normocephalic and atraumatic.     Mouth/Throat:     Mouth: Oropharynx is clear and moist.  Eyes:     Extraocular Movements: EOM normal.     Conjunctiva/sclera: Conjunctivae normal.     Pupils: Pupils are equal, round, and reactive to  light.  Neck:     Thyroid: No thyromegaly.     Vascular: No carotid bruit or JVD.  Cardiovascular:     Rate and Rhythm: Normal rate and regular rhythm.     Pulses: Intact distal pulses.     Heart sounds: Normal heart sounds. No gallop.   Pulmonary:     Effort: Pulmonary effort is normal. No respiratory distress.     Breath sounds: Normal breath sounds. No wheezing or rales.     Comments: No crackles Abdominal:     General: Bowel sounds are normal. There is no distension or abdominal bruit.     Palpations: Abdomen is soft. There is no mass.     Tenderness: There is no abdominal tenderness.  Musculoskeletal:        General: No edema.     Cervical back: Normal range of motion and neck supple.     Right lower leg: No edema.     Left lower leg: No edema.  Lymphadenopathy:     Cervical: No cervical adenopathy.  Skin:    General: Skin is warm and dry.     Findings: No rash.  Neurological:     Mental Status: He is alert.     Sensory: No sensory deficit.     Deep Tendon Reflexes: Reflexes are normal and symmetric.  Psychiatric:        Mood and Affect: Mood and affect and mood normal.           Assessment & Plan:   Problem List Items Addressed This Visit      Endocrine   Hyperlipidemia associated with type 2 diabetes mellitus ([place])    Well contolled with fenofibrate 160 mg daily and lovaza 1 g daily  HDL is low-continue exercise Disc goals for lipids and reasons to control them Rev last labs with pt Rev low sat fat diet in detail       Type 2 diabetes mellitus with retinopathy without macular edema, without long-term current use of insulin (H[facility]) - Primary    Lab Results  Component Value Date   HGBA1C 5.2 08/12/2020   Much improved  Plan to continue jardiance 10 mg daily Nl foot exam  Renal panel today  Eye exam planned for march  Doing great with lifestyle habits  F/u 6 mo      Relevant Orders   POCT glycosylated hemoglobin (Hb A1C) (Completed)   Basic  metabolic panel     Other   Sickle cell trait (HCC)    No clinical changes       Obesity (BMI 30-39.9)    Discussed how this problem influences overall health and the risks it imposes  Reviewed plan for weight loss with lower calorie diet (via better food choices and also portion control or program like weight watchers) and exercise building up to or more than 30 minutes 5 days per week including some aerobic activity   Commended hard work so far       Elevated serum creatinine    Drinking more water  On Jardiance  Lab today      Relevant Orders   Basic metabolic panel

## 2020-08-12 NOTE — Assessment & Plan Note (Signed)
Discussed how this problem influences overall health and the risks it imposes  °Reviewed plan for weight loss with lower calorie diet (via better food choices and also portion control or program like weight watchers) and exercise building up to or more than 30 minutes 5 days per week including some aerobic activity  ° °Commended hard work so far °

## 2020-08-12 NOTE — Assessment & Plan Note (Signed)
Well contolled with fenofibrate 160 mg daily and lovaza 1 g daily  HDL is low-continue exercise Disc goals for lipids and reasons to control them Rev last labs with pt Rev low sat fat diet in detail

## 2020-08-12 NOTE — Assessment & Plan Note (Signed)
No clinical changes 

## 2020-08-12 NOTE — Assessment & Plan Note (Signed)
Drinking more water  On Jardiance  Lab today

## 2020-09-02 LAB — HM DIABETES EYE EXAM

## 2020-09-06 ENCOUNTER — Encounter: Payer: Self-pay | Admitting: Family Medicine

## 2020-09-22 ENCOUNTER — Other Ambulatory Visit: Payer: Self-pay | Admitting: Family Medicine

## 2020-10-04 ENCOUNTER — Other Ambulatory Visit: Payer: Self-pay | Admitting: Family Medicine

## 2020-10-04 DIAGNOSIS — R739 Hyperglycemia, unspecified: Secondary | ICD-10-CM

## 2020-10-04 MED ORDER — NITROGLYCERIN 0.4 MG SL SUBL: 0.4000 mg | SUBLINGUAL_TABLET | SUBLINGUAL | 2 refills | Status: DC | PRN

## 2020-10-04 NOTE — Telephone Encounter (Signed)
Pharmacy requests refill on: Nitroglycerin 0.4 mg   LAST REFILL: 07/12/2017 (Q-25, R-2) LAST OV: 08/12/2020 NEXT OV: 02/10/2021 PHARMACY: [id number] [place], [place]

## 2020-10-04 NOTE — Telephone Encounter (Signed)
  LAST APPOINTMENT DATE: 09/22/2020   NEXT APPOINTMENT DATE:@8 /29/2022  MEDICATION: NITROGLYCERIN  [facility] [address]  Let patient know to contact pharmacy at the end of the day to make sure medication is ready.  Please notify patient to allow 48-72 hours to process  Encourage patient to contact the pharmacy for refills or they can request refills through [place]:   LAST REFILL:  QTY:  REFILL DATE:    OTHER COMMENTS:    Okay for refill?  Please advise

## 2020-10-24 ENCOUNTER — Other Ambulatory Visit: Payer: Self-pay | Admitting: Family Medicine

## 2020-12-16 ENCOUNTER — Other Ambulatory Visit: Payer: Self-pay | Admitting: Family Medicine

## 2020-12-16 DIAGNOSIS — R739 Hyperglycemia, unspecified: Secondary | ICD-10-CM

## 2020-12-19 NOTE — Telephone Encounter (Signed)
Last filled on 10/04/20 #25 tabs with 2 refills, f/u scheduled on 02/10/21

## 2020-12-20 ENCOUNTER — Other Ambulatory Visit: Payer: Self-pay | Admitting: Cardiology

## 2020-12-20 ENCOUNTER — Other Ambulatory Visit: Payer: Self-pay | Admitting: Family Medicine

## 2021-02-10 ENCOUNTER — Other Ambulatory Visit: Payer: Self-pay

## 2021-02-10 ENCOUNTER — Ambulatory Visit: Payer: 59 | Admitting: Family Medicine

## 2021-02-10 ENCOUNTER — Encounter: Payer: Self-pay | Admitting: Family Medicine

## 2021-02-10 VITALS — BP 124/76 | HR 63 | Temp 97.5°F | Ht 72.0 in | Wt 232.2 lb

## 2021-02-10 DIAGNOSIS — E11319 Type 2 diabetes mellitus with unspecified diabetic retinopathy without macular edema: Secondary | ICD-10-CM

## 2021-02-10 DIAGNOSIS — Z23 Encounter for immunization: Secondary | ICD-10-CM | POA: Diagnosis not present

## 2021-02-10 DIAGNOSIS — N1831 Chronic kidney disease, stage 3a: Secondary | ICD-10-CM | POA: Diagnosis not present

## 2021-02-10 DIAGNOSIS — E1169 Type 2 diabetes mellitus with other specified complication: Secondary | ICD-10-CM

## 2021-02-10 DIAGNOSIS — E785 Hyperlipidemia, unspecified: Secondary | ICD-10-CM | POA: Diagnosis not present

## 2021-02-10 DIAGNOSIS — E669 Obesity, unspecified: Secondary | ICD-10-CM

## 2021-02-10 DIAGNOSIS — E1121 Type 2 diabetes mellitus with diabetic nephropathy: Secondary | ICD-10-CM

## 2021-02-10 NOTE — Assessment & Plan Note (Signed)
Good glucose readings at home Still working hard on diet and exercise and wt loss Eye exam up to date Continues jardiance 10 mg daily Disc cutting back bread  Taking a statin  Seeing a nephrologist for kidney care  A1C drawn

## 2021-02-10 NOTE — Assessment & Plan Note (Signed)
Seeing nephrology (had visit today lab pending)  Disc imp of water intake Avoid nsaids

## 2021-02-10 NOTE — Assessment & Plan Note (Signed)
Discussed how this problem influences overall health and the risks it imposes  Reviewed plan for weight loss with lower calorie diet (via better food choices and also portion control or program like weight watchers) and exercise building up to or more than 30 minutes 5 days per week including some aerobic activity    

## 2021-02-10 NOTE — Assessment & Plan Note (Signed)
Disc goals for lipids and reasons to control them Rev last labs with pt Rev low sat fat diet in detail Lab today  Taking  Fenofibrate 160 mg daily  lovaza 1 6 daily  Atorvastatin 80 mg daily  Good diet Lab today

## 2021-02-10 NOTE — Patient Instructions (Signed)
Keep working on [employer] and exercise   Try to get most of your carbohydrates from produce (with the exception of white potatoes)  Eat less bread/pasta/rice/snack foods/cereals/sweets and other items from the middle of the grocery store (processed carbs) Avoid red meat/ fried foods/ egg yolks/ fatty breakfast meats/ butter, cheese and high fat dairy/ and shellfish    Drink lots of water   Labs today

## 2021-02-10 NOTE — Progress Notes (Signed)
Subjective:    Patient ID: Andrew Burgess, male    DOB: 03-10-1971, 50 y.o.   MRN: [id number]  This visit occurred during the SARS-CoV-2 public health emergency.  Safety protocols were in place, including screening questions prior to the visit, additional usage of staff PPE, and extensive cleaning of exam room while observing appropriate contact time as indicated for disinfecting solutions.    HPI Pt prevents for f/u of DM2 and hyperlipidemia and CAD Also for flu shot   Wt Readings from Last 3 Encounters:  02/10/21 232 lb 3 oz (105.3 kg)  08/12/20 231 lb (104.8 kg)  04/22/20 236 lb 3.2 oz (107.1 kg)   31.49 kg/m  Taking good care of himself   BP Readings from Last 3 Encounters:  02/10/21 124/76  08/12/20 118/74  04/22/20 118/76   Pulse Readings from Last 3 Encounters:  02/10/21 63  08/12/20 74  04/22/20 67   CAD has been stable  No chest pain     DM2 Lab Results  Component Value Date   HGBA1C 5.2 08/12/2020   Due for labs  Jardiance 10 mg daily  Consistent - at most upper 140s  As low 87  (lowest)  Feeling good  Exercise -still walking  Eats a lot of sandwiches - uses wheat bread   Eye exam march No foot problems   Flu shot given today    Diet is pretty good  Occ too much salad dressing     Hyperlipidemia Lab Results  Component Value Date   CHOL 105 03/15/2020   HDL 22.90 (L) 03/15/2020   LDLCALC 20 07/26/2017   LDLDIRECT 43.0 03/15/2020   TRIG 237.0 (H) 03/15/2020   CHOLHDL 5 03/15/2020   Femofibrate 160 mg daily  Lovaza 1 g daily  Atorvastatin 80 mg daily   Low HDL is baseline   Occ cheese burger-not often  Occ fried foods   Kidney visit today  Watching calcium level  Stage 3a kidney disease   Patient Active Problem List   Diagnosis Date Noted   Colon cancer screening 03/06/2019   Prostate cancer screening 10/23/2018   Chronic kidney disease, stage 3a ([place]) 05/02/2018   Family history of coronary artery disease 02/17/2018    Obesity (BMI 30-39.9) 10/25/2017   Sickle cell trait ([place]) 07/26/2017   Routine general medical examination at a health care facility 11/08/2016   Low testosterone in male 06/02/2016   Low libido 05/22/2016   CAD S/P percutaneous coronary angioplasty    Hyperlipidemia associated with type 2 diabetes mellitus ([place])    Type 2 diabetes with nephropathy ([place])    Status post coronary artery stent placement    Acute MI inferior lateral first episode care ([place]) 01/18/2016   Screen for STD (sexually transmitted disease) 01/12/2011   ALLERGIC RHINITIS 11/11/2007   Hypertriglyceridemia 03/04/2007   Past Medical History:  Diagnosis Date   Allergy    CAD (coronary artery disease)    a. 01/2016: Peak troponin 23.15. Left heart cath on 01/21/16 showed 100% occluded mid-dist RCA occlusion with heavy thrombus burden s/p DES. Rx asp thrombectomy and post procedure Aggrastat. Re look 01/21/16 revealed persistent thrombus in prox aspect of stent as well as a distal lesion both of which were intervened on successfully with placement of 2 additional DES.   Diabetes mellitus ([place])    Exposure to COVID-19 virus 06/05/2019   Hyperglycemia    Hyperlipidemia    triglyceriedes    Hypertriglyceridemia    Obesity  Sickle cell trait [facility])    Past Surgical History:  Procedure Laterality Date   ANKLE SURGERY     CARDIAC CATHETERIZATION N/A 01/18/2016   Procedure: Left Heart Cath and Coronary Angiography;  Surgeon: Jettie Booze, MD;  Location: [place] [facility];  Service: Cardiovascular;  Laterality: N/A;   CARDIAC CATHETERIZATION N/A 01/18/2016   Procedure: Coronary Stent Intervention;  Surgeon: Jettie Booze, MD;  Location: [place] [facility];  Service: Cardiovascular;  Laterality: N/A;  MID RCA   CARDIAC CATHETERIZATION N/A 01/21/2016   Procedure: Left Heart Cath and Coronary Angiography;  Surgeon: Troy Sine, MD;  Location: [place] [facility];  Service: Cardiovascular;  Laterality: N/A;   CARDIAC  CATHETERIZATION N/A 01/21/2016   Procedure: Coronary Stent Intervention;  Surgeon: Troy Sine, MD;  Location: [place] [facility];  Service: Cardiovascular;  Laterality: N/A;   Social History   Tobacco Use   Smoking status: Former    Types: Cigars    Quit date: 01/18/2012    Years since quitting: 9.0   Smokeless tobacco: Never  Substance Use Topics   Alcohol use: Yes    Alcohol/week: 0.0 standard drinks    Comment: rare   Drug use: No   Family History  Problem Relation Age of Onset   Coronary artery disease Mother    Heart attack Mother    Hyperlipidemia Mother    Alcohol abuse Father    Hyperlipidemia Father    Hypertension Father    Hypertension Brother    Cancer Maternal Grandmother        brain   No Known Allergies Current Outpatient Medications on File Prior to Visit  Medication Sig Dispense Refill   aspirin EC 81 MG tablet Take 81 mg by mouth daily.     atorvastatin (LIPITOR) 80 MG tablet TAKE 1 TABLET BY MOUTH DAILY AT 6 PM 90 tablet 2   blood glucose meter kit and supplies Dispense based on patient and insurance preference. Use up to four times daily as directed. (FOR ICD-10 E10.9, E11.9). 1 each 0   fenofibrate 160 MG tablet TAKE 1 TABLET BY MOUTH EVERY DAY 90 tablet 3   glucose blood test strip Use as instructed to check blood sugar twice daily and prn for diabetes 100 each 3   JARDIANCE 10 MG TABS tablet TAKE 1 TABLET BY MOUTH EVERY DAY BEFORE BREAKFAST 30 tablet 3   metoprolol tartrate (LOPRESSOR) 25 MG tablet TAKE 1 TABLET BY MOUTH TWICE A DAY 60 tablet 7   nitroGLYCERIN (NITROSTAT) 0.4 MG SL tablet PLACE 1 TABLET UNDER THE TONGUE EVERY 5 MINUTES AS NEEDED FOR CHEST PAIN (3 DOSES MAX). 25 tablet 2   omega-3 acid ethyl esters (LOVAZA) 1 g capsule TAKE 1 CAPSULE BY MOUTH 2 TIMES DAILY 60 capsule 5   No current facility-administered medications on file prior to visit.    Review of Systems  Constitutional:  Positive for fatigue. Negative for activity change,  appetite change, fever and unexpected weight change.  HENT:  Negative for congestion, rhinorrhea, sore throat and trouble swallowing.   Eyes:  Negative for pain, redness, itching and visual disturbance.  Respiratory:  Negative for cough, chest tightness, shortness of breath and wheezing.   Cardiovascular:  Negative for chest pain and palpitations.  Gastrointestinal:  Negative for abdominal pain, blood in stool, constipation, diarrhea and nausea.  Endocrine: Negative for cold intolerance, heat intolerance, polydipsia and polyuria.  Genitourinary:  Negative for difficulty urinating, dysuria, frequency and urgency.  Musculoskeletal:  Negative for arthralgias, joint swelling and myalgias.  Skin:  Negative for pallor and rash.  Neurological:  Negative for dizziness, tremors, weakness, numbness and headaches.  Hematological:  Negative for adenopathy. Does not bruise/bleed easily.  Psychiatric/Behavioral:  Negative for decreased concentration and dysphoric mood. The patient is not nervous/anxious.       Objective:   Physical Exam Constitutional:      General: He is not in acute distress.    Appearance: Normal appearance. He is well-developed. He is obese. He is not ill-appearing or diaphoretic.  HENT:     Head: Normocephalic and atraumatic.  Eyes:     Conjunctiva/sclera: Conjunctivae normal.     Pupils: Pupils are equal, round, and reactive to light.  Neck:     Thyroid: No thyromegaly.     Vascular: No carotid bruit or JVD.  Cardiovascular:     Rate and Rhythm: Normal rate and regular rhythm.     Heart sounds: Normal heart sounds.    No gallop.  Pulmonary:     Effort: Pulmonary effort is normal. No respiratory distress.     Breath sounds: Normal breath sounds. No wheezing or rales.  Abdominal:     General: Bowel sounds are normal. There is no distension or abdominal bruit.     Palpations: Abdomen is soft. There is no mass.     Tenderness: There is no abdominal tenderness.   Musculoskeletal:     Cervical back: Normal range of motion and neck supple.     Right lower leg: No edema.     Left lower leg: No edema.  Lymphadenopathy:     Cervical: No cervical adenopathy.  Skin:    General: Skin is warm and dry.     Coloration: Skin is not pale.     Findings: No rash.  Neurological:     Mental Status: He is alert.     Coordination: Coordination normal.     Deep Tendon Reflexes: Reflexes are normal and symmetric. Reflexes normal.  Psychiatric:        Mood and Affect: Mood normal.          Assessment & Plan:   Problem List Items Addressed This Visit       Endocrine   Hyperlipidemia associated with type 2 diabetes mellitus ([place])    Disc goals for lipids and reasons to control them Rev last labs with pt Rev low sat fat diet in detail Lab today  Taking  Fenofibrate 160 mg daily  lovaza 1 6 daily  Atorvastatin 80 mg daily  Good diet Lab today      Relevant Orders   Comprehensive metabolic panel   Lipid panel   Type 2 diabetes with nephropathy (HCC) - Primary    Good glucose readings at home Still working hard on diet and exercise and wt loss Eye exam up to date Continues jardiance 10 mg daily Disc cutting back bread  Taking a statin  Seeing a nephrologist for kidney care  A1C drawn         Genitourinary   Chronic kidney disease, stage 3a ([place])    Seeing nephrology (had visit today lab pending)  Disc imp of water intake Avoid nsaids          Other   Obesity (BMI 30-39.9)    Discussed how this problem influences overall health and the risks it imposes  Reviewed plan for weight loss with lower calorie diet (via better food choices and also portion control or program  like weight watchers) and exercise building up to or more than 30 minutes 5 days per week including some aerobic activity         Other Visit Diagnoses     Need for influenza vaccination       Relevant Orders   Flu Vaccine QUAD 6+ mos PF IM (Fluarix Quad PF)  (Completed)

## 2021-02-11 LAB — COMPREHENSIVE METABOLIC PANEL
ALT: 29 U/L (ref 0–53)
AST: 20 U/L (ref 0–37)
Albumin: 4.5 g/dL (ref 3.5–5.2)
Alkaline Phosphatase: 64 U/L (ref 39–117)
BUN: 23 mg/dL (ref 6–23)
CO2: 22 mEq/L (ref 19–32)
Calcium: 9.7 mg/dL (ref 8.4–10.5)
Chloride: 110 mEq/L (ref 96–112)
Creatinine, Ser: 1.61 mg/dL — ABNORMAL HIGH (ref 0.40–1.50)
GFR: 49.02 mL/min — ABNORMAL LOW (ref >60.00)
Glucose, Bld: 126 mg/dL — ABNORMAL HIGH (ref 70–99)
Potassium: 3.9 mEq/L (ref 3.5–5.1)
Sodium: 141 mEq/L (ref 135–145)
Total Bilirubin: 0.5 mg/dL (ref 0.2–1.2)
Total Protein: 7.7 g/dL (ref 6.0–8.3)

## 2021-02-11 LAB — HEMOGLOBIN A1C: Hgb A1c MFr Bld: 5.9 % (ref 4.6–6.5)

## 2021-02-11 LAB — LIPID PANEL
Cholesterol: 137 mg/dL (ref 0–200)
HDL: 31.5 mg/dL — ABNORMAL LOW (ref >39.00)
NonHDL: 105.42
Total CHOL/HDL Ratio: 4
Triglycerides: 204 mg/dL — ABNORMAL HIGH (ref 0.0–149.0)
VLDL: 40.8 mg/dL — ABNORMAL HIGH (ref 0.0–40.0)

## 2021-02-11 LAB — LDL CHOLESTEROL, DIRECT: Direct LDL: 58 mg/dL

## 2021-02-22 ENCOUNTER — Other Ambulatory Visit: Payer: Self-pay | Admitting: Family Medicine

## 2021-03-30 ENCOUNTER — Other Ambulatory Visit: Payer: Self-pay | Admitting: Family Medicine

## 2021-04-27 ENCOUNTER — Other Ambulatory Visit: Payer: Self-pay | Admitting: Family Medicine

## 2021-04-27 DIAGNOSIS — E11319 Type 2 diabetes mellitus with unspecified diabetic retinopathy without macular edema: Secondary | ICD-10-CM

## 2021-04-28 IMAGING — US US RENAL
1 series · 14 of 25 positions shown · non-contrast
Comparison: None

CLINICAL DATA: Stage IIIA CKD in this 49-year-old male

EXAM:
RENAL / URINARY TRACT ULTRASOUND COMPLETE

[Series 1: us renal · 0.30mm/px · 14 of 35 slices shown]
[im 1/35]
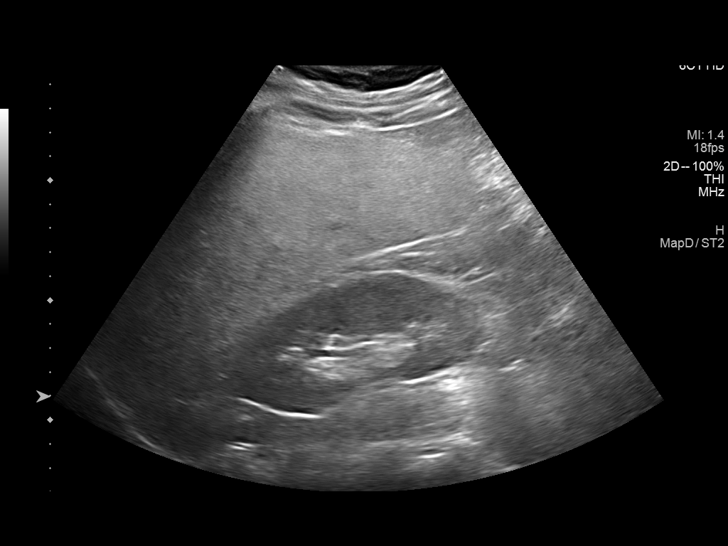
[im 3/35]
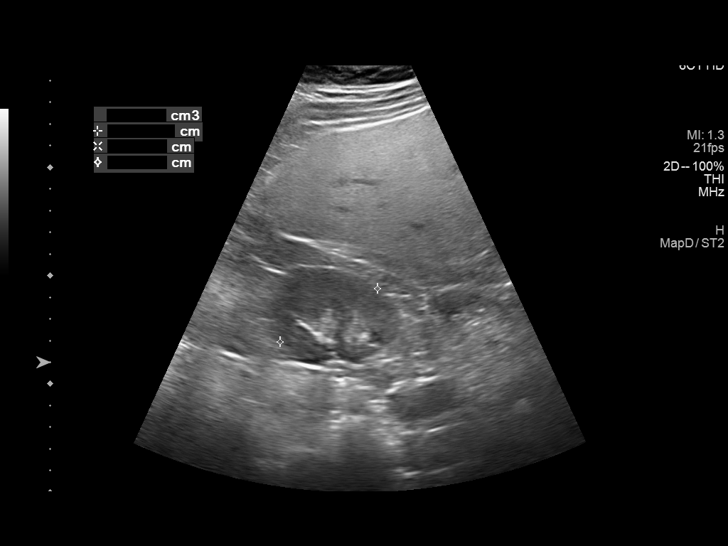
[im 6/35]
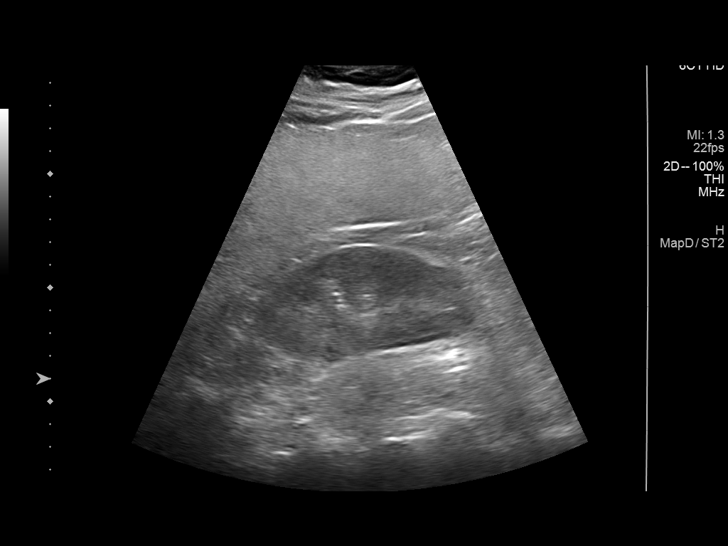
[im 9/35]
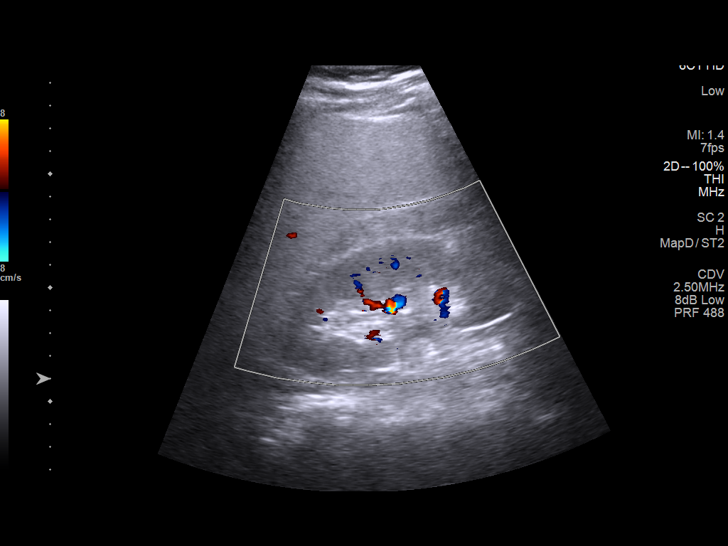
[im 12/35]
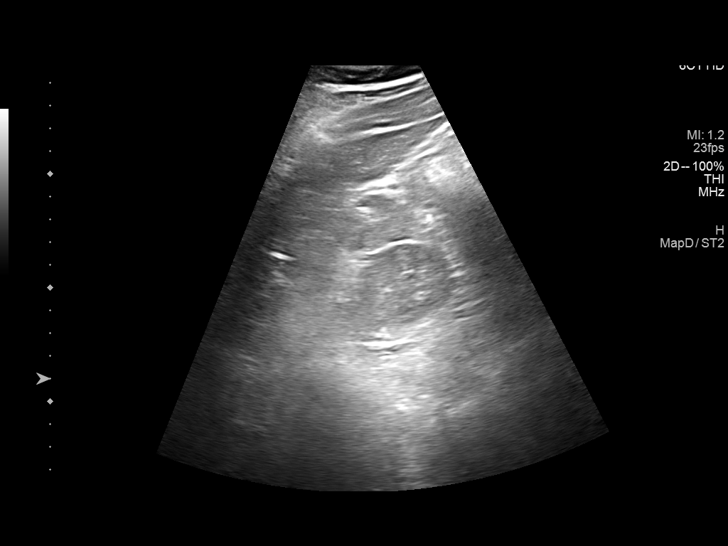
[im 13/35]
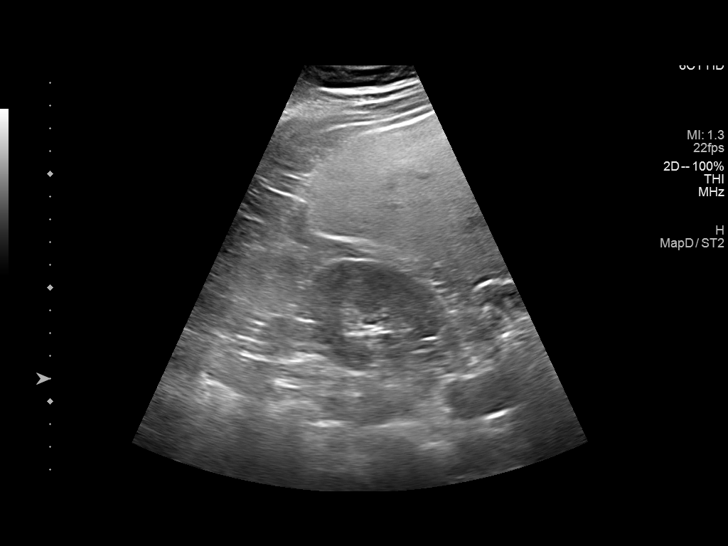
[im 16/35]
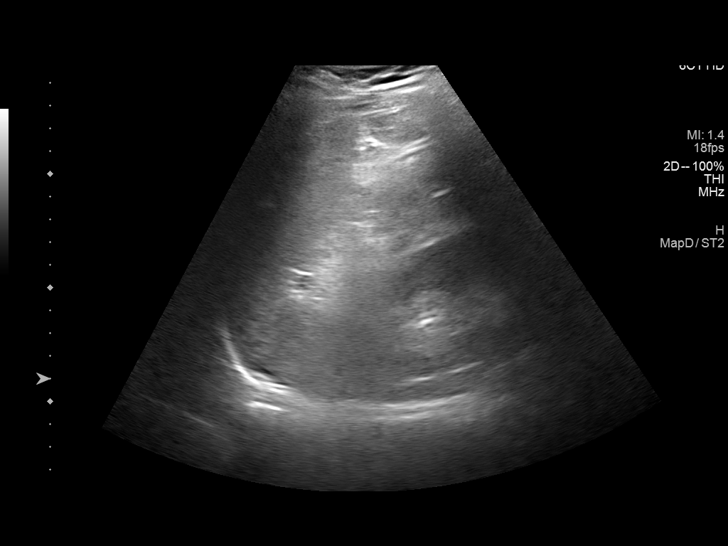
[im 19/35]
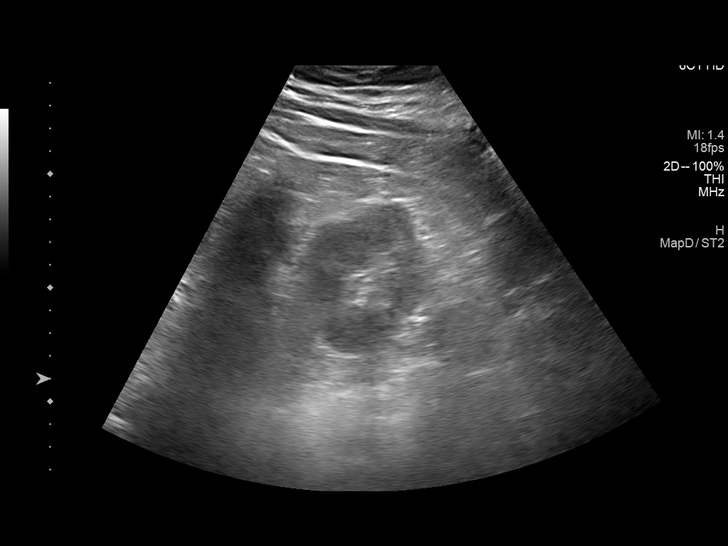
[im 22/35]
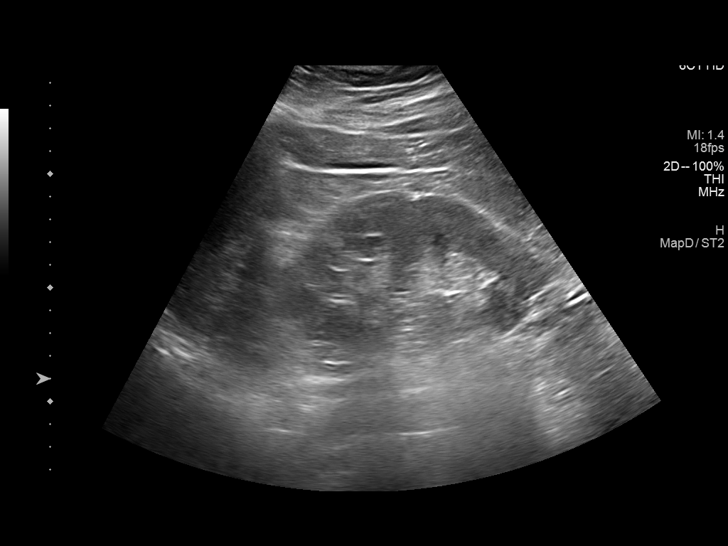
[im 23/35]
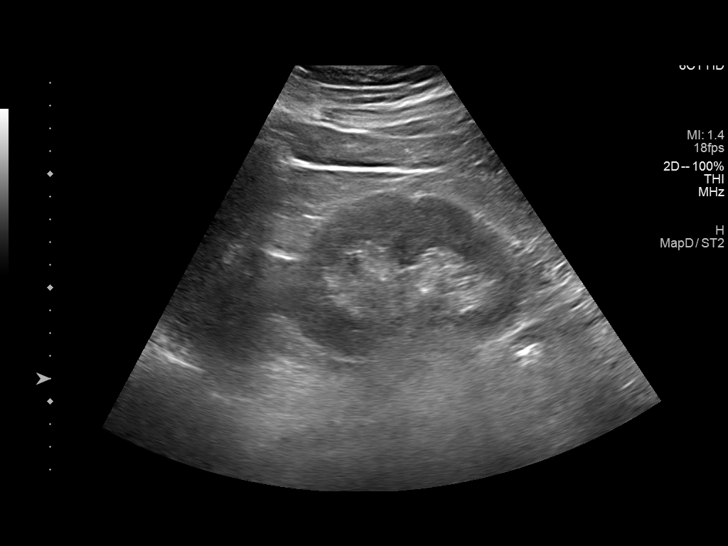
[im 26/35]
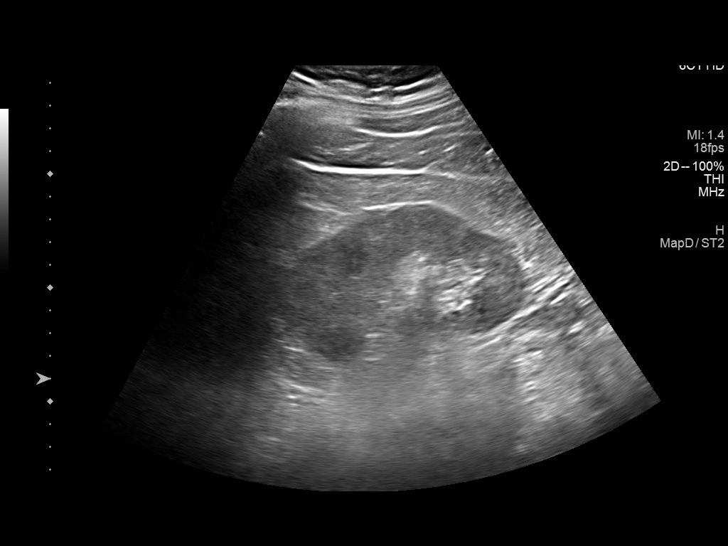
[im 29/35]
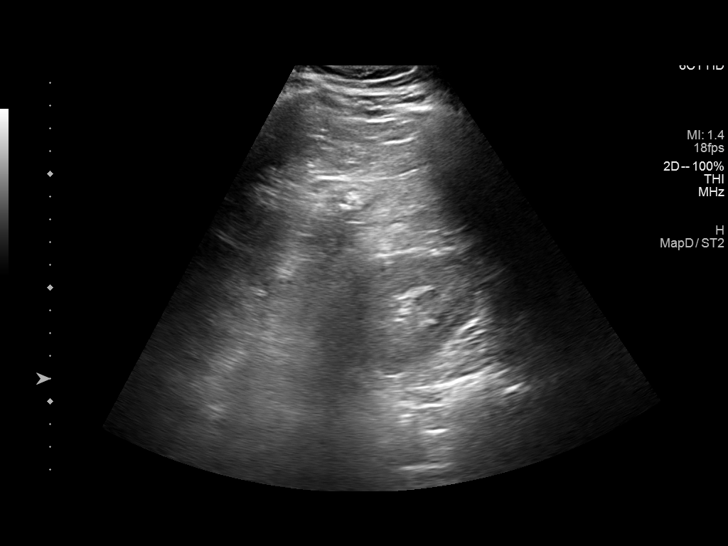
[im 32/35]
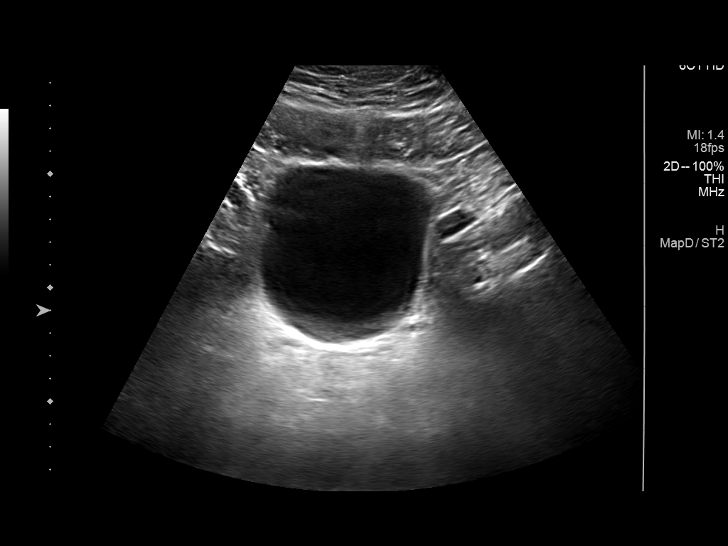
[im 35/35]
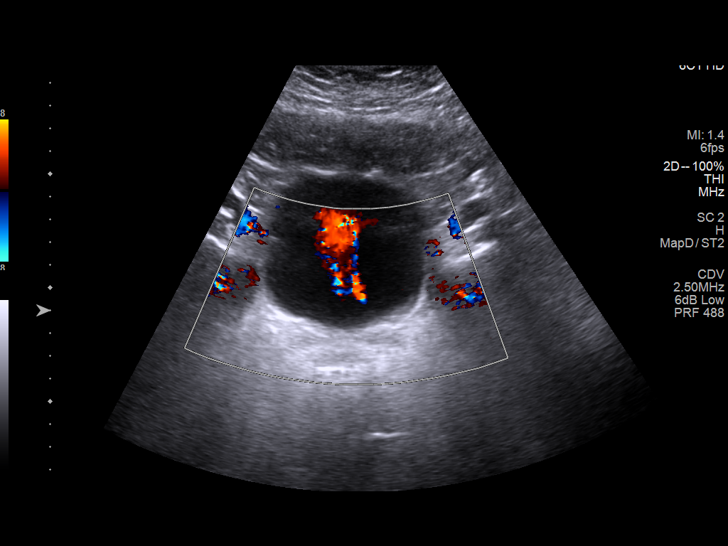

[14 of 25 positions shown; findings below may reference images not displayed]

FINDINGS: Right Kidney:

Renal measurements: 11.7 x 5.0 x 5.1 cm = volume: 156 mL.
Echogenicity within normal limits. No mass or hydronephrosis
visualized.

Left Kidney:

Renal measurements: 12.3 x 6.2 x 5.7 cm = volume: 226 mL.
Echogenicity within normal limits. No mass or hydronephrosis
visualized.

Bladder:

Appears normal for degree of bladder distention.

Other:

Marked hepatic steatosis with echogenic liver adjacent RIGHT kidney.
IMPRESSION: Normal renal sonogram.

Marked hepatic steatosis.

## 2021-06-24 ENCOUNTER — Other Ambulatory Visit: Payer: Self-pay | Admitting: Family Medicine

## 2021-07-14 ENCOUNTER — Other Ambulatory Visit: Payer: Self-pay | Admitting: Family Medicine

## 2021-08-13 ENCOUNTER — Telehealth: Payer: Self-pay | Admitting: Family Medicine

## 2021-08-13 NOTE — Telephone Encounter (Signed)
Pt called stating that he would like to know his blood type. Please advise. ?

## 2021-08-13 NOTE — Telephone Encounter (Signed)
Pt advised that we do not check blood type at the office. No record of blood type in chart  ?

## 2021-09-15 ENCOUNTER — Other Ambulatory Visit: Payer: Self-pay | Admitting: Family Medicine

## 2021-09-15 DIAGNOSIS — E11319 Type 2 diabetes mellitus with unspecified diabetic retinopathy without macular edema: Secondary | ICD-10-CM

## 2021-09-15 DIAGNOSIS — Z1211 Encounter for screening for malignant neoplasm of colon: Secondary | ICD-10-CM

## 2021-09-15 MED ORDER — ONETOUCH ULTRA VI STRP: ORAL_STRIP | 1 refills | Status: DC

## 2021-09-15 NOTE — Telephone Encounter (Signed)
Pt called to get meds refilled (done) ? ?Pt did have another question. Pt said he's never had a colonoscopy and he is asking if PCP would order that test. Pt lives in [place] and would like to have it done there.  ?

## 2021-09-16 ENCOUNTER — Encounter: Payer: Self-pay | Admitting: *Deleted

## 2021-09-16 NOTE — Telephone Encounter (Signed)
The order is in  ?Please give him the # for [facility]  ? ?It will take a while to get in ? ?

## 2021-09-22 NOTE — Telephone Encounter (Signed)
Left a message on voicemail for patient to call the office back. 

## 2021-09-26 ENCOUNTER — Encounter: Payer: Self-pay | Admitting: Gastroenterology

## 2021-10-02 ENCOUNTER — Other Ambulatory Visit: Payer: Self-pay | Admitting: Family Medicine

## 2021-10-02 ENCOUNTER — Other Ambulatory Visit: Payer: Self-pay | Admitting: Cardiology

## 2021-10-27 ENCOUNTER — Ambulatory Visit (AMBULATORY_SURGERY_CENTER): Payer: 59 | Admitting: *Deleted

## 2021-10-27 VITALS — Ht 72.0 in | Wt 227.0 lb

## 2021-10-27 DIAGNOSIS — Z1211 Encounter for screening for malignant neoplasm of colon: Secondary | ICD-10-CM

## 2021-10-27 MED ORDER — NA SULFATE-K SULFATE-MG SULF 17.5-3.13-1.6 GM/177ML PO SOLN: 2.0000 | Freq: Once | ORAL | 0 refills | Status: AC

## 2021-10-27 NOTE — Progress Notes (Signed)
No egg or soy allergy known to patient  ?No issues known to pt with past sedation with any surgeries or procedures ?Patient denies ever being told they had issues or difficulty with intubation  ?No FH of Malignant Hyperthermia ?Pt is not on diet pills ?Pt is not on  home 02  ?Pt is not on blood thinners  ?Pt denies issues with constipation  ?No A fib or A flutter ? ? ?PV completed over the phone. Pt verified name, DOB, address and insurance during PV today.  ?Pt mailed instruction packet with copy of consent form to read and not return, and instructions.  ?Pt encouraged to call with questions or issues.  ?If pt has My chart, procedure instructions sent via My Chart  ?Insurance confirmed with pt at PV today   ?

## 2021-10-30 ENCOUNTER — Other Ambulatory Visit: Payer: Self-pay | Admitting: Cardiology

## 2021-11-15 ENCOUNTER — Other Ambulatory Visit: Payer: Self-pay | Admitting: Cardiology

## 2021-11-17 ENCOUNTER — Encounter: Payer: Self-pay | Admitting: Gastroenterology

## 2021-11-17 ENCOUNTER — Ambulatory Visit (AMBULATORY_SURGERY_CENTER): Payer: 59 | Admitting: Gastroenterology

## 2021-11-17 VITALS — BP 118/74 | HR 81 | Temp 97.8°F | Resp 12 | Ht 72.0 in | Wt 227.0 lb

## 2021-11-17 DIAGNOSIS — Z1211 Encounter for screening for malignant neoplasm of colon: Secondary | ICD-10-CM | POA: Diagnosis present

## 2021-11-17 DIAGNOSIS — D125 Benign neoplasm of sigmoid colon: Secondary | ICD-10-CM

## 2021-11-17 DIAGNOSIS — K640 First degree hemorrhoids: Secondary | ICD-10-CM

## 2021-11-17 DIAGNOSIS — D122 Benign neoplasm of ascending colon: Secondary | ICD-10-CM

## 2021-11-17 MED ORDER — SODIUM CHLORIDE 0.9 % IV SOLN: 500.0000 mL | Freq: Once | INTRAVENOUS | Status: DC

## 2021-11-17 NOTE — Op Note (Signed)
[facility] Patient Name: Andrew Burgess Procedure Date: 11/17/2021 9:54 AM MRN: [id number] Endoscopist: Gerrit Heck , MD Age: 51 Referring MD:  Date of Birth: [birth year] Gender: Male Account #: [id number] Procedure:                Colonoscopy Indications:              Screening for colorectal malignant neoplasm, This                            is the patient's first colonoscopy Medicines:                Monitored Anesthesia Care Procedure:                Pre-Anesthesia Assessment:                           - Prior to the procedure, a History and Physical                            was performed, and patient medications and                            allergies were reviewed. The patient's tolerance of                            previous anesthesia was also reviewed. The risks                            and benefits of the procedure and the sedation                            options and risks were discussed with the patient.                            All questions were answered, and informed consent                            was obtained. Prior Anticoagulants: The patient has                            taken no previous anticoagulant or antiplatelet                            agents except for aspirin. ASA Grade Assessment:                            III - A patient with severe systemic disease. After                            reviewing the risks and benefits, the patient was                            deemed in satisfactory condition to undergo the  procedure.                           After obtaining informed consent, the colonoscope                            was passed under direct vision. Throughout the                            procedure, the patient's blood pressure, pulse, and                            oxygen saturations were monitored continuously. The                            Olympus CF-HQ190L ([id number]) Colonoscope was                             introduced through the anus and advanced to the the                            cecum, identified by appendiceal orifice and                            ileocecal valve. The colonoscopy was performed                            without difficulty. The patient tolerated the                            procedure well. The quality of the bowel                            preparation was good. The ileocecal valve,                            appendiceal orifice, and rectum were photographed. Scope In: 10:02:53 AM Scope Out: 10:22:33 AM Scope Withdrawal Time: 0 hours 17 minutes 1 second  Total Procedure Duration: 0 hours 19 minutes 40 seconds  Findings:                 The perianal and digital rectal examinations were                            normal.                           Two sessile polyps were found in the sigmoid colon                            and ascending colon. The polyps were 3 to 4 mm in                            size. These polyps were removed with a cold snare.  Resection and retrieval were complete. Estimated                            blood loss was minimal.                           Non-bleeding internal hemorrhoids were found during                            retroflexion. The hemorrhoids were small. Complications:            No immediate complications. Estimated Blood Loss:     Estimated blood loss was minimal. Impression:               - Two 3 to 4 mm polyps in the sigmoid colon and in                            the ascending colon, removed with a cold snare.                            Resected and retrieved.                           - Non-bleeding internal hemorrhoids.                           - The remainder of the colon was normal appearing. Recommendation:           - Patient has a contact number available for                            emergencies. The signs and symptoms of potential                            delayed  complications were discussed with the                            patient. Return to normal activities tomorrow.                            Written discharge instructions were provided to the                            patient.                           - Resume previous diet.                           - Continue present medications.                           - Await pathology results.                           - Repeat colonoscopy for surveillance based on  pathology results.                           - Return to GI clinic PRN. Gerrit Heck, MD 11/17/2021 10:27:05 AM

## 2021-11-17 NOTE — Progress Notes (Unsigned)
PT taken to PACU. Monitors in place. VSS. Report given to RN. 

## 2021-11-17 NOTE — Progress Notes (Unsigned)
GASTROENTEROLOGY PROCEDURE H&P NOTE   Primary Care Physician: Tower, Wynelle Fanny, MD    Reason for Procedure:  Colon Cancer screening  Plan:    Colonoscopy  Patient is appropriate for endoscopic procedure(s) in the ambulatory ([facility]) setting.  The nature of the procedure, as well as the risks, benefits, and alternatives were carefully and thoroughly reviewed with the patient. Ample time for discussion and questions allowed. The patient understood, was satisfied, and agreed to proceed.     HPI: Andrew Burgess is a 51 y.o. male who presents for colonoscopy for routine Colon Cancer screening.  No active GI symptoms.  No known family history of colon cancer or related malignancy.  Patient is otherwise without complaints or active issues today.  Past Medical History:  Diagnosis Date   Allergy    CAD (coronary artery disease)    a. 01/2016: Peak troponin 23.15. Left heart cath on 01/21/16 showed 100% occluded mid-dist RCA occlusion with heavy thrombus burden s/p DES. Rx asp thrombectomy and post procedure Aggrastat. Re look 01/21/16 revealed persistent thrombus in prox aspect of stent as well as a distal lesion both of which were intervened on successfully with placement of 2 additional DES.   Diabetes mellitus ([place])    Exposure to COVID-19 virus 06/05/2019   Hyperglycemia    Hyperlipidemia    triglyceriedes    Hypertriglyceridemia    Myocardial infarction [facility]) 2017   Obesity    Sickle cell trait [facility])     Past Surgical History:  Procedure Laterality Date   ANKLE SURGERY     CARDIAC CATHETERIZATION N/A 01/18/2016   Procedure: Left Heart Cath and Coronary Angiography;  Surgeon: Jettie Booze, MD;  Location: [facility];  Service: Cardiovascular;  Laterality: N/A;   CARDIAC CATHETERIZATION N/A 01/18/2016   Procedure: Coronary Stent Intervention;  Surgeon: Jettie Booze, MD;  Location: [facility];  Service: Cardiovascular;  Laterality: N/A;  MID RCA   CARDIAC  CATHETERIZATION N/A 01/21/2016   Procedure: Left Heart Cath and Coronary Angiography;  Surgeon: Troy Sine, MD;  Location: [facility];  Service: Cardiovascular;  Laterality: N/A;   CARDIAC CATHETERIZATION N/A 01/21/2016   Procedure: Coronary Stent Intervention;  Surgeon: Troy Sine, MD;  Location: [facility];  Service: Cardiovascular;  Laterality: N/A;    Prior to Admission medications   Medication Sig Start Date End Date Taking? Authorizing Provider  aspirin EC 81 MG tablet Take 81 mg by mouth daily.   Yes [provider]  atorvastatin (LIPITOR) 80 MG tablet Take 1 tablet (80 mg total) by mouth daily. Schedule an appointment for further refills 10/30/21  Yes Crenshaw, Denice Bors, MD  blood glucose meter kit and supplies Dispense based on patient and insurance preference. Use up to four times daily as directed. (FOR ICD-10 E10.9, E11.9). 07/15/17  Yes Arrien, Jimmy Picket, MD  fenofibrate 160 MG tablet TAKE 1 TABLET BY MOUTH EVERY DAY 10/03/21  Yes Tower, Marne A, MD  glucose blood (ONETOUCH ULTRA) test strip USE AS INSTRUCTED TO CHECK BLOOD SUGAR ONCE DAILY. 09/15/21  Yes Tower, Wynelle Fanny, MD  JARDIANCE 10 MG TABS tablet TAKE 1 TABLET BY MOUTH EVERY DAY BEFORE BREAKFAST 09/15/21  Yes Tower, Wynelle Fanny, MD  metoprolol tartrate (LOPRESSOR) 25 MG tablet Take 1 tablet (25 mg total) by mouth 2 (two) times daily. PATIENT MUST SCHEDULE APPOINTMENT FOR FUTURE REFILLS. FIRST ATTEMPT 10/03/21  Yes Lelon Perla, MD  omega-3 acid ethyl esters (  LOVAZA) 1 g capsule TAKE 1 CAPSULE BY MOUTH TWICE A DAY 09/15/21  Yes Tower, Marne A, MD  nitroGLYCERIN (NITROSTAT) 0.4 MG SL tablet PLACE 1 TABLET UNDER THE TONGUE EVERY 5 MINUTES AS NEEDED FOR CHEST PAIN (3 DOSES MAX). 12/19/20   Tower, Wynelle Fanny, MD    Current Outpatient Medications  Medication Sig Dispense Refill   aspirin EC 81 MG tablet Take 81 mg by mouth daily.     atorvastatin (LIPITOR) 80 MG tablet Take 1 tablet (80 mg total) by mouth daily.  Schedule an appointment for further refills 15 tablet 0   blood glucose meter kit and supplies Dispense based on patient and insurance preference. Use up to four times daily as directed. (FOR ICD-10 E10.9, E11.9). 1 each 0   fenofibrate 160 MG tablet TAKE 1 TABLET BY MOUTH EVERY DAY 90 tablet 1   glucose blood (ONETOUCH ULTRA) test strip USE AS INSTRUCTED TO CHECK BLOOD SUGAR ONCE DAILY. 100 strip 1   JARDIANCE 10 MG TABS tablet TAKE 1 TABLET BY MOUTH EVERY DAY BEFORE BREAKFAST 90 tablet 1   metoprolol tartrate (LOPRESSOR) 25 MG tablet Take 1 tablet (25 mg total) by mouth 2 (two) times daily. PATIENT MUST SCHEDULE APPOINTMENT FOR FUTURE REFILLS. FIRST ATTEMPT 60 tablet 0   omega-3 acid ethyl esters (LOVAZA) 1 g capsule TAKE 1 CAPSULE BY MOUTH TWICE A DAY 180 capsule 0   nitroGLYCERIN (NITROSTAT) 0.4 MG SL tablet PLACE 1 TABLET UNDER THE TONGUE EVERY 5 MINUTES AS NEEDED FOR CHEST PAIN (3 DOSES MAX). 25 tablet 2   Current Facility-Administered Medications  Medication Dose Route Frequency Provider Last Rate Last Admin   0.9 %  sodium chloride infusion  500 mL Intravenous Once Cirigliano, Vito V, DO        Allergies as of 11/17/2021   (No Known Allergies)    Family History  Problem Relation Age of Onset   Coronary artery disease Mother    Heart attack Mother    Hyperlipidemia Mother    Alcohol abuse Father    Hyperlipidemia Father    Hypertension Father    Hypertension Brother    Cancer Maternal Grandmother        brain   Colon polyps Neg Hx    Colon cancer Neg Hx    Esophageal cancer Neg Hx    Stomach cancer Neg Hx    Rectal cancer Neg Hx     Social History   Socioeconomic History   Marital status: Married    Spouse name: Not on file   Number of children: 2   Years of education: Not on file   Highest education level: Not on file  Occupational History   Occupation: Truck driver  Tobacco Use   Smoking status: Former    Types: Cigars    Quit date: 01/18/2012    Years since  quitting: 9.8   Smokeless tobacco: Never  Vaping Use   Vaping Use: Never used  Substance and Sexual Activity   Alcohol use: Yes    Alcohol/week: 0.0 standard drinks    Comment: rare   Drug use: No   Sexual activity: Yes  Other Topics Concern   Not on file  Social History Narrative   Some caffeine    Social Determinants of Health   Financial Resource Strain: Not on file  Food Insecurity: Not on file  Transportation Needs: Not on file  Physical Activity: Not on file  Stress: Not on file  Social Connections: Not on file  Intimate Partner Violence: Not on file    Physical Exam: Vital signs in last 24 hours: _0  105/71   Pulse 85   Temp 97.8 F (36.6 C)   Ht 6' (1.829 m)   Wt 227 lb (103 kg)   SpO2 97%   BMI 30.79 kg/m  GEN: NAD EYE: Sclerae anicteric ENT: MMM CV: Non-tachycardic Pulm: CTA b/l GI: Soft, NT/ND NEURO:  Alert & Oriented x 3   Gerrit Heck, DO [facility]   11/17/2021 8:33 AM

## 2021-11-17 NOTE — Progress Notes (Unsigned)
Pt's states no medical or surgical changes since previsit or office visit. 

## 2021-11-17 NOTE — Progress Notes (Signed)
Called to room to assist during endoscopic procedure.  Patient ID and intended procedure confirmed with present staff. Received instructions for my participation in the procedure from the performing physician.  

## 2021-11-17 NOTE — Patient Instructions (Signed)
Handout on polyps and hemorrhoids given.   YOU HAD AN ENDOSCOPIC PROCEDURE TODAY AT [facility]:   Refer to the procedure report that was given to you for any specific questions about what was found during the examination.  If the procedure report does not answer your questions, please call your gastroenterologist to clarify.  If you requested that your care partner not be given the details of your procedure findings, then the procedure report has been included in a sealed envelope for you to review at your convenience later.  YOU SHOULD EXPECT: Some feelings of bloating in the abdomen. Passage of more gas than usual.  Walking can help get rid of the air that was put into your GI tract during the procedure and reduce the bloating. If you had a lower endoscopy (such as a colonoscopy or flexible sigmoidoscopy) you may notice spotting of blood in your stool or on the toilet paper. If you underwent a bowel prep for your procedure, you may not have a normal bowel movement for a few days.  Please Note:  You might notice some irritation and congestion in your nose or some drainage.  This is from the oxygen used during your procedure.  There is no need for concern and it should clear up in a day or so.  SYMPTOMS TO REPORT IMMEDIATELY:  Following lower endoscopy (colonoscopy or flexible sigmoidoscopy):  Excessive amounts of blood in the stool  Significant tenderness or worsening of abdominal pains  Swelling of the abdomen that is new, acute  Fever of 100F or higher  For urgent or emergent issues, a gastroenterologist can be reached at any hour by calling [phone]. Do not use MyChart messaging for urgent concerns.    DIET:  We do recommend a small meal at first, but then you may proceed to your regular diet.  Drink plenty of fluids but you should avoid alcoholic beverages for 24 hours.  ACTIVITY:  You should plan to take it easy for the rest of today and you should NOT DRIVE or  use heavy machinery until tomorrow (because of the sedation medicines used during the test).    FOLLOW UP: Our staff will call the number listed on your records 24-72 hours following your procedure to check on you and address any questions or concerns that you may have regarding the information given to you following your procedure. If we do not reach you, we will leave a message.  We will attempt to reach you two times.  During this call, we will ask if you have developed any symptoms of COVID 19. If you develop any symptoms (ie: fever, flu-like symptoms, shortness of breath, cough etc.) before then, please call [phone].  If you test positive for Covid 19 in the 2 weeks post procedure, please call and report this information to [place].    If any biopsies were taken you will be contacted by phone or by letter within the next 1-3 weeks.  Please call us at [phone] if you have not heard about the biopsies in 3 weeks.    SIGNATURES/CONFIDENTIALITY: You and/or your care partner have signed paperwork which will be entered into your electronic medical record.  These signatures attest to the fact that that the information above on your After Visit Summary has been reviewed and is understood.  Full responsibility of the confidentiality of this discharge information lies with you and/or your care-partner.

## 2021-11-18 ENCOUNTER — Telehealth: Payer: Self-pay | Admitting: *Deleted

## 2021-11-18 NOTE — Telephone Encounter (Signed)
  Follow up Call-     11/17/2021    7:41 AM  Call back number  Post procedure Call Back phone  # [phone]  Permission to leave phone message Yes     Patient questions:  Do you have a fever, pain , or abdominal swelling? No. Pain Score  0 *  Have you tolerated food without any problems? Yes.    Have you been able to return to your normal activities? Yes.    Do you have any questions about your discharge instructions: Diet   No. Medications  No. Follow up visit  No.  Do you have questions or concerns about your Care? No.  Actions: * If pain score is 4 or above: No action needed, pain <4.

## 2021-11-19 ENCOUNTER — Ambulatory Visit (HOSPITAL_COMMUNITY)
Admission: EM | Admit: 2021-11-19 | Discharge: 2021-11-19 | Disposition: A | Discharge status: Home / Self Care | Payer: 59 | Attending: Physician Assistant | Admitting: Physician Assistant

## 2021-11-19 ENCOUNTER — Encounter (HOSPITAL_COMMUNITY): Payer: Self-pay | Admitting: Emergency Medicine

## 2021-11-19 DIAGNOSIS — R051 Acute cough: Secondary | ICD-10-CM

## 2021-11-19 DIAGNOSIS — J40 Bronchitis, not specified as acute or chronic: Secondary | ICD-10-CM

## 2021-11-19 MED ORDER — ALBUTEROL SULFATE HFA 108 (90 BASE) MCG/ACT IN AERS
2.0000 | INHALATION_SPRAY | Freq: Once | RESPIRATORY_TRACT | Status: AC
  Administered 2021-11-19: 2 via RESPIRATORY_TRACT

## 2021-11-19 MED ORDER — PREDNISONE 20 MG PO TABS
20.0000 mg | ORAL_TABLET | Freq: Every day | ORAL | 0 refills | Status: AC
Start: 2021-11-19 — End: 2021-11-22

## 2021-11-19 MED ORDER — PROMETHAZINE-DM 6.25-15 MG/5ML PO SYRP: 5.0000 mL | ORAL_SOLUTION | Freq: Four times a day (QID) | ORAL | 0 refills | Status: DC | PRN

## 2021-11-19 MED ORDER — ALBUTEROL SULFATE HFA 108 (90 BASE) MCG/ACT IN AERS
INHALATION_SPRAY | RESPIRATORY_TRACT | Status: AC
  Filled 2021-11-19: qty 6.7

## 2021-11-19 NOTE — ED Triage Notes (Signed)
Pt is present today with c/o cough, nasal congestion, and bilateral eye irritation. Pt sx started x4 days ago

## 2021-11-19 NOTE — Discharge Instructions (Signed)
I believe that you have viral bronchitis.  Please use your albuterol inhaler every 4-6 hours as needed.  Take prednisone in the morning for the next 3 days.  This will raise your blood sugar so please make sure you drink plenty of fluids and avoid carbohydrates.  If your blood sugars become very elevated please stop the medication and be seen.  Take Promethazine DM for cough.  This will make you sleepy so do not drive or drink alcohol while taking it.  You can use Tylenol, Mucinex, Flonase for additional symptom relief.  Make sure you rest and drink plenty of fluid.  If your symptoms or not improving within a week please return for reevaluation.  If anything worsens and you develop chest pain, shortness of breath, nausea, vomiting, worsening cough, high fever not responding to medication you should be seen immediately.

## 2021-11-19 NOTE — ED Provider Notes (Signed)
[place]    CSN: [id number] Arrival date & time: 11/19/21  0940      History   Chief Complaint Chief Complaint  Patient presents with   Conjunctivitis   Cough   Nasal Congestion    HPI Andrew Burgess is a 51 y.o. male.   Patient presents today with a 4 to 5-day history of URI symptoms.  He reports some nasal congestion, cough, chest tightness.  Denies any fever, nausea, vomiting, chest pain, shortness of breath.  He has tried over-the-counter allergy medication with minimal improvement of symptoms as well as multisymptom medication.  He reports his mother was sick with similar symptoms but denies additional known sick contacts.  He has not had COVID in the past.  Has had COVID-19 vaccine.  Denies any recent antibiotic use.  He denies history of asthma, COPD, smoking.  He is not taking allergy medication on a regular basis but does have a history of allergies.  He does have a history of diabetes but reports that his blood sugars are adequately controlled; checks them approximately twice a day and are typically around 110.  Denies any recent antibiotic or steroid use.   Past Medical History:  Diagnosis Date   Allergy    CAD (coronary artery disease)    a. 01/2016: Peak troponin 23.15. Left heart cath on 01/21/16 showed 100% occluded mid-dist RCA occlusion with heavy thrombus burden s/p DES. Rx asp thrombectomy and post procedure Aggrastat. Re look 01/21/16 revealed persistent thrombus in prox aspect of stent as well as a distal lesion both of which were intervened on successfully with placement of 2 additional DES.   Diabetes mellitus ([place])    Exposure to COVID-19 virus 06/05/2019   Hyperglycemia    Hyperlipidemia    triglyceriedes    Hypertriglyceridemia    Myocardial infarction ([place]) 2017   Obesity    Sickle cell trait [facility])     Patient Active Problem List   Diagnosis Date Noted   Colon cancer screening 03/06/2019   Prostate cancer screening 10/23/2018   Chronic  kidney disease, stage 3a ([place]) 05/02/2018   Family history of coronary artery disease 02/17/2018   Obesity (BMI 30-39.9) 10/25/2017   Sickle cell trait ([place]) 07/26/2017   Routine general medical examination at a health care facility 11/08/2016   Low testosterone in male 06/02/2016   Low libido 05/22/2016   CAD S/P percutaneous coronary angioplasty    Hyperlipidemia associated with type 2 diabetes mellitus ([place])    Type 2 diabetes with nephropathy ([place])    Status post coronary artery stent placement    Acute MI inferior lateral first episode care ([place]) 01/18/2016   Screen for STD (sexually transmitted disease) 01/12/2011   ALLERGIC RHINITIS 11/11/2007   Hypertriglyceridemia 03/04/2007    Past Surgical History:  Procedure Laterality Date   ANKLE SURGERY     CARDIAC CATHETERIZATION N/A 01/18/2016   Procedure: Left Heart Cath and Coronary Angiography;  Surgeon: Jettie Booze, MD;  Location: [place] [facility] LAB;  Service: Cardiovascular;  Laterality: N/A;   CARDIAC CATHETERIZATION N/A 01/18/2016   Procedure: Coronary Stent Intervention;  Surgeon: Jettie Booze, MD;  Location: [place] CV LAB;  Service: Cardiovascular;  Laterality: N/A;  MID RCA   CARDIAC CATHETERIZATION N/A 01/21/2016   Procedure: Left Heart Cath and Coronary Angiography;  Surgeon: Troy Sine, MD;  Location: [place] [facility] LAB;  Service: Cardiovascular;  Laterality: N/A;   CARDIAC CATHETERIZATION N/A 01/21/2016   Procedure: Coronary Stent  Intervention;  Surgeon: Troy Sine, MD;  Location: [place] [facility];  Service: Cardiovascular;  Laterality: N/A;       Home Medications    Prior to Admission medications   Medication Sig Start Date End Date Taking? Authorizing Provider  predniSONE (DELTASONE) 20 MG tablet Take 1 tablet (20 mg total) by mouth daily for 3 days. 11/19/21 11/22/21 Yes Tyrie Porzio K, PA-C  promethazine-dextromethorphan (PROMETHAZINE-DM) 6.25-15 MG/5ML syrup Take 5 mLs by mouth 4 (four)  times daily as needed for cough. 11/19/21  Yes Dannis Deroche, Derry Skill, PA-C  aspirin EC 81 MG tablet Take 81 mg by mouth daily.    [provider]  atorvastatin (LIPITOR) 80 MG tablet Take 1 tablet (80 mg total) by mouth daily. Schedule an appointment for further refills 10/30/21   Lelon Perla, MD  blood glucose meter kit and supplies Dispense based on patient and insurance preference. Use up to four times daily as directed. (FOR ICD-10 E10.9, E11.9). 07/15/17   Arrien, Jimmy Picket, MD  fenofibrate 160 MG tablet TAKE 1 TABLET BY MOUTH EVERY DAY 10/03/21   Tower, Roque Lias A, MD  glucose blood (ONETOUCH ULTRA) test strip USE AS INSTRUCTED TO CHECK BLOOD SUGAR ONCE DAILY. 09/15/21   Tower, Wynelle Fanny, MD  JARDIANCE 10 MG TABS tablet TAKE 1 TABLET BY MOUTH EVERY DAY BEFORE BREAKFAST 09/15/21   Tower, Roque Lias A, MD  metoprolol tartrate (LOPRESSOR) 25 MG tablet TAKE 1 TABLET 2 TIMES DAILY. PATIENT MUST SCHEDULE APPOINTMENT FOR FUTURE REFILLS. FIRST ATTEMPT 11/17/21   Lelon Perla, MD  nitroGLYCERIN (NITROSTAT) 0.4 MG SL tablet PLACE 1 TABLET UNDER THE TONGUE EVERY 5 MINUTES AS NEEDED FOR CHEST PAIN (3 DOSES MAX). 12/19/20   Tower, Wynelle Fanny, MD  omega-3 acid ethyl esters (LOVAZA) 1 g capsule TAKE 1 CAPSULE BY MOUTH TWICE A DAY 09/15/21   Tower, Wynelle Fanny, MD    Family History Family History  Problem Relation Age of Onset   Coronary artery disease Mother    Heart attack Mother    Hyperlipidemia Mother    Alcohol abuse Father    Hyperlipidemia Father    Hypertension Father    Hypertension Brother    Cancer Maternal Grandmother        brain   Colon polyps Neg Hx    Colon cancer Neg Hx    Esophageal cancer Neg Hx    Stomach cancer Neg Hx    Rectal cancer Neg Hx     Social History Social History   Tobacco Use   Smoking status: Former    Types: Cigars    Quit date: 01/18/2012    Years since quitting: 9.8   Smokeless tobacco: Never  Vaping Use   Vaping Use: Never used  Substance Use Topics   Alcohol  use: Yes    Alcohol/week: 0.0 standard drinks    Comment: rare   Drug use: No     Allergies   Patient has no known allergies.   Review of Systems Review of Systems  Constitutional:  Positive for activity change. Negative for appetite change, fatigue and fever.  HENT:  Positive for congestion, postnasal drip and sore throat. Negative for sinus pressure and sneezing.   Respiratory:  Positive for cough and chest tightness. Negative for shortness of breath.   Cardiovascular:  Negative for chest pain.  Gastrointestinal:  Negative for abdominal pain, diarrhea, nausea and vomiting.  Neurological:  Negative for dizziness, light-headedness and headaches.    Physical Exam Triage Vital  Signs ED Triage Vitals  Enc Vitals Group     BP 11/19/21 1046 135/80     Pulse Rate 11/19/21 1046 90     Resp 11/19/21 1046 16     Temp 11/19/21 1046 98.4 F (36.9 C)     Temp src --      SpO2 11/19/21 1046 97 %     Weight --      Height --      Head Circumference --      Peak Flow --      Pain Score 11/19/21 1045 0     Pain Loc --      Pain Edu? --      Excl. in [place]? --    No data found.  Updated Vital Signs BP 135/80   Pulse 90   Temp 98.4 F (36.9 C)   Resp 16   SpO2 97%   Visual Acuity Right Eye Distance:   Left Eye Distance:   Bilateral Distance:    Right Eye Near:   Left Eye Near:    Bilateral Near:     Physical Exam Vitals reviewed.  Constitutional:      General: He is awake.     Appearance: Normal appearance. He is well-developed. He is not ill-appearing.     Comments: Very pleasant male appears stated age in no acute distress sitting comfortably in exam room  HENT:     Head: Normocephalic and atraumatic.     Right Ear: Tympanic membrane, ear canal and external ear normal. Tympanic membrane is not erythematous or bulging.     Left Ear: Tympanic membrane, ear canal and external ear normal. Tympanic membrane is not erythematous or bulging.     Nose: Nose normal.      Mouth/Throat:     Pharynx: Uvula midline. Posterior oropharyngeal erythema present. No oropharyngeal exudate.  Cardiovascular:     Rate and Rhythm: Normal rate and regular rhythm.     Heart sounds: Normal heart sounds, S1 normal and S2 normal. No murmur heard. Pulmonary:     Effort: Pulmonary effort is normal. No accessory muscle usage or respiratory distress.     Breath sounds: Normal breath sounds. No stridor. No wheezing, rhonchi or rales.     Comments: Reactive cough with deep breathing Abdominal:     General: Bowel sounds are normal.     Palpations: Abdomen is soft.     Tenderness: There is no abdominal tenderness.  Neurological:     Mental Status: He is alert.  Psychiatric:        Behavior: Behavior is cooperative.     UC Treatments / Results  Labs (all labs ordered are listed, but only abnormal results are displayed) Labs Reviewed - No data to display  EKG   Radiology No results found.  Procedures Procedures (including critical care time)  Medications Ordered in UC Medications  albuterol (VENTOLIN HFA) 108 (90 Base) MCG/ACT inhaler 2 puff (2 puffs Inhalation Given 11/19/21 1146)    Initial Impression / Assessment and Plan / UC Course  I have reviewed the triage vital signs and the nursing notes.  Pertinent labs & imaging results that were available during my care of the patient were reviewed by me and considered in my medical decision making (see chart for details).     No indication for viral testing as patient has been symptomatic for 5+ days and this would not change management.  No evidence of acute infection on physical exam that would warrant  initiation of antibiotics.  Chest x-ray was deferred as patient had no significant adventitious lung sounds.  He was given albuterol inhaler with improvement of reactive cough.  Recommend using this at home every 4-6 hours as needed.  Discussed likely viral etiology of symptoms.  Recommended Promethazine DM for cough with  instruction not to drive or drink alcohol while taking this medication as drowsiness is a common side effect.  Can use Mucinex, Tylenol, Flonase for additional symptom relief.  He was encouraged to use daily antihistamine to manage allergy symptoms.  We will do low-dose prednisone (20 mg for 3 days).  Discussed that this will cause elevated blood sugars and he is to avoid carbohydrates and drink plenty of fluid.  If he develops any severe hyperglycemia he is to stop the medication and be seen.  Discussed that if symptoms or not improving within a week he should return for reevaluation.  If at any point anything worsens and he has high fever, chest pain, shortness of breath, worsening cough, nausea, vomiting he needs to be seen immediately to which he expressed understanding.  Work excuse note provided.  Final Clinical Impressions(s) / UC Diagnoses   Final diagnoses:  Bronchitis  Acute cough     Discharge Instructions      I believe that you have viral bronchitis.  Please use your albuterol inhaler every 4-6 hours as needed.  Take prednisone in the morning for the next 3 days.  This will raise your blood sugar so please make sure you drink plenty of fluids and avoid carbohydrates.  If your blood sugars become very elevated please stop the medication and be seen.  Take Promethazine DM for cough.  This will make you sleepy so do not drive or drink alcohol while taking it.  You can use Tylenol, Mucinex, Flonase for additional symptom relief.  Make sure you rest and drink plenty of fluid.  If your symptoms or not improving within a week please return for reevaluation.  If anything worsens and you develop chest pain, shortness of breath, nausea, vomiting, worsening cough, high fever not responding to medication you should be seen immediately.     ED Prescriptions     Medication Sig Dispense Auth. Provider   promethazine-dextromethorphan (PROMETHAZINE-DM) 6.25-15 MG/5ML syrup Take 5 mLs by mouth 4  (four) times daily as needed for cough. 118 mL Andrew Granados K, PA-C   predniSONE (DELTASONE) 20 MG tablet Take 1 tablet (20 mg total) by mouth daily for 3 days. 3 tablet Andrew Burgess, Derry Skill, PA-C      PDMP not reviewed this encounter.   Andrew Croak, PA-C 11/19/21 1210

## 2021-11-20 ENCOUNTER — Telehealth: Payer: Self-pay

## 2021-11-20 NOTE — Telephone Encounter (Signed)
I spoke with pt; pt said he was seen at [facility] UC on 11/19/21 and was dx with viral bronchitis; pt said he is feeling a lot better this morning; pt taking prednisone and cough med.  Neg covid test.Pt scheduled appt with Dr Glori Bickers on 11/25/21 with UC & ED precautions pt voiced understanding. Sending note to Dr Glori Bickers.

## 2021-11-20 NOTE — Telephone Encounter (Signed)
[facility] - Client Nonclinical Telephone Record  [facility] Client [place] [facility] - Client Client Site [place] Provider Loura Pardon - MD Contact Type Call Who Is Calling Patient / Member / Family / Caregiver Caller Name Torryn Hudspeth Phone Number [phone] Patient Name Andrew Burgess Patient DOB [birth year] Call Type Message Only Information Provided Reason for Call Request to Schedule Office Appointment Initial Comment Caller States he went the urgent care today. Caller States he was told to schedule an appointment with his primary care as soon as possible. Caller States he wanted to know if he could be seen this week. Patient request to speak to RN No Additional Comment Decline triage. Office hours provided. Disp. Time Disposition Final User 11/19/2021 5:50:01 PM General Information Provided Yes Tindall, [place]ly Call Closed By: Maryelizabeth Kaufmann Transaction Date/Time: 11/19/2021 5:46:27 PM (ET

## 2021-11-25 ENCOUNTER — Ambulatory Visit: Payer: 59 | Admitting: Family Medicine

## 2021-11-25 ENCOUNTER — Encounter: Payer: Self-pay | Admitting: Family Medicine

## 2021-11-25 VITALS — BP 98/70 | HR 59 | Temp 97.6°F | Resp 16 | Ht 72.0 in | Wt 225.4 lb

## 2021-11-25 DIAGNOSIS — N1831 Chronic kidney disease, stage 3a: Secondary | ICD-10-CM | POA: Diagnosis not present

## 2021-11-25 DIAGNOSIS — J209 Acute bronchitis, unspecified: Secondary | ICD-10-CM

## 2021-11-25 DIAGNOSIS — E1121 Type 2 diabetes mellitus with diabetic nephropathy: Secondary | ICD-10-CM

## 2021-11-25 LAB — BASIC METABOLIC PANEL
BUN: 24 mg/dL — ABNORMAL HIGH (ref 6–23)
CO2: 23 mEq/L (ref 19–32)
Calcium: 9.4 mg/dL (ref 8.4–10.5)
Chloride: 103 mEq/L (ref 96–112)
Creatinine, Ser: 1.5 mg/dL (ref 0.40–1.50)
GFR: 53.08 mL/min — ABNORMAL LOW (ref >60.00)
Glucose, Bld: 113 mg/dL — ABNORMAL HIGH (ref 70–99)
Potassium: 3.9 mEq/L (ref 3.5–5.1)
Sodium: 134 mEq/L — ABNORMAL LOW (ref 135–145)

## 2021-11-25 LAB — HEMOGLOBIN A1C: Hgb A1c MFr Bld: 6.6 % — ABNORMAL HIGH (ref 4.6–6.5)

## 2021-11-25 NOTE — Progress Notes (Signed)
Subjective:    Patient ID: Andrew Burgess, male    DOB: [birth year], 51 y.o.   MRN: [id number]  HPI Pt presents for f/u of viral bronchitis from UC visit   Wt Readings from Last 3 Encounters:  11/25/21 225 lb 6 oz (102.2 kg)  11/17/21 227 lb (103 kg)  10/27/21 227 lb (103 kg)   30.57 kg/m   Seen 6/7 Albuterol mdi, prometh DM and flonase, prednisone 20 mg for 3 d  Cxr was not warranted  Had some eye discharge with it / used warm towels  Used allergy eye drops  Nasal congestion is improved    Feeling better  Still uses inhaler    Cough is much better  No wheezing or sob     Lab Results  Component Value Date   HGBA1C 5.9 02/10/2021   Blood sugars have been very good  Aware the prednisone made his sugar go up   Needs to see the kidney doctor and eye doctor  Not sure who his kidney doctor is  Eye care - Dr Katy Fitch     Walking for exercise  Drinking a gallon of water daily  Lab Results  Component Value Date   CREATININE 1.61 (H) 02/10/2021   BUN 23 02/10/2021   NA 141 02/10/2021   K 3.9 02/10/2021   CL 110 02/10/2021   CO2 22 02/10/2021   Lab Results  Component Value Date   CHOL 137 02/10/2021   HDL 31.50 (L) 02/10/2021   LDLCALC 20 07/26/2017   LDLDIRECT 58.0 02/10/2021   TRIG 204.0 (H) 02/10/2021   CHOLHDL 4 02/10/2021   Fenofibrate 160 mg daily   Patient Active Problem List   Diagnosis Date Noted   Acute bronchitis 11/25/2021   Colon cancer screening 03/06/2019   Prostate cancer screening 10/23/2018   Chronic kidney disease, stage 3a ([place]) 05/02/2018   Family history of coronary artery disease 02/17/2018   Obesity (BMI 30-39.9) 10/25/2017   Sickle cell trait ([place]) 07/26/2017   Routine general medical examination at a health care facility 11/08/2016   Low testosterone in male 06/02/2016   Low libido 05/22/2016   CAD S/P percutaneous coronary angioplasty    Hyperlipidemia associated with type 2 diabetes mellitus ([place])    Type 2 diabetes  with nephropathy ([place])    Status post coronary artery stent placement    Acute MI inferior lateral first episode care ([place]) 01/18/2016   Screen for STD (sexually transmitted disease) 01/12/2011   ALLERGIC RHINITIS 11/11/2007   Hypertriglyceridemia 03/04/2007   Past Medical History:  Diagnosis Date   Allergy    CAD (coronary artery disease)    a. 01/2016: Peak troponin 23.15. Left heart cath on 01/21/16 showed 100% occluded mid-dist RCA occlusion with heavy thrombus burden s/p DES. Rx asp thrombectomy and post procedure Aggrastat. Re look 01/21/16 revealed persistent thrombus in prox aspect of stent as well as a distal lesion both of which were intervened on successfully with placement of 2 additional DES.   Diabetes mellitus ([place])    Exposure to COVID-19 virus 06/05/2019   Hyperglycemia    Hyperlipidemia    triglyceriedes    Hypertriglyceridemia    Myocardial infarction [facility]) 2017   Obesity    Sickle cell trait [facility])    Past Surgical History:  Procedure Laterality Date   ANKLE SURGERY     CARDIAC CATHETERIZATION N/A 01/18/2016   Procedure: Left Heart Cath and Coronary Angiography;  Surgeon: Jettie Booze, MD;  Location: [place]  CV LAB;  Service: Cardiovascular;  Laterality: N/A;   CARDIAC CATHETERIZATION N/A 01/18/2016   Procedure: Coronary Stent Intervention;  Surgeon: Jettie Booze, MD;  Location: [place] CV LAB;  Service: Cardiovascular;  Laterality: N/A;  MID RCA   CARDIAC CATHETERIZATION N/A 01/21/2016   Procedure: Left Heart Cath and Coronary Angiography;  Surgeon: Troy Sine, MD;  Location: [place] CV LAB;  Service: Cardiovascular;  Laterality: N/A;   CARDIAC CATHETERIZATION N/A 01/21/2016   Procedure: Coronary Stent Intervention;  Surgeon: Troy Sine, MD;  Location: [place] CV LAB;  Service: Cardiovascular;  Laterality: N/A;   Social History   Tobacco Use   Smoking status: Former    Types: Cigars    Quit date: 01/18/2012    Years since quitting:  9.8   Smokeless tobacco: Never  Vaping Use   Vaping Use: Never used  Substance Use Topics   Alcohol use: Yes    Alcohol/week: 0.0 standard drinks of alcohol    Comment: rare   Drug use: No   Family History  Problem Relation Age of Onset   Coronary artery disease Mother    Heart attack Mother    Hyperlipidemia Mother    Alcohol abuse Father    Hyperlipidemia Father    Hypertension Father    Hypertension Brother    Cancer Maternal Grandmother        brain   Colon polyps Neg Hx    Colon cancer Neg Hx    Esophageal cancer Neg Hx    Stomach cancer Neg Hx    Rectal cancer Neg Hx    No Known Allergies Current Outpatient Medications on File Prior to Visit  Medication Sig Dispense Refill   aspirin EC 81 MG tablet Take 81 mg by mouth daily.     atorvastatin (LIPITOR) 80 MG tablet Take 1 tablet (80 mg total) by mouth daily. Schedule an appointment for further refills 15 tablet 0   blood glucose meter kit and supplies Dispense based on patient and insurance preference. Use up to four times daily as directed. (FOR ICD-10 E10.9, E11.9). 1 each 0   fenofibrate 160 MG tablet TAKE 1 TABLET BY MOUTH EVERY DAY 90 tablet 1   glucose blood (ONETOUCH ULTRA) test strip USE AS INSTRUCTED TO CHECK BLOOD SUGAR ONCE DAILY. 100 strip 1   JARDIANCE 10 MG TABS tablet TAKE 1 TABLET BY MOUTH EVERY DAY BEFORE BREAKFAST 90 tablet 1   metoprolol tartrate (LOPRESSOR) 25 MG tablet TAKE 1 TABLET 2 TIMES DAILY. PATIENT MUST SCHEDULE APPOINTMENT FOR FUTURE REFILLS. FIRST ATTEMPT 60 tablet 0   nitroGLYCERIN (NITROSTAT) 0.4 MG SL tablet PLACE 1 TABLET UNDER THE TONGUE EVERY 5 MINUTES AS NEEDED FOR CHEST PAIN (3 DOSES MAX). 25 tablet 2   omega-3 acid ethyl esters (LOVAZA) 1 g capsule TAKE 1 CAPSULE BY MOUTH TWICE A DAY 180 capsule 0   promethazine-dextromethorphan (PROMETHAZINE-DM) 6.25-15 MG/5ML syrup Take 5 mLs by mouth 4 (four) times daily as needed for cough. (Patient not taking: Reported on 11/25/2021) 118 mL 0    No current facility-administered medications on file prior to visit.    Review of Systems  Constitutional:  Negative for activity change, appetite change, fatigue, fever and unexpected weight change.  HENT:  Positive for postnasal drip and rhinorrhea. Negative for sore throat and trouble swallowing.   Eyes:  Negative for pain, redness, itching and visual disturbance.  Respiratory:  Positive for cough. Negative for chest tightness, shortness of breath and wheezing.  Cough is much improved   Cardiovascular:  Negative for chest pain and palpitations.  Gastrointestinal:  Negative for abdominal pain, blood in stool, constipation, diarrhea and nausea.  Endocrine: Negative for cold intolerance, heat intolerance, polydipsia and polyuria.  Genitourinary:  Negative for difficulty urinating, dysuria, frequency and urgency.  Musculoskeletal:  Negative for arthralgias, joint swelling and myalgias.  Skin:  Negative for pallor and rash.  Neurological:  Negative for dizziness, tremors, weakness, numbness and headaches.  Hematological:  Negative for adenopathy. Does not bruise/bleed easily.  Psychiatric/Behavioral:  Negative for decreased concentration and dysphoric mood. The patient is not nervous/anxious.        Objective:   Physical Exam Constitutional:      General: He is not in acute distress.    Appearance: Normal appearance. He is well-developed. He is obese. He is not ill-appearing or diaphoretic.  HENT:     Head: Normocephalic and atraumatic.  Eyes:     Conjunctiva/sclera: Conjunctivae normal.     Pupils: Pupils are equal, round, and reactive to light.  Neck:     Thyroid: No thyromegaly.     Vascular: No carotid bruit or JVD.  Cardiovascular:     Rate and Rhythm: Regular rhythm. Bradycardia present.     Heart sounds: Normal heart sounds.     No gallop.  Pulmonary:     Effort: Pulmonary effort is normal. No respiratory distress.     Breath sounds: Normal breath sounds. No  stridor. No wheezing, rhonchi or rales.     Comments: No wheeze even on forced exp Chest:     Chest wall: No tenderness.  Abdominal:     General: There is no distension or abdominal bruit.     Palpations: Abdomen is soft.  Musculoskeletal:     Cervical back: Normal range of motion and neck supple.     Right lower leg: No edema.     Left lower leg: No edema.  Lymphadenopathy:     Cervical: No cervical adenopathy.  Skin:    General: Skin is warm and dry.     Coloration: Skin is not pale.     Findings: No rash.  Neurological:     Mental Status: He is alert.     Coordination: Coordination normal.     Deep Tendon Reflexes: Reflexes are normal and symmetric. Reflexes normal.  Psychiatric:        Mood and Affect: Mood normal.           Assessment & Plan:   Problem List Items Addressed This Visit       Respiratory   Acute bronchitis - Primary    Much improved  Rev UC note 6/7 Albuterol and predisone helped   Disc nasal cold symptoms/opt for tx with saline and steroid ns inst to call if symptoms return or worsen        Endocrine   Type 2 diabetes with nephropathy (HCC)    A1C ordered Good habits  Due for eye exam/ref done   Taking jardiance with good reported control  On a statin also        Relevant Orders   Ambulatory referral to Ophthalmology   Hemoglobin A1c (Completed)     Genitourinary   Chronic kidney disease, stage 3a ([place])    Due for renal visit  (he will let us know if he needs a referral) bmet today  Drinking fluids Avoids nsaids        Relevant Orders   Basic metabolic panel (Completed)

## 2021-11-25 NOTE — Patient Instructions (Addendum)
Afrin and 4 way and other similar fast acting nasal sprays are addictive Never use more than 2 days   Saline nasal spray is fine  Flonase/nasacort (steroid nasal sprays are ok to use daily)  Hold the albuterol unless you need it  If cough or other symptoms return let us know    Try to get most of your carbohydrates from produce (with the exception of white potatoes)  Eat less bread/pasta/rice/snack foods/cereals/sweets and other items from the middle of the grocery store (processed carbs)  Eat fresh fruit instead of fruit cocktail  Eat small portions   Get exercise also   Lab today  Look up the name of your last nephrologist  I placed a referral for eye exam - if you don't get a call in 2 weeks let us know

## 2021-11-25 NOTE — Assessment & Plan Note (Signed)
Due for renal visit  (he will let us know if he needs a referral) bmet today  Drinking fluids Avoids nsaids

## 2021-11-25 NOTE — Assessment & Plan Note (Signed)
A1C ordered Good habits  Due for eye exam/ref done   Taking jardiance with good reported control  On a statin also

## 2021-11-25 NOTE — Assessment & Plan Note (Signed)
Much improved  Rev UC note 6/7 Albuterol and predisone helped   Disc nasal cold symptoms/opt for tx with saline and steroid ns inst to call if symptoms return or worsen

## 2021-12-15 ENCOUNTER — Other Ambulatory Visit: Payer: Self-pay | Admitting: Cardiology

## 2021-12-16 ENCOUNTER — Other Ambulatory Visit: Payer: Self-pay | Admitting: Family Medicine

## 2022-01-06 LAB — HM DIABETES EYE EXAM

## 2022-01-10 ENCOUNTER — Other Ambulatory Visit: Payer: Self-pay | Admitting: Family Medicine

## 2022-01-12 ENCOUNTER — Other Ambulatory Visit: Payer: Self-pay | Admitting: Cardiology

## 2022-01-28 ENCOUNTER — Other Ambulatory Visit: Payer: Self-pay | Admitting: Family Medicine

## 2022-01-28 DIAGNOSIS — E11319 Type 2 diabetes mellitus with unspecified diabetic retinopathy without macular edema: Secondary | ICD-10-CM

## 2022-02-03 ENCOUNTER — Encounter: Payer: Self-pay | Admitting: Family Medicine

## 2022-02-03 ENCOUNTER — Ambulatory Visit (INDEPENDENT_AMBULATORY_CARE_PROVIDER_SITE_OTHER): Payer: 59 | Admitting: Family Medicine

## 2022-02-03 VITALS — BP 126/80 | HR 67 | Ht 71.0 in | Wt 226.0 lb

## 2022-02-03 DIAGNOSIS — Z125 Encounter for screening for malignant neoplasm of prostate: Secondary | ICD-10-CM | POA: Diagnosis not present

## 2022-02-03 DIAGNOSIS — Z1211 Encounter for screening for malignant neoplasm of colon: Secondary | ICD-10-CM

## 2022-02-03 DIAGNOSIS — E1169 Type 2 diabetes mellitus with other specified complication: Secondary | ICD-10-CM

## 2022-02-03 DIAGNOSIS — N1831 Chronic kidney disease, stage 3a: Secondary | ICD-10-CM | POA: Diagnosis not present

## 2022-02-03 DIAGNOSIS — E785 Hyperlipidemia, unspecified: Secondary | ICD-10-CM | POA: Diagnosis not present

## 2022-02-03 DIAGNOSIS — E1121 Type 2 diabetes mellitus with diabetic nephropathy: Secondary | ICD-10-CM

## 2022-02-03 DIAGNOSIS — E669 Obesity, unspecified: Secondary | ICD-10-CM

## 2022-02-03 DIAGNOSIS — D573 Sickle-cell trait: Secondary | ICD-10-CM

## 2022-02-03 DIAGNOSIS — Z0000 Encounter for general adult medical examination without abnormal findings: Secondary | ICD-10-CM | POA: Diagnosis not present

## 2022-02-03 LAB — COMPREHENSIVE METABOLIC PANEL
ALT: 32 U/L (ref 0–53)
AST: 26 U/L (ref 0–37)
Albumin: 4.8 g/dL (ref 3.5–5.2)
Alkaline Phosphatase: 53 U/L (ref 39–117)
BUN: 21 mg/dL (ref 6–23)
CO2: 24 mEq/L (ref 19–32)
Calcium: 9.8 mg/dL (ref 8.4–10.5)
Chloride: 104 mEq/L (ref 96–112)
Creatinine, Ser: 1.44 mg/dL (ref 0.40–1.50)
GFR: 55.67 mL/min — ABNORMAL LOW (ref >60.00)
Glucose, Bld: 118 mg/dL — ABNORMAL HIGH (ref 70–99)
Potassium: 4.4 mEq/L (ref 3.5–5.1)
Sodium: 141 mEq/L (ref 135–145)
Total Bilirubin: 0.5 mg/dL (ref 0.2–1.2)
Total Protein: 7.8 g/dL (ref 6.0–8.3)

## 2022-02-03 LAB — CBC WITH DIFFERENTIAL/PLATELET
Basophils Absolute: 0 10*3/uL (ref 0.0–0.1)
Basophils Relative: 0.6 % (ref 0.0–3.0)
Eosinophils Absolute: 0.1 10*3/uL (ref 0.0–0.7)
Eosinophils Relative: 2.1 % (ref 0.0–5.0)
HCT: 42.6 % (ref 39.0–52.0)
Hemoglobin: 13.8 g/dL (ref 13.0–17.0)
Lymphocytes Relative: 32.2 % (ref 12.0–46.0)
Lymphs Abs: 2 10*3/uL (ref 0.7–4.0)
MCHC: 32.4 g/dL (ref 30.0–36.0)
MCV: 88.1 fl (ref 78.0–100.0)
Monocytes Absolute: 0.5 10*3/uL (ref 0.1–1.0)
Monocytes Relative: 7.3 % (ref 3.0–12.0)
Neutro Abs: 3.7 10*3/uL (ref 1.4–7.7)
Neutrophils Relative %: 57.8 % (ref 43.0–77.0)
Platelets: 280 10*3/uL (ref 150.0–400.0)
RBC: 4.83 Mil/uL (ref 4.22–5.81)
RDW: 14.4 % (ref 11.5–15.5)
WBC: 6.4 10*3/uL (ref 4.0–10.5)

## 2022-02-03 LAB — PSA: PSA: 0.08 ng/mL — ABNORMAL LOW (ref 0.10–4.00)

## 2022-02-03 LAB — LIPID PANEL
Cholesterol: 130 mg/dL (ref 0–200)
HDL: 30.1 mg/dL — ABNORMAL LOW (ref >39.00)
NonHDL: 99.58
Total CHOL/HDL Ratio: 4
Triglycerides: 243 mg/dL — ABNORMAL HIGH (ref 0.0–149.0)
VLDL: 48.6 mg/dL — ABNORMAL HIGH (ref 0.0–40.0)

## 2022-02-03 LAB — LDL CHOLESTEROL, DIRECT: Direct LDL: 54 mg/dL

## 2022-02-03 LAB — TSH: TSH: 2.14 u[IU]/mL (ref 0.35–5.50)

## 2022-02-03 MED ORDER — FENOFIBRATE 160 MG PO TABS
160.0000 mg | ORAL_TABLET | Freq: Every day | ORAL | 3 refills | Status: DC
Start: 2022-02-03 — End: 2023-03-10

## 2022-02-03 NOTE — Assessment & Plan Note (Signed)
Colonoscopy utd 11/2021

## 2022-02-03 NOTE — Assessment & Plan Note (Signed)
psa ordered No family hx No voiding changes  Nocturia times one baseline

## 2022-02-03 NOTE — Assessment & Plan Note (Signed)
Reviewed health habits including diet and exercise and skin cancer prevention Reviewed appropriate screening tests for age  Also reviewed health mt list, fam hx and immunization status , as well as social and family history   See HPI Labs ordered Plans to check on affordability of shingrix Plans flu sho tin the fall  Colonoscopy utd 11/2021 Eye exam utd psa ordered

## 2022-02-03 NOTE — Assessment & Plan Note (Signed)
Discussed how this problem influences overall health and the risks it imposes  Reviewed plan for weight loss with lower calorie diet (via better food choices and also portion control or program like weight watchers) and exercise building up to or more than 30 minutes 5 days per week including some aerobic activity    

## 2022-02-03 NOTE — Assessment & Plan Note (Signed)
No symptoms or clinical changes

## 2022-02-03 NOTE — Assessment & Plan Note (Signed)
Lab Results  Component Value Date   HGBA1C 6.6 (H) 11/25/2021   Good control  Plan to continue jardiance 10 mg daily  Sees nephrology-due for visit  Eye exam utd-sent for note  On statin medication

## 2022-02-03 NOTE — Patient Instructions (Addendum)
If you are interested in the shingles vaccine series (Shingrix), call your insurance or pharmacy to check on coverage and location it must be given.  If affordable - you can schedule it here or at your pharmacy depending on coverage   When you check out- we will send for your eye exam note   Your a1c was good at 6.6 on 11/25/21  Keep up the good work  That is not due until after 02/25/22   Labs today    Call [facility] and schedule your yearly kidney follow up   Follow up in 6 months   Keep eating a low glycemic diet and stay active   Watch the lump on your left leg If it does not go away or decrease in size in a month please let me know

## 2022-02-03 NOTE — Assessment & Plan Note (Signed)
Due for yearly f/u with nephrology Pt will call to schedule

## 2022-02-03 NOTE — Progress Notes (Signed)
Subjective:    Patient ID: Andrew Burgess, male    DOB: 1971-05-13, 51 y.o.   MRN: [id number]  HPI Here for health maintenance exam and to review chronic medical problems    Wt Readings from Last 3 Encounters:  02/03/22 226 lb (102.5 kg)  11/25/21 225 lb 6 oz (102.2 kg)  11/17/21 227 lb (103 kg)   31.52 kg/m Keeping weight down  Walk/rides bike   Eating better- lot of salads     Immunization History  Administered Date(s) Administered   Influenza Split 05/23/2012, 04/05/2014   Influenza, Quadrivalent, Recombinant, Inj, Pf 04/10/2017   Influenza,inj,Quad PF,6+ Mos 03/10/2016, 05/02/2018, 03/06/2019, 03/20/2020, 02/10/2021   PFIZER(Purple Top)SARS-COV-2 Vaccination 10/21/2019, 11/07/2019, 06/10/2020   Pneumococcal Polysaccharide-23 07/26/2017   Td 09/15/2004   Tdap 11/23/2016   Health Maintenance Due  Topic Date Due   Zoster Vaccines- Shingrix (1 of 2) Never done   COVID-19 Vaccine (4 - Pfizer series) 08/05/2020   URINE MICROALBUMIN  03/20/2021   OPHTHALMOLOGY EXAM  09/02/2021   INFLUENZA VACCINE  01/13/2022   Shingrix: interested in if covered   Flu shot -will get in the fall    Colon cancer screening  -colonoscopy 11/2021  Eye exam:  just had one - Dr Katy Fitch   Prostate health Lab Results  Component Value Date   PSA 0.09 (L) 02/27/2019  No urinary changes  Drinks over a gallon of water per day so urinates more anyway  Nocturia once   No change in steam Not slow   CAD-doing well   BP Readings from Last 3 Encounters:  02/03/22 126/80  11/25/21 98/70  11/19/21 135/80   Pulse Readings from Last 3 Encounters:  02/03/22 67  11/25/21 (!) 59  11/19/21 90   Metoprolol 25 mg daily  Asa Nitrogly prn    DM2 Lab Results  Component Value Date   HGBA1C 6.6 (H) 11/25/2021   Jardiance 10 mg daily  Too early to do it again today  No hypoglycemia = 90s lowest  204 highest directly after eating    Eye exam  Lab Results  Component Value Date    MICROALBUR 3.2 (H) 03/20/2020   MICROALBUR 1.4 05/02/2018   Hyperlipidemia Lab Results  Component Value Date   CHOL 137 02/10/2021   HDL 31.50 (L) 02/10/2021   LDLCALC 20 07/26/2017   LDLDIRECT 58.0 02/10/2021   TRIG 204.0 (H) 02/10/2021   CHOLHDL 4 02/10/2021   Due for labs Fenofibrate 160 mg daily  Lovaza 1.6 mg daily  Atorvastatin 80 mg daily   Tolerates well   Diet is fairly good  Fried fish -once in a while    CKD from DM Lab Results  Component Value Date   CREATININE 1.50 11/25/2021   BUN 24 (H) 11/25/2021   NA 134 (L) 11/25/2021   K 3.9 11/25/2021   CL 103 11/25/2021   CO2 23 11/25/2021   Sees nephrology- due for visit next month   Patient Active Problem List   Diagnosis Date Noted   Colon cancer screening 03/06/2019   Prostate cancer screening 10/23/2018   Chronic kidney disease, stage 3a ([place]) 05/02/2018   Family history of coronary artery disease 02/17/2018   Obesity (BMI 30-39.9) 10/25/2017   Sickle cell trait ([place]) 07/26/2017   Routine general medical examination at a health care facility 11/08/2016   Low testosterone in male 06/02/2016   Low libido 05/22/2016   CAD S/P percutaneous coronary angioplasty    Hyperlipidemia associated with type 2 diabetes  mellitus ([place])    Type 2 diabetes with nephropathy ([place])    Status post coronary artery stent placement    Acute MI inferior lateral first episode care [facility]) 01/18/2016   Screen for STD (sexually transmitted disease) 01/12/2011   ALLERGIC RHINITIS 11/11/2007   Hypertriglyceridemia 03/04/2007   Past Medical History:  Diagnosis Date   Allergy    CAD (coronary artery disease)    a. 01/2016: Peak troponin 23.15. Left heart cath on 01/21/16 showed 100% occluded mid-dist RCA occlusion with heavy thrombus burden s/p DES. Rx asp thrombectomy and post procedure Aggrastat. Re look 01/21/16 revealed persistent thrombus in prox aspect of stent as well as a distal lesion both of which were intervened on  successfully with placement of 2 additional DES.   Diabetes mellitus ([place])    Exposure to COVID-19 virus 06/05/2019   Hyperglycemia    Hyperlipidemia    triglyceriedes    Hypertriglyceridemia    Myocardial infarction [facility]) 2017   Obesity    Sickle cell trait [facility])    Past Surgical History:  Procedure Laterality Date   ANKLE SURGERY     CARDIAC CATHETERIZATION N/A 01/18/2016   Procedure: Left Heart Cath and Coronary Angiography;  Surgeon: Jettie Booze, MD;  Location: [place] [facility];  Service: Cardiovascular;  Laterality: N/A;   CARDIAC CATHETERIZATION N/A 01/18/2016   Procedure: Coronary Stent Intervention;  Surgeon: Jettie Booze, MD;  Location: [place] [facility];  Service: Cardiovascular;  Laterality: N/A;  MID RCA   CARDIAC CATHETERIZATION N/A 01/21/2016   Procedure: Left Heart Cath and Coronary Angiography;  Surgeon: Troy Sine, MD;  Location: [place] [facility];  Service: Cardiovascular;  Laterality: N/A;   CARDIAC CATHETERIZATION N/A 01/21/2016   Procedure: Coronary Stent Intervention;  Surgeon: Troy Sine, MD;  Location: [place] [facility];  Service: Cardiovascular;  Laterality: N/A;   Social History   Tobacco Use   Smoking status: Former    Types: Cigars    Quit date: 01/18/2012    Years since quitting: 10.0   Smokeless tobacco: Never  Vaping Use   Vaping Use: Never used  Substance Use Topics   Alcohol use: Yes    Alcohol/week: 0.0 standard drinks of alcohol    Comment: rare   Drug use: No   Family History  Problem Relation Age of Onset   Coronary artery disease Mother    Heart attack Mother    Hyperlipidemia Mother    Alcohol abuse Father    Hyperlipidemia Father    Hypertension Father    Hypertension Brother    Cancer Maternal Grandmother        brain   Colon polyps Neg Hx    Colon cancer Neg Hx    Esophageal cancer Neg Hx    Stomach cancer Neg Hx    Rectal cancer Neg Hx    No Known Allergies Current Outpatient Medications on File  Prior to Visit  Medication Sig Dispense Refill   aspirin EC 81 MG tablet Take 81 mg by mouth daily.     atorvastatin (LIPITOR) 80 MG tablet Take 1 tablet (80 mg total) by mouth daily. Schedule an appointment for further refills 15 tablet 0   blood glucose meter kit and supplies Dispense based on patient and insurance preference. Use up to four times daily as directed. (FOR ICD-10 E10.9, E11.9). 1 each 0   glucose blood (ONETOUCH ULTRA) test strip USE AS INSTRUCTED TO CHECK BLOOD SUGAR TWICE DAILY 100 strip 1  JARDIANCE 10 MG TABS tablet TAKE 1 TABLET BY MOUTH EVERY DAY BEFORE BREAKFAST 90 tablet 1   metoprolol tartrate (LOPRESSOR) 25 MG tablet TAKE 1 TABLET 2 TIMES DAILY. PATIENT MUST SCHEDULE APPOINTMENT FOR FUTURE REFILLS. FIRST ATTEMPT 60 tablet 0   nitroGLYCERIN (NITROSTAT) 0.4 MG SL tablet PLACE 1 TABLET UNDER THE TONGUE EVERY 5 MINUTES AS NEEDED FOR CHEST PAIN (3 DOSES MAX). 25 tablet 2   omega-3 acid ethyl esters (LOVAZA) 1 g capsule TAKE 1 CAPSULE BY MOUTH TWICE A DAY 180 capsule 0   No current facility-administered medications on file prior to visit.    Review of Systems  Constitutional:  Negative for activity change, appetite change, fatigue, fever and unexpected weight change.  HENT:  Negative for congestion, rhinorrhea, sore throat and trouble swallowing.   Eyes:  Negative for pain, redness, itching and visual disturbance.  Respiratory:  Negative for cough, chest tightness, shortness of breath and wheezing.   Cardiovascular:  Negative for chest pain and palpitations.  Gastrointestinal:  Negative for abdominal pain, blood in stool, constipation, diarrhea and nausea.  Endocrine: Negative for cold intolerance, heat intolerance, polydipsia and polyuria.  Genitourinary:  Negative for difficulty urinating, dysuria, frequency and urgency.  Musculoskeletal:  Negative for arthralgias, joint swelling and myalgias.  Skin:  Negative for pallor and rash.       Small lump on l lower leg    Neurological:  Negative for dizziness, tremors, weakness, numbness and headaches.  Hematological:  Negative for adenopathy. Does not bruise/bleed easily.  Psychiatric/Behavioral:  Negative for decreased concentration and dysphoric mood. The patient is not nervous/anxious.        Objective:   Physical Exam Constitutional:      General: He is not in acute distress.    Appearance: Normal appearance. He is well-developed. He is obese. He is not ill-appearing or diaphoretic.  HENT:     Head: Normocephalic and atraumatic.     Right Ear: Tympanic membrane, ear canal and external ear normal.     Left Ear: Tympanic membrane, ear canal and external ear normal.     Nose: Nose normal. No congestion.     Mouth/Throat:     Mouth: Mucous membranes are moist.     Pharynx: Oropharynx is clear. No posterior oropharyngeal erythema.  Eyes:     General: No scleral icterus.       Right eye: No discharge.        Left eye: No discharge.     Conjunctiva/sclera: Conjunctivae normal.     Pupils: Pupils are equal, round, and reactive to light.  Neck:     Thyroid: No thyromegaly.     Vascular: No carotid bruit or JVD.  Cardiovascular:     Rate and Rhythm: Normal rate and regular rhythm.     Pulses: Normal pulses.     Heart sounds: Normal heart sounds.     No gallop.  Pulmonary:     Effort: Pulmonary effort is normal. No respiratory distress.     Breath sounds: Normal breath sounds. No wheezing or rales.     Comments: Good air exch Chest:     Chest wall: No tenderness.  Abdominal:     General: Bowel sounds are normal. There is no distension or abdominal bruit.     Palpations: Abdomen is soft. There is no mass.     Tenderness: There is no abdominal tenderness.     Hernia: No hernia is present.  Musculoskeletal:        General:  No tenderness.     Cervical back: Normal range of motion and neck supple. No rigidity. No muscular tenderness.     Right lower leg: No edema.     Left lower leg: No edema.      Comments: Small mobile lump under skin of LLE Not tender  Bb size   Lymphadenopathy:     Cervical: No cervical adenopathy.  Skin:    General: Skin is warm and dry.     Coloration: Skin is not pale.     Findings: No erythema or rash.     Comments: Scattered tags and lentigines    Small lump on L lower leg is near a healing scab  Neurological:     Mental Status: He is alert.     Cranial Nerves: No cranial nerve deficit.     Motor: No abnormal muscle tone.     Coordination: Coordination normal.     Gait: Gait normal.     Deep Tendon Reflexes: Reflexes are normal and symmetric. Reflexes normal.  Psychiatric:        Mood and Affect: Mood normal.        Cognition and Memory: Cognition normal.           Assessment & Plan:   Problem List Items Addressed This Visit       Endocrine   Hyperlipidemia associated with type 2 diabetes mellitus ([place]) - Primary    Lab today  Disc goals for lipids and reasons to control them Rev last labs with pt Rev low sat fat diet in detail  Fenofibrate 160 mg daily  Atorvastatin 80 mg daily  lovaza 1.6 mg daily  Tolerates well  Diet is fair       Relevant Medications   fenofibrate 160 MG tablet   Other Relevant Orders   Lipid panel (Completed)   Comprehensive metabolic panel (Completed)   Type 2 diabetes with nephropathy (HCC)    Lab Results  Component Value Date   HGBA1C 6.6 (H) 11/25/2021  Good control  Plan to continue jardiance 10 mg daily  Sees nephrology-due for visit  Eye exam utd-sent for note  On statin medication         Genitourinary   Chronic kidney disease, stage 3a ([place])    Due for yearly f/u with nephrology Pt will call to schedule       Relevant Orders   Comprehensive metabolic panel (Completed)     Other   Colon cancer screening    Colonoscopy utd 11/2021       Obesity (BMI 30-39.9)    Discussed how this problem influences overall health and the risks it imposes  Reviewed plan for weight loss  with lower calorie diet (via better food choices and also portion control or program like weight watchers) and exercise building up to or more than 30 minutes 5 days per week including some aerobic activity         Prostate cancer screening    psa ordered No family hx No voiding changes  Nocturia times one baseline       Relevant Orders   PSA (Completed)   Routine general medical examination at a health care facility    Reviewed health habits including diet and exercise and skin cancer prevention Reviewed appropriate screening tests for age  Also reviewed health mt list, fam hx and immunization status , as well as social and family history   See HPI Labs ordered Plans to check on affordability of shingrix  Plans flu sho tin the fall  Colonoscopy utd 11/2021 Eye exam utd psa ordered        Relevant Orders   TSH (Completed)   Lipid panel (Completed)   CBC with Differential/Platelet (Completed)   Sickle cell trait ([place])    No symptoms or clinical changes       Relevant Orders   CBC with Differential/Platelet (Completed)

## 2022-02-03 NOTE — Assessment & Plan Note (Signed)
Lab today  Disc goals for lipids and reasons to control them Rev last labs with pt Rev low sat fat diet in detail  Fenofibrate 160 mg daily  Atorvastatin 80 mg daily  lovaza 1.6 mg daily  Tolerates well  Diet is fair

## 2022-02-08 ENCOUNTER — Other Ambulatory Visit: Payer: Self-pay | Admitting: Cardiology

## 2022-02-26 ENCOUNTER — Encounter: Payer: Self-pay | Admitting: Family Medicine

## 2022-03-10 ENCOUNTER — Other Ambulatory Visit: Payer: Self-pay | Admitting: Cardiology

## 2022-03-11 ENCOUNTER — Telehealth: Payer: Self-pay | Admitting: Family Medicine

## 2022-03-11 MED ORDER — ATORVASTATIN CALCIUM 80 MG PO TABS: 80.0000 mg | ORAL_TABLET | Freq: Every day | ORAL | 2 refills | Status: DC

## 2022-03-11 NOTE — Telephone Encounter (Signed)
Refill sent to pharmacy per patient's request.

## 2022-03-11 NOTE — Telephone Encounter (Signed)
Caller Name: Daanish Copes  Call back phone #: [phone]  MEDICATION(S):  atorvastatin (LIPITOR) 80 MG tablet  Days of Med Remaining:   Has the patient contacted their pharmacy (YES/NO)? YES What did pharmacy advise?  Contact pcp to approve meds   [facility] [address]   ~~~Please advise patient/caregiver to allow 2-3 business days to process RX refills.

## 2022-04-15 ENCOUNTER — Other Ambulatory Visit: Payer: Self-pay | Admitting: Family Medicine

## 2022-04-22 ENCOUNTER — Telehealth: Payer: Self-pay | Admitting: Cardiology

## 2022-04-22 ENCOUNTER — Other Ambulatory Visit: Payer: Self-pay | Admitting: Cardiology

## 2022-04-22 ENCOUNTER — Telehealth: Payer: Self-pay | Admitting: Family Medicine

## 2022-04-22 NOTE — Telephone Encounter (Signed)
 *  STAT* If patient is at the pharmacy, call can be transferred to refill team.   1. Which medications need to be refilled? (please list name of each medication and dose if known)   metoprolol tartrate (LOPRESSOR) 25 MG tablet    2. Which pharmacy/location (including street and city if local pharmacy) is medication to be sent to?  [id number] [place], [place] - [address]    3. Do they need a 30 day or 90 day supply? 30 days  Pt made an appt with EDiona Browneron Monday 04/27/22 at 3:35 pm. Pt is out of meds, need refill today

## 2022-04-22 NOTE — Telephone Encounter (Signed)
  Encourage patient to contact the pharmacy for refills or they can request refills through [facility]  Did the patient contact the pharmacy: Yes  LAST APPOINTMENT DATE:  02/03/2022  NEXT APPOINTMENT DATE: 08/10/2022  MEDICATION: metoprolol tartrate (LOPRESSOR) 25 MG tablet   Is the patient out of medication? YES  If not, how much is left? Enough for tonight   [facility]  [id number] [place] [facility] [address]    Let patient know to contact pharmacy at the end of the day to make sure medication is ready.  Please notify patient to allow 48-72 hours to process

## 2022-04-22 NOTE — Telephone Encounter (Signed)
He needs to call and schedule f/u with cardiology and have them refill this Thanks

## 2022-04-22 NOTE — Telephone Encounter (Signed)
Pt notified of Dr. Marliss Coots comments he will call cardiology

## 2022-04-22 NOTE — Telephone Encounter (Signed)
Looks like cardiologist refilled last and put a note:   PATIENT MUST SCHEDULE APPOINTMENT FOR FUTURE REFILLS. FIRST ATTEMPT  Please advise

## 2022-04-23 ENCOUNTER — Other Ambulatory Visit: Payer: Self-pay | Admitting: *Deleted

## 2022-04-23 MED ORDER — METOPROLOL TARTRATE 25 MG PO TABS: ORAL_TABLET | ORAL | 0 refills | Status: DC

## 2022-04-23 NOTE — Telephone Encounter (Signed)
Patient called to follow-up on the refill for his prescription.

## 2022-04-27 ENCOUNTER — Ambulatory Visit: Payer: 59 | Attending: Nurse Practitioner | Admitting: Nurse Practitioner

## 2022-04-27 ENCOUNTER — Encounter: Payer: Self-pay | Admitting: Nurse Practitioner

## 2022-04-27 VITALS — BP 116/72 | HR 66 | Ht 72.0 in | Wt 230.0 lb

## 2022-04-27 DIAGNOSIS — I2510 Atherosclerotic heart disease of native coronary artery without angina pectoris: Secondary | ICD-10-CM | POA: Diagnosis not present

## 2022-04-27 DIAGNOSIS — E785 Hyperlipidemia, unspecified: Secondary | ICD-10-CM

## 2022-04-27 DIAGNOSIS — E119 Type 2 diabetes mellitus without complications: Secondary | ICD-10-CM

## 2022-04-27 DIAGNOSIS — I10 Essential (primary) hypertension: Secondary | ICD-10-CM | POA: Diagnosis not present

## 2022-04-27 NOTE — Progress Notes (Signed)
Office Visit    Patient Name: Andrew Burgess Date of Encounter: 04/27/2022  Primary Care Provider:  Abner Greenspan, MD Primary Cardiologist:  Kirk Ruths, MD  Chief Complaint    51 year old male with a history of CAD s/p NSTEMI, DES x3-RCA (with staged thrombectomy) in 2017, hyperlipidemia, and type 2 diabetes who presents for follow-up related to CAD.  Past Medical History    Past Medical History:  Diagnosis Date   Allergy    CAD (coronary artery disease)    a. 01/2016: Peak troponin 23.15. Left heart cath on 01/21/16 showed 100% occluded mid-dist RCA occlusion with heavy thrombus burden s/p DES. Rx asp thrombectomy and post procedure Aggrastat. Re look 01/21/16 revealed persistent thrombus in prox aspect of stent as well as a distal lesion both of which were intervened on successfully with placement of 2 additional DES.   Diabetes mellitus ([place])    Exposure to COVID-19 virus 06/05/2019   Hyperglycemia    Hyperlipidemia    triglyceriedes    Hypertriglyceridemia    Myocardial infarction [facility]) 2017   Obesity    Sickle cell trait [facility])    Past Surgical History:  Procedure Laterality Date   ANKLE SURGERY     CARDIAC CATHETERIZATION N/A 01/18/2016   Procedure: Left Heart Cath and Coronary Angiography;  Surgeon: Jettie Booze, MD;  Location: [place] [facility];  Service: Cardiovascular;  Laterality: N/A;   CARDIAC CATHETERIZATION N/A 01/18/2016   Procedure: Coronary Stent Intervention;  Surgeon: Jettie Booze, MD;  Location: [place] [facility];  Service: Cardiovascular;  Laterality: N/A;  MID RCA   CARDIAC CATHETERIZATION N/A 01/21/2016   Procedure: Left Heart Cath and Coronary Angiography;  Surgeon: Troy Sine, MD;  Location: [place] [facility];  Service: Cardiovascular;  Laterality: N/A;   CARDIAC CATHETERIZATION N/A 01/21/2016   Procedure: Coronary Stent Intervention;  Surgeon: Troy Sine, MD;  Location: [place] [facility];  Service: Cardiovascular;  Laterality:  N/A;    Allergies  No Known Allergies  History of Present Illness    51 year old male with the above past medical history including CAD s/p NSTEMI, DES x3-RCA (with staged thrombectomy) in 2017, hyperlipidemia, and type 2 diabetes.   He was hospitalized in 01/2016 in the setting of NSTEMI.  LHC showed 100% occluded mid to distal RCA with heavy thrombus burden s/p DES.  Relook cath days later revealed persistent thrombus in the proximal aspect of stent as well as distal lesion both of which were intervened on successfully treated with placement of additional DES x2. 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  Echocardiogram at the time showed EF 60 to 65%, normal LV function, no RWMA, mild LVH, normal RV systolic function, no significant valvular abnormalities.  He was last seen in the office on 03/14/2020 and was stable from a cardiac standpoint.  He denied symptoms concerning for angina. He has not been seen in follow-up since.   He presents today for follow-up.  Since his last visit has been stable from a cardiac standpoint.  He denies any symptoms concerning for angina.  He is walking almost 10,000 steps daily.  He continues to work on dietary modifications.  Overall, he reports feeling well.  Home Medications    Current Outpatient Medications  Medication Sig Dispense Refill   aspirin EC 81 MG tablet Take 81 mg by mouth daily.     atorvastatin (LIPITOR) 80 MG tablet Take 1 tablet (80 mg total) by mouth daily. Schedule  an appointment for further refills 90 tablet 2   blood glucose meter kit and supplies Dispense based on patient and insurance preference. Use up to four times daily as directed. (FOR ICD-10 E10.9, E11.9). 1 each 0   fenofibrate 160 MG tablet Take 1 tablet (160 mg total) by mouth daily. 90 tablet 3   glucose blood (ONETOUCH ULTRA) test strip USE AS INSTRUCTED TO CHECK BLOOD SUGAR TWICE DAILY 100 strip 1   JARDIANCE 10 MG TABS tablet TAKE 1 TABLET BY MOUTH EVERY DAY BEFORE  BREAKFAST 90 tablet 1   metoprolol tartrate (LOPRESSOR) 25 MG tablet TAKE 1 TABLET 2 TIMES DAILY. PATIENT MUST SCHEDULE APPOINTMENT FOR FUTURE REFILLS. FIRST ATTEMPT 30 tablet 0   nitroGLYCERIN (NITROSTAT) 0.4 MG SL tablet PLACE 1 TABLET UNDER THE TONGUE EVERY 5 MINUTES AS NEEDED FOR CHEST PAIN (3 DOSES MAX). 25 tablet 2   omega-3 acid ethyl esters (LOVAZA) 1 g capsule TAKE 1 CAPSULE BY MOUTH TWICE A DAY 180 capsule 0   No current facility-administered medications for this visit.     Review of Systems    He denies chest pain, palpitations, dyspnea, pnd, orthopnea, n, v, dizziness, syncope, edema, weight gain, or early satiety. All other systems reviewed and are otherwise negative except as noted above.   Physical Exam    VS:  BP 116/72 (BP Location: Left Arm, Patient Position: Sitting, Cuff Size: Large)   Pulse 66   Ht 6' (1.829 m)   Wt 230 lb (104.3 kg)   SpO2 96%   BMI 31.19 kg/m   GEN: Well nourished, well developed, in no acute distress. HEENT: normal. Neck: Supple, no JVD, carotid bruits, or masses. Cardiac: RRR, no murmurs, rubs, or gallops. No clubbing, cyanosis, edema.  Radials/DP/PT 2+ and equal bilaterally.  Respiratory:  Respirations regular and unlabored, clear to auscultation bilaterally. GI: Soft, nontender, nondistended, BS + x 4. MS: no deformity or atrophy. Skin: warm and dry, no rash. Neuro:  Strength and sensation are intact. Psych: Normal affect.  Accessory Clinical Findings    ECG personally reviewed by me today -NSR, 66 bpm, nonspecific T wave abnormality- no acute changes.   Lab Results  Component Value Date   WBC 6.4 02/03/2022   HGB 13.8 02/03/2022   HCT 42.6 02/03/2022   MCV 88.1 02/03/2022   PLT 280.0 02/03/2022   Lab Results  Component Value Date   CREATININE 1.44 02/03/2022   BUN 21 02/03/2022   NA 141 02/03/2022   K 4.4 02/03/2022   CL 104 02/03/2022   CO2 24 02/03/2022   Lab Results  Component Value Date   ALT 32 02/03/2022   AST  26 02/03/2022   ALKPHOS 53 02/03/2022   BILITOT 0.5 02/03/2022   Lab Results  Component Value Date   CHOL 130 02/03/2022   HDL 30.10 (L) 02/03/2022   LDLCALC 20 07/26/2017   LDLDIRECT 54.0 02/03/2022   TRIG 243.0 (H) 02/03/2022   CHOLHDL 4 02/03/2022    Lab Results  Component Value Date   HGBA1C 6.6 (H) 11/25/2021    Assessment & Plan    1. CAD: S/p NSTEMI, DESx 3-RCA in 2017.  ETT in 2021 was negative for ischemia.  Stable with no anginal symptoms. No indication for ischemic evaluation.  Continue aspirin, metoprolol, Lipitor, fenofibrate, and Lovaza.  2. Hypertension: BP well controlled. Continue current antihypertensive regimen.   3. Hyperlipidemia: LDL was 48.6 in 01/2022. Triglycerides were elevated at 243.  Monitored and managed per PCP.  Continue Lipitor,  fenofibrate, Lovaza.  Continue to encourage ongoing lifestyle modifications with diet and exercise.  4. Type 2 diabetes: A1c was 6.6 in 11/2021.  Monitored and managed per PCP.  5. Disposition: Follow-up in 1 year.     Lenna Sciara, NP 04/27/2022, 3:54 PM

## 2022-04-27 NOTE — Patient Instructions (Signed)
Medication Instructions:  Your physician recommends that you continue on your current medications as directed. Please refer to the Current Medication list given to you today.   *If you need a refill on your cardiac medications before your next appointment, please call your pharmacy*   Lab Work: NONE ordered at this time of appointment   If you have labs (blood work) drawn today and your tests are completely normal, you will receive your results only by: [place] (if you have MyChart) OR A paper copy in the mail If you have any lab test that is abnormal or we need to change your treatment, we will call you to review the results.   Testing/Procedures: NONE ordered at this time of appointment     Follow-Up: At [facility], you and your health needs are our priority.  As part of our continuing mission to provide you with exceptional heart care, we have created designated Provider Care Teams.  These Care Teams include your primary Cardiologist (physician) and Advanced Practice Providers (APPs -  Physician Assistants and Nurse Practitioners) who all work together to provide you with the care you need, when you need it.  We recommend signing up for the patient portal called "MyChart".  Sign up information is provided on this After Visit Summary.  MyChart is used to connect with patients for Virtual Visits (Telemedicine).  Patients are able to view lab/test results, encounter notes, upcoming appointments, etc.  Non-urgent messages can be sent to your provider as well.   To learn more about what you can do with MyChart, go to [email or web address].    Your next appointment:   1 year(s)  The format for your next appointment:   In Person  Provider:   Kirk Ruths, MD     Other Instructions   Important Information About Sugar

## 2022-05-05 ENCOUNTER — Other Ambulatory Visit: Payer: Self-pay | Admitting: Cardiology

## 2022-05-22 ENCOUNTER — Telehealth: Payer: Self-pay | Admitting: Cardiology

## 2022-05-22 MED ORDER — METOPROLOL TARTRATE 25 MG PO TABS: ORAL_TABLET | ORAL | 10 refills | Status: DC

## 2022-05-22 NOTE — Telephone Encounter (Signed)
*  STAT* If patient is at the pharmacy, call can be transferred to refill team.   1. Which medications need to be refilled? (please list name of each medication and dose if known) Metoprolol  2. Which pharmacy/location (including street and city if local pharmacy) is medication to be sent to? [facility] [address]  3. Do they need a 30 day or 90 day supply? 90 days and refills- need this called in today- not enough for the weekend please

## 2022-06-18 ENCOUNTER — Other Ambulatory Visit: Payer: Self-pay | Admitting: Family Medicine

## 2022-07-14 ENCOUNTER — Other Ambulatory Visit: Payer: Self-pay | Admitting: Family Medicine

## 2022-08-03 ENCOUNTER — Encounter: Payer: Self-pay | Admitting: Family Medicine

## 2022-08-03 ENCOUNTER — Ambulatory Visit: Payer: 59 | Admitting: Family Medicine

## 2022-08-03 VITALS — BP 118/66 | HR 79 | Temp 98.2°F | Ht 72.0 in | Wt 229.0 lb

## 2022-08-03 DIAGNOSIS — E1121 Type 2 diabetes mellitus with diabetic nephropathy: Secondary | ICD-10-CM

## 2022-08-03 DIAGNOSIS — E785 Hyperlipidemia, unspecified: Secondary | ICD-10-CM

## 2022-08-03 DIAGNOSIS — E1169 Type 2 diabetes mellitus with other specified complication: Secondary | ICD-10-CM

## 2022-08-03 DIAGNOSIS — N1831 Chronic kidney disease, stage 3a: Secondary | ICD-10-CM | POA: Diagnosis not present

## 2022-08-03 DIAGNOSIS — D573 Sickle-cell trait: Secondary | ICD-10-CM

## 2022-08-03 DIAGNOSIS — Z23 Encounter for immunization: Secondary | ICD-10-CM | POA: Diagnosis not present

## 2022-08-03 DIAGNOSIS — E669 Obesity, unspecified: Secondary | ICD-10-CM

## 2022-08-03 DIAGNOSIS — E781 Pure hyperglyceridemia: Secondary | ICD-10-CM | POA: Diagnosis not present

## 2022-08-03 LAB — COMPREHENSIVE METABOLIC PANEL
ALT: 29 U/L (ref 0–53)
AST: 23 U/L (ref 0–37)
Albumin: 4.7 g/dL (ref 3.5–5.2)
Alkaline Phosphatase: 40 U/L (ref 39–117)
BUN: 26 mg/dL — ABNORMAL HIGH (ref 6–23)
CO2: 26 mEq/L (ref 19–32)
Calcium: 10 mg/dL (ref 8.4–10.5)
Chloride: 104 mEq/L (ref 96–112)
Creatinine, Ser: 1.74 mg/dL — ABNORMAL HIGH (ref 0.40–1.50)
GFR: 44.2 mL/min — ABNORMAL LOW (ref >60.00)
Glucose, Bld: 124 mg/dL — ABNORMAL HIGH (ref 70–99)
Potassium: 4.3 mEq/L (ref 3.5–5.1)
Sodium: 138 mEq/L (ref 135–145)
Total Bilirubin: 0.5 mg/dL (ref 0.2–1.2)
Total Protein: 8 g/dL (ref 6.0–8.3)

## 2022-08-03 LAB — LIPID PANEL
Cholesterol: 166 mg/dL (ref 0–200)
HDL: 33.7 mg/dL — ABNORMAL LOW (ref >39.00)
NonHDL: 131.83
Total CHOL/HDL Ratio: 5
Triglycerides: 264 mg/dL — ABNORMAL HIGH (ref 0.0–149.0)
VLDL: 52.8 mg/dL — ABNORMAL HIGH (ref 0.0–40.0)

## 2022-08-03 LAB — HEMOGLOBIN A1C: Hgb A1c MFr Bld: 6.3 % (ref 4.6–6.5)

## 2022-08-03 LAB — LDL CHOLESTEROL, DIRECT: Direct LDL: 70 mg/dL

## 2022-08-03 LAB — MICROALBUMIN / CREATININE URINE RATIO
Creatinine,U: 64.8 mg/dL
Microalb Creat Ratio: 2 mg/g (ref 0.0–30.0)
Microalb, Ur: 1.3 mg/dL (ref 0.0–1.9)

## 2022-08-03 NOTE — Assessment & Plan Note (Signed)
Lab today Takes fenofibrate Diet is improved

## 2022-08-03 NOTE — Progress Notes (Signed)
Subjective:    Patient ID: Andrew Burgess, male    DOB: Nov 10, 1970, 52 y.o.   MRN: [id number]  HPI Pt presents for 6 mo f/u of DM2 and chronic health problems   Wt Readings from Last 3 Encounters:  08/03/22 229 lb (103.9 kg)  04/27/22 230 lb (104.3 kg)  02/03/22 226 lb (102.5 kg)   31.06 kg/m    Flu shot - will get today   Walking 3-5 times per week and rides bike also  Push ups  (Careful due to shoulder injury)   Feel pretty good   Vitals:   08/03/22 0911  BP: 118/66  Pulse: 79  Temp: 98.2 F (36.8 C)  SpO2: 96%    DM2 Lab Results  Component Value Date   HGBA1C 6.6 (H) 11/25/2021    Jardiance 10 mg daily   Some sugar as low as 95  Highest 180    Sugar went up when he ate some cake  Otherwise avoids sugar  Eats too much bread   Annual eye exam in July    CAD Takes metoprolol  Stable and doing well    CKD  Sees nephrology  Had a good check  Drinks a gallon of water daily  Had visit at [facility] in oct / goes yearly  Hyperlipidemia Lab Results  Component Value Date   CHOL 130 02/03/2022   HDL 30.10 (L) 02/03/2022   LDLCALC 20 07/26/2017   LDLDIRECT 54.0 02/03/2022   TRIG 243.0 (H) 02/03/2022   CHOLHDL 4 02/03/2022   Fenofibrate 160 mg daily  Atorvastatin 80 mg daily  Lovaza 1.6 mg daily   Needs to cut red meat some more    Has h/o [place] trait No symptoms  Lab Results  Component Value Date   WBC 6.4 02/03/2022   HGB 13.8 02/03/2022   HCT 42.6 02/03/2022   MCV 88.1 02/03/2022   PLT 280.0 02/03/2022   Patient Active Problem List   Diagnosis Date Noted   Colon cancer screening 03/06/2019   Prostate cancer screening 10/23/2018   Chronic kidney disease, stage 3a ([place]) 05/02/2018   Family history of coronary artery disease 02/17/2018   Obesity (BMI 30-39.9) 10/25/2017   Sickle cell trait ([place]) 07/26/2017   Routine general medical examination at a health care facility 11/08/2016   Low testosterone in male 06/02/2016    Low libido 05/22/2016   CAD S/P percutaneous coronary angioplasty    Hyperlipidemia associated with type 2 diabetes mellitus ([place])    Type 2 diabetes with nephropathy ([place])    Status post coronary artery stent placement    Acute MI inferior lateral first episode care ([place]) 01/18/2016   Screen for STD (sexually transmitted disease) 01/12/2011   ALLERGIC RHINITIS 11/11/2007   Hypertriglyceridemia 03/04/2007   Past Medical History:  Diagnosis Date   Allergy    CAD (coronary artery disease)    a. 01/2016: Peak troponin 23.15. Left heart cath on 01/21/16 showed 100% occluded mid-dist RCA occlusion with heavy thrombus burden s/p DES. Rx asp thrombectomy and post procedure Aggrastat. Re look 01/21/16 revealed persistent thrombus in prox aspect of stent as well as a distal lesion both of which were intervened on successfully with placement of 2 additional DES.   Diabetes mellitus ([place])    Exposure to COVID-19 virus 06/05/2019   Hyperglycemia    Hyperlipidemia    triglyceriedes    Hypertriglyceridemia    Myocardial infarction ([place]) 2017   Obesity    Sickle cell trait ([place])  Past Surgical History:  Procedure Laterality Date   ANKLE SURGERY     CARDIAC CATHETERIZATION N/A 01/18/2016   Procedure: Left Heart Cath and Coronary Angiography;  Surgeon: Jettie Booze, MD;  Location: [facility];  Service: Cardiovascular;  Laterality: N/A;   CARDIAC CATHETERIZATION N/A 01/18/2016   Procedure: Coronary Stent Intervention;  Surgeon: Jettie Booze, MD;  Location: [facility];  Service: Cardiovascular;  Laterality: N/A;  MID RCA   CARDIAC CATHETERIZATION N/A 01/21/2016   Procedure: Left Heart Cath and Coronary Angiography;  Surgeon: Troy Sine, MD;  Location: [facility];  Service: Cardiovascular;  Laterality: N/A;   CARDIAC CATHETERIZATION N/A 01/21/2016   Procedure: Coronary Stent Intervention;  Surgeon: Troy Sine, MD;  Location: [facility];  Service:  Cardiovascular;  Laterality: N/A;   Social History   Tobacco Use   Smoking status: Former    Types: Cigars    Quit date: 01/18/2012    Years since quitting: 10.5   Smokeless tobacco: Never  Vaping Use   Vaping Use: Never used  Substance Use Topics   Alcohol use: Yes    Alcohol/week: 0.0 standard drinks of alcohol    Comment: rare   Drug use: No   Family History  Problem Relation Age of Onset   Coronary artery disease Mother    Heart attack Mother    Hyperlipidemia Mother    Alcohol abuse Father    Hyperlipidemia Father    Hypertension Father    Hypertension Brother    Cancer Maternal Grandmother        brain   Colon polyps Neg Hx    Colon cancer Neg Hx    Esophageal cancer Neg Hx    Stomach cancer Neg Hx    Rectal cancer Neg Hx    No Known Allergies Current Outpatient Medications on File Prior to Visit  Medication Sig Dispense Refill   aspirin EC 81 MG tablet Take 81 mg by mouth daily.     atorvastatin (LIPITOR) 80 MG tablet Take 1 tablet (80 mg total) by mouth daily. Schedule an appointment for further refills 90 tablet 2   blood glucose meter kit and supplies Dispense based on patient and insurance preference. Use up to four times daily as directed. (FOR ICD-10 E10.9, E11.9). 1 each 0   empagliflozin (JARDIANCE) 10 MG TABS tablet TAKE 1 TABLET BY MOUTH EVERY DAY BEFORE BREAKFAST 90 tablet 0   fenofibrate 160 MG tablet Take 1 tablet (160 mg total) by mouth daily. 90 tablet 3   glucose blood (ONETOUCH ULTRA) test strip USE AS INSTRUCTED TO CHECK BLOOD SUGAR TWICE DAILY 100 strip 1   metoprolol tartrate (LOPRESSOR) 25 MG tablet TAKE 1 TABLET 2 TIMES DAILY. 30 tablet 10   nitroGLYCERIN (NITROSTAT) 0.4 MG SL tablet PLACE 1 TABLET UNDER THE TONGUE EVERY 5 MINUTES AS NEEDED FOR CHEST PAIN (3 DOSES MAX). 25 tablet 2   omega-3 acid ethyl esters (LOVAZA) 1 g capsule TAKE 1 CAPSULE BY MOUTH TWICE A DAY 180 capsule 0   No current facility-administered medications on file prior to  visit.     Review of Systems  Constitutional:  Negative for activity change, appetite change, fever and unexpected weight change.  HENT:  Negative for congestion, rhinorrhea, sore throat and trouble swallowing.   Eyes:  Negative for pain, redness, itching and visual disturbance.  Respiratory:  Negative for cough, chest tightness, shortness of breath and wheezing.   Cardiovascular:  Negative  for chest pain and palpitations.  Gastrointestinal:  Negative for abdominal pain, blood in stool, constipation, diarrhea and nausea.  Endocrine: Negative for cold intolerance, heat intolerance, polydipsia and polyuria.  Genitourinary:  Negative for difficulty urinating, dysuria, frequency and urgency.  Musculoskeletal:  Negative for arthralgias, joint swelling and myalgias.  Skin:  Negative for pallor and rash.  Neurological:  Negative for dizziness, tremors, weakness, numbness and headaches.  Hematological:  Negative for adenopathy. Does not bruise/bleed easily.  Psychiatric/Behavioral:  Negative for decreased concentration and dysphoric mood. The patient is not nervous/anxious.        Objective:   Physical Exam Constitutional:      General: He is not in acute distress.    Appearance: Normal appearance. He is well-developed. He is obese. He is not ill-appearing or diaphoretic.  HENT:     Head: Normocephalic and atraumatic.  Eyes:     General: No scleral icterus.    Conjunctiva/sclera: Conjunctivae normal.     Pupils: Pupils are equal, round, and reactive to light.  Neck:     Thyroid: No thyromegaly.     Vascular: No carotid bruit or JVD.  Cardiovascular:     Rate and Rhythm: Normal rate and regular rhythm.     Heart sounds: Normal heart sounds.     No gallop.  Pulmonary:     Effort: Pulmonary effort is normal. No respiratory distress.     Breath sounds: Normal breath sounds. No stridor. No wheezing, rhonchi or rales.  Abdominal:     General: There is no distension or abdominal bruit.      Palpations: Abdomen is soft.  Musculoskeletal:     Cervical back: Normal range of motion and neck supple.     Right lower leg: No edema.     Left lower leg: No edema.  Lymphadenopathy:     Cervical: No cervical adenopathy.  Skin:    General: Skin is warm and dry.     Coloration: Skin is not pale.     Findings: No rash.  Neurological:     Mental Status: He is alert.     Coordination: Coordination normal.     Deep Tendon Reflexes: Reflexes are normal and symmetric. Reflexes normal.  Psychiatric:        Mood and Affect: Mood normal.           Assessment & Plan:   Problem List Items Addressed This Visit       Endocrine   Hyperlipidemia associated with type 2 diabetes mellitus ([place])    Disc goals for lipids and reasons to control them Rev last labs with pt Rev low sat fat diet in detail  Lab today  In setting of CAD Taking  Fenofibrate 160 mg daily with atorvastatin 80 Also lovaza 1.6 mg daily         Relevant Orders   Lipid panel   Type 2 diabetes with nephropathy ([place]) - Primary    Lab Results  Component Value Date   HGBA1C 6.6 (H) 11/25/2021  Due for labs today  Suspect stable  Good readings at home  Eye exam utd Microalb and A1c ordered   Enc him to add some strength training      Relevant Orders   Hemoglobin A1c   Microalbumin / creatinine urine ratio     Genitourinary   Chronic kidney disease, stage 3a ([place])    Has been stable Labs today  Seen by nephrology/ [facility] Last visit rev from [employer] yearly  Good water intake, commended       Relevant Orders   Comprehensive metabolic panel     Other   Hypertriglyceridemia    Lab today Takes fenofibrate Diet is improved      Relevant Orders   Lipid panel   Obesity (BMI 30-39.9)    Discussed how this problem influences overall health and the risks it imposes  Reviewed plan for weight loss with lower calorie diet (via better food choices and also portion control or program like  weight watchers) and exercise building up to or more than 30 minutes 5 days per week including some aerobic activity        Sickle cell trait ([facility])    Nl cbc in aug  No symptoms  Continue to monitor       Other Visit Diagnoses     Need for influenza vaccination       Relevant Orders   Flu Vaccine QUAD 6+ mos PF IM (Fluarix Quad PF)

## 2022-08-03 NOTE — Assessment & Plan Note (Signed)
Disc goals for lipids and reasons to control them Rev last labs with pt Rev low sat fat diet in detail  Lab today  In setting of CAD Taking  Fenofibrate 160 mg daily with atorvastatin 80 Also lovaza 1.6 mg daily

## 2022-08-03 NOTE — Assessment & Plan Note (Signed)
Discussed how this problem influences overall health and the risks it imposes  Reviewed plan for weight loss with lower calorie diet (via better food choices and also portion control or program like weight watchers) and exercise building up to or more than 30 minutes 5 days per week including some aerobic activity    

## 2022-08-03 NOTE — Assessment & Plan Note (Signed)
Nl cbc in aug  No symptoms  Continue to monitor

## 2022-08-03 NOTE — Assessment & Plan Note (Signed)
Has been stable Labs today  Seen by nephrology/ [facility] Last visit rev from [employer] yearly  Good water intake, commended

## 2022-08-03 NOTE — Assessment & Plan Note (Signed)
Lab Results  Component Value Date   HGBA1C 6.6 (H) 11/25/2021   Due for labs today  Suspect stable  Good readings at home  Eye exam utd Microalb and A1c ordered   Enc him to add some strength training

## 2022-08-03 NOTE — Patient Instructions (Addendum)
Think about 1/2 sandwich instead of whole (or open faced one) for lunch to reduce the carbs and sugar   Aim for morning /fasting glucose 120s or below 2 hours after a meal, 140 or below   Flu shot today   Keep exercising  Think about adding some strength training   Labs today Urine test for protein today

## 2022-08-10 ENCOUNTER — Ambulatory Visit: Payer: 59 | Admitting: Family Medicine

## 2022-09-27 ENCOUNTER — Other Ambulatory Visit: Payer: Self-pay | Admitting: Family Medicine

## 2022-09-29 ENCOUNTER — Other Ambulatory Visit: Payer: Self-pay | Admitting: Family Medicine

## 2022-09-29 DIAGNOSIS — E11319 Type 2 diabetes mellitus with unspecified diabetic retinopathy without macular edema: Secondary | ICD-10-CM

## 2022-09-30 ENCOUNTER — Telehealth: Payer: Self-pay | Admitting: Family Medicine

## 2022-09-30 NOTE — Telephone Encounter (Signed)
empagliflozin (JARDIANCE) 10 MG TABS tablet apologies this is the medication, realized I never sent it.

## 2022-09-30 NOTE — Telephone Encounter (Signed)
Patient called the office stating he was having some sexual side effects and believes it may be a side effect of medication he just started. Would like a call back to discuss this, please advise [phone].

## 2022-09-30 NOTE — Telephone Encounter (Signed)
Left VM requesting pt to call the office back 

## 2022-09-30 NOTE — Telephone Encounter (Signed)
[place] has been known to increase risk of utis or skin fungal infections but I did not find any record of sexual side effects with it (like ED) Diabetes itself can cause some problems  The metoprolol he takes may also have that side effect   Please follow up for a visit to discuss further and see what we can figure out  Thanks

## 2022-09-30 NOTE — Telephone Encounter (Signed)
Which medicine is he referring to ?  I will look into it

## 2022-10-02 NOTE — Telephone Encounter (Signed)
Patient called back reviewed information with patient. Have set up for office visit next week.  No further action needed at this time.

## 2022-10-05 ENCOUNTER — Telehealth: Payer: Self-pay | Admitting: Cardiology

## 2022-10-05 ENCOUNTER — Ambulatory Visit: Payer: 59 | Admitting: Family Medicine

## 2022-10-05 ENCOUNTER — Encounter: Payer: Self-pay | Admitting: Family Medicine

## 2022-10-05 VITALS — BP 114/68 | HR 68 | Temp 97.6°F | Ht 72.0 in | Wt 229.2 lb

## 2022-10-05 DIAGNOSIS — I2510 Atherosclerotic heart disease of native coronary artery without angina pectoris: Secondary | ICD-10-CM

## 2022-10-05 DIAGNOSIS — N529 Male erectile dysfunction, unspecified: Secondary | ICD-10-CM | POA: Insufficient documentation

## 2022-10-05 DIAGNOSIS — E1121 Type 2 diabetes mellitus with diabetic nephropathy: Secondary | ICD-10-CM

## 2022-10-05 DIAGNOSIS — E785 Hyperlipidemia, unspecified: Secondary | ICD-10-CM

## 2022-10-05 DIAGNOSIS — Z9861 Coronary angioplasty status: Secondary | ICD-10-CM

## 2022-10-05 DIAGNOSIS — E1169 Type 2 diabetes mellitus with other specified complication: Secondary | ICD-10-CM

## 2022-10-05 DIAGNOSIS — E669 Obesity, unspecified: Secondary | ICD-10-CM

## 2022-10-05 NOTE — Assessment & Plan Note (Signed)
Doing well clinically  No angina symptoms  Has not needed nitro Rev last cardiology note from nov 2023  Bp and pulse and cholesterol and DM in control  Working on lifestyle change   Is struggling more with ED This is likely multifactorial- medicine, DM2, CKD, vasc dz  ? If metoprolol is playing a role or if it could be changed  ? If he would be safe for sildenafil or other med He plans to reach out to his cardiologist about this

## 2022-10-05 NOTE — Assessment & Plan Note (Signed)
Suspect multi factorial  Medication, low testosterone (though drive is better), DM, vasc dz, CKD may play a role  Metoprolol can cause- is on this for CAD  Unsure if it can be changed - will check in with cardiology  Also ? If sildenafil or other med would be safe (unsure if he wants to try)   Chronic problems are in fairly good control right now Would not tx testosterone def since drive is good

## 2022-10-05 NOTE — Telephone Encounter (Signed)
Called patient, advised that he seen his PCP and they recommended medications like viagra, they requested he reach out to [facility] if he was okay to start one of these medications.   Advised I would check with MD.

## 2022-10-05 NOTE — Progress Notes (Signed)
Subjective:    Patient ID: Andrew Burgess, male    DOB: Dec 07, 1970, 52 y.o.   MRN: [id number]  HPI Pt presents for c/o ED / ? Med side effects   Wt Readings from Last 3 Encounters:  10/05/22 229 lb 4 oz (104 kg)  08/03/22 229 lb (103.9 kg)  04/27/22 230 lb (104.3 kg)   31.09 kg/m  Vitals:   10/05/22 1040  BP: 114/68  Pulse: 68  Temp: 97.6 F (36.4 C)  SpO2: 97%   Quit smoking in 2013   His ED is worse lately  Trouble with both getting and keeping an erection   H/o CAD and DM and CKD   BP Readings from Last 3 Encounters:  10/05/22 114/68  08/03/22 118/66  04/27/22 116/72   Takes metoprolol 25 mg bid for CAD  Pulse Readings from Last 3 Encounters:  10/05/22 68  08/03/22 79  04/27/22 66   Has nitro prn   For cholesterol Lab Results  Component Value Date   CHOL 166 08/03/2022   HDL 33.70 (L) 08/03/2022   LDLCALC 20 07/26/2017   LDLDIRECT 70.0 08/03/2022   TRIG 264.0 (H) 08/03/2022   CHOLHDL 5 08/03/2022   Atorvastatin 80 mg daily  Lovaza 1 g bid  Fenofibrate 160 mg daily   DM2 with neuropathy   Lab Results  Component Value Date   HGBA1C 6.3 08/03/2022   Jardiance 10 mg daily  Past metformin - we stopped due to CKD  Lab Results  Component Value Date   MICROALBUR 1.3 08/03/2022   MICROALBUR 3.2 (H) 03/20/2020    He struggled with food choices  Eats the wrong things due to convenience   Eats grapes  Apples  Watermelon   protein Chicken breast  Steak  Salmon    Lab Results  Component Value Date   CREATININE 1.74 (H) 08/03/2022   BUN 26 (H) 08/03/2022   NA 138 08/03/2022   K 4.3 08/03/2022   CL 104 08/03/2022   CO2 26 08/03/2022   GFR 44.2  Sees nephrology  Prostate health Lab Results  Component Value Date   PSA 0.08 (L) 02/03/2022   PSA 0.09 (L) 02/27/2019    Mildly low testosterone in past  Lab Results  Component Value Date   TESTOSTERONE 244.56 (L) 05/22/2016   Drive is not low (that improved with exercise)    He is exercising regularly    Patient Active Problem List   Diagnosis Date Noted   Erectile dysfunction 10/05/2022   Colon cancer screening 03/06/2019   Prostate cancer screening 10/23/2018   Chronic kidney disease, stage 3a 05/02/2018   Family history of coronary artery disease 02/17/2018   Obesity (BMI 30-39.9) 10/25/2017   Sickle cell trait 07/26/2017   Routine general medical examination at a health care facility 11/08/2016   Low testosterone in male 06/02/2016   Low libido 05/22/2016   CAD S/P percutaneous coronary angioplasty    Hyperlipidemia associated with type 2 diabetes mellitus    Type 2 diabetes with nephropathy    Status post coronary artery stent placement    Acute MI inferior lateral first episode care 01/18/2016   Screen for STD (sexually transmitted disease) 01/12/2011   ALLERGIC RHINITIS 11/11/2007   Hypertriglyceridemia 03/04/2007   Past Medical History:  Diagnosis Date   Allergy    CAD (coronary artery disease)    a. 01/2016: Peak troponin 23.15. Left heart cath on 01/21/16 showed 100% occluded mid-dist RCA occlusion with heavy thrombus burden s/p  DES. Rx asp thrombectomy and post procedure Aggrastat. Re look 01/21/16 revealed persistent thrombus in prox aspect of stent as well as a distal lesion both of which were intervened on successfully with placement of 2 additional DES.   Diabetes mellitus    Exposure to COVID-19 virus 06/05/2019   Hyperglycemia    Hyperlipidemia    triglyceriedes    Hypertriglyceridemia    Myocardial infarction 2017   Obesity    Sickle cell trait    Past Surgical History:  Procedure Laterality Date   ANKLE SURGERY     CARDIAC CATHETERIZATION N/A 01/18/2016   Procedure: Left Heart Cath and Coronary Angiography;  Surgeon: Corky Crafts, MD;  Location: [facility] INVASIVE CV LAB;  Service: Cardiovascular;  Laterality: N/A;   CARDIAC CATHETERIZATION N/A 01/18/2016   Procedure: Coronary Stent Intervention;  Surgeon: Corky Crafts,  MD;  Location: [facility] INVASIVE CV LAB;  Service: Cardiovascular;  Laterality: N/A;  MID RCA   CARDIAC CATHETERIZATION N/A 01/21/2016   Procedure: Left Heart Cath and Coronary Angiography;  Surgeon: Lennette Bihari, MD;  Location: [facility] INVASIVE CV LAB;  Service: Cardiovascular;  Laterality: N/A;   CARDIAC CATHETERIZATION N/A 01/21/2016   Procedure: Coronary Stent Intervention;  Surgeon: Lennette Bihari, MD;  Location: [facility] INVASIVE CV LAB;  Service: Cardiovascular;  Laterality: N/A;   Social History   Tobacco Use   Smoking status: Former    Types: Cigars    Quit date: 01/18/2012    Years since quitting: 10.7   Smokeless tobacco: Never  Vaping Use   Vaping Use: Never used  Substance Use Topics   Alcohol use: Yes    Alcohol/week: 0.0 standard drinks of alcohol    Comment: rare   Drug use: No   Family History  Problem Relation Age of Onset   Coronary artery disease Mother    Heart attack Mother    Hyperlipidemia Mother    Alcohol abuse Father    Hyperlipidemia Father    Hypertension Father    Hypertension Brother    Cancer Maternal Grandmother        brain   Colon polyps Neg Hx    Colon cancer Neg Hx    Esophageal cancer Neg Hx    Stomach cancer Neg Hx    Rectal cancer Neg Hx    No Known Allergies Current Outpatient Medications on File Prior to Visit  Medication Sig Dispense Refill   aspirin EC 81 MG tablet Take 81 mg by mouth daily.     atorvastatin (LIPITOR) 80 MG tablet Take 1 tablet (80 mg total) by mouth daily. Schedule an appointment for further refills 90 tablet 2   blood glucose meter kit and supplies Dispense based on patient and insurance preference. Use up to four times daily as directed. (FOR ICD-10 E10.9, E11.9). 1 each 0   empagliflozin (JARDIANCE) 10 MG TABS tablet TAKE 1 TABLET BY MOUTH EVERY DAY BEFORE BREAKFAST 90 tablet 1   fenofibrate 160 MG tablet Take 1 tablet (160 mg total) by mouth daily. 90 tablet 3   metoprolol tartrate (LOPRESSOR) 25 MG tablet TAKE 1 TABLET 2  TIMES DAILY. 30 tablet 10   nitroGLYCERIN (NITROSTAT) 0.4 MG SL tablet PLACE 1 TABLET UNDER THE TONGUE EVERY 5 MINUTES AS NEEDED FOR CHEST PAIN (3 DOSES MAX). 25 tablet 2   omega-3 acid ethyl esters (LOVAZA) 1 g capsule TAKE 1 CAPSULE BY MOUTH TWICE A DAY 180 capsule 0   ONETOUCH ULTRA test strip USE AS INSTRUCTED TO CHECK  BLOOD SUGAR TWICE DAILY 100 strip 1   No current facility-administered medications on file prior to visit.     Review of Systems  Constitutional:  Negative for activity change, appetite change, fatigue, fever and unexpected weight change.  HENT:  Negative for congestion, rhinorrhea, sore throat and trouble swallowing.   Eyes:  Negative for pain, redness, itching and visual disturbance.  Respiratory:  Negative for cough, chest tightness, shortness of breath and wheezing.   Cardiovascular:  Negative for chest pain and palpitations.  Gastrointestinal:  Negative for abdominal pain, blood in stool, constipation, diarrhea and nausea.  Endocrine: Negative for cold intolerance, heat intolerance, polydipsia and polyuria.  Genitourinary:  Negative for difficulty urinating, dysuria, frequency and urgency.       ED  Musculoskeletal:  Negative for arthralgias, joint swelling and myalgias.  Skin:  Negative for pallor and rash.  Neurological:  Negative for dizziness, tremors, weakness, numbness and headaches.  Hematological:  Negative for adenopathy. Does not bruise/bleed easily.  Psychiatric/Behavioral:  Negative for decreased concentration and dysphoric mood. The patient is not nervous/anxious.        Objective:   Physical Exam Constitutional:      General: He is not in acute distress.    Appearance: Normal appearance. He is well-developed. He is obese. He is not ill-appearing or diaphoretic.  HENT:     Head: Normocephalic and atraumatic.  Eyes:     Conjunctiva/sclera: Conjunctivae normal.     Pupils: Pupils are equal, round, and reactive to light.  Neck:     Thyroid: No  thyromegaly.     Vascular: No carotid bruit or JVD.  Cardiovascular:     Rate and Rhythm: Normal rate and regular rhythm.     Heart sounds: Normal heart sounds.     No gallop.  Pulmonary:     Effort: Pulmonary effort is normal. No respiratory distress.     Breath sounds: Normal breath sounds. No wheezing or rales.  Abdominal:     General: There is no distension or abdominal bruit.     Palpations: Abdomen is soft. There is no mass.     Tenderness: There is no abdominal tenderness. There is no right CVA tenderness or left CVA tenderness.     Comments: No suprapubic tenderness or fullness    Musculoskeletal:     Cervical back: Normal range of motion and neck supple.     Right lower leg: No edema.     Left lower leg: No edema.  Lymphadenopathy:     Cervical: No cervical adenopathy.  Skin:    General: Skin is warm and dry.     Coloration: Skin is not pale.     Findings: No rash.  Neurological:     Mental Status: He is alert.     Coordination: Coordination normal.     Deep Tendon Reflexes: Reflexes are normal and symmetric. Reflexes normal.  Psychiatric:        Mood and Affect: Mood normal.           Assessment & Plan:   Problem List Items Addressed This Visit       Cardiovascular and Mediastinum   CAD S/P percutaneous coronary angioplasty    Doing well clinically  No angina symptoms  Has not needed nitro Rev last cardiology note from nov 2023  Bp and pulse and cholesterol and DM in control  Working on lifestyle change   Is struggling more with ED This is likely multifactorial- medicine, DM2, CKD, vasc dz  ?  If metoprolol is playing a role or if it could be changed  ? If he would be safe for sildenafil or other med He plans to reach out to his cardiologist about this        Endocrine   Hyperlipidemia associated with type 2 diabetes mellitus    Disc goals for lipids and reasons to control them Rev last labs with pt Rev low sat fat diet in detail Last LDL at  goal of 70 In setting of CAD Taking atorvastatin 80 mg daily Fenofibrate 160 mg daily with atorvastatin 80 Also lovaza 1.6 mg daily         Type 2 diabetes with nephropathy    Lab Results  Component Value Date   HGBA1C 6.3 08/03/2022  Has challenge with breakfast/end of work /picking up [employer] Disc protein sources that need no cooking and easy pre prep Fruit in moderation  Doing great with exercise  Continues jardiance 10 mg daily   DM may play a role in ED as well        Other   Erectile dysfunction - Primary    Suspect multi factorial  Medication, low testosterone (though drive is better), DM, vasc dz, CKD may play a role  Metoprolol can cause- is on this for CAD  Unsure if it can be changed - will check in with cardiology  Also ? If sildenafil or other med would be safe (unsure if he wants to try)   Chronic problems are in fairly good control right now Would not tx testosterone def since drive is good      Obesity (BMI 30-39.9)    Discussed how this problem influences overall health and the risks it imposes  Reviewed plan for weight loss with lower calorie diet (via better food choices and also portion control or program like weight watchers) and exercise building up to or more than 30 minutes 5 days per week including some aerobic activity   Working hard on exercise Disc some low glycemic options for breakfast

## 2022-10-05 NOTE — Patient Instructions (Addendum)
Check in with Dr Jens Som regarding your ED issues and medications  I don't know if metoprolol can be switched to anything else   Would you be safe to try viagra ?   Keep exercising   Work on keeping a cooler with options for breakfast  Fruit with protein  Yogurt  Nuts or nut butters   Go on line and look up recipes for overnight oats (without added sugar)  Protein sources Nuts  Nut butters Low fat dairy Dried beans  Eggs  Soy products  Meat and fish

## 2022-10-05 NOTE — Telephone Encounter (Signed)
Pt c/o medication issue:  1. Name of Medication: Viagra   2. How are you currently taking this medication (dosage and times per day)?   3. Are you having a reaction (difficulty breathing--STAT)?   4. What is your medication issue? Patient would like a call back to discuss the kinds of medication that would be okay for him today for ED. Please advise.

## 2022-10-05 NOTE — Assessment & Plan Note (Signed)
Disc goals for lipids and reasons to control them Rev last labs with pt Rev low sat fat diet in detail Last LDL at goal of 70 In setting of CAD Taking atorvastatin 80 mg daily Fenofibrate 160 mg daily with atorvastatin 80 Also lovaza 1.6 mg daily

## 2022-10-05 NOTE — Telephone Encounter (Signed)
Left message for patient with Dr Creshaw's recommendations.   

## 2022-10-05 NOTE — Assessment & Plan Note (Signed)
Lab Results  Component Value Date   HGBA1C 6.3 08/03/2022   Has challenge with breakfast/end of work /picking up [employer] Disc protein sources that need no cooking and easy pre prep Fruit in moderation  Doing great with exercise  Continues jardiance 10 mg daily   DM may play a role in ED as well

## 2022-10-05 NOTE — Assessment & Plan Note (Signed)
Discussed how this problem influences overall health and the risks it imposes  Reviewed plan for weight loss with lower calorie diet (via better food choices and also portion control or program like weight watchers) and exercise building up to or more than 30 minutes 5 days per week including some aerobic activity   Working hard on exercise Disc some low glycemic options for breakfast

## 2022-10-06 ENCOUNTER — Telehealth: Payer: Self-pay | Admitting: Family Medicine

## 2022-10-06 MED ORDER — SILDENAFIL CITRATE 50 MG PO TABS: 50.0000 mg | ORAL_TABLET | Freq: Every day | ORAL | 3 refills | Status: DC | PRN

## 2022-10-06 NOTE — Addendum Note (Signed)
Addended by: Roxy Manns A on: 10/06/2022 04:49 PM   Modules accepted: Orders

## 2022-10-06 NOTE — Telephone Encounter (Signed)
Pt called stating he discussed with [facility] about refilling his Viagra meds. Pt stated he was instructed by [facility] to discuss it with his cardiologist, Dr. Jens Som, if he was cleared to still be on meds. Pt states it was cleared by [facility] & was told to ask [facility] to refill the meds. Preferred pharmacy [id number] - [place], [place] - [address] Call back # [phone]

## 2022-10-06 NOTE — Telephone Encounter (Signed)
You can also see the phone note from cardiology from yesterday clearing him also

## 2022-10-06 NOTE — Telephone Encounter (Signed)
I sent it  As cardiology said-do not mix with the nitroglycerin  If side effects or if not effective please let us know

## 2022-10-06 NOTE — Telephone Encounter (Signed)
Pt notified Rx sent and advised of Dr. [facility]'s instructions  

## 2022-10-10 ENCOUNTER — Other Ambulatory Visit: Payer: Self-pay | Admitting: Family Medicine

## 2022-11-03 LAB — LAB REPORT - SCANNED
Albumin, Urine POC: 5
Creatinine, POC: 1.62 mg/dL
EGFR: 50

## 2022-11-05 ENCOUNTER — Other Ambulatory Visit: Payer: Self-pay | Admitting: Family Medicine

## 2022-11-07 ENCOUNTER — Other Ambulatory Visit: Payer: Self-pay | Admitting: Cardiology

## 2022-11-10 LAB — MICROALBUMIN, URINE: Microalb, Ur: 4.7

## 2023-01-11 LAB — HM DIABETES EYE EXAM

## 2023-01-13 ENCOUNTER — Encounter (INDEPENDENT_AMBULATORY_CARE_PROVIDER_SITE_OTHER): Payer: Self-pay

## 2023-01-15 ENCOUNTER — Other Ambulatory Visit: Payer: Self-pay | Admitting: Family Medicine

## 2023-01-15 DIAGNOSIS — E11319 Type 2 diabetes mellitus with unspecified diabetic retinopathy without macular edema: Secondary | ICD-10-CM

## 2023-01-31 ENCOUNTER — Telehealth: Payer: Self-pay | Admitting: Family Medicine

## 2023-01-31 DIAGNOSIS — Z0000 Encounter for general adult medical examination without abnormal findings: Secondary | ICD-10-CM

## 2023-01-31 DIAGNOSIS — E1169 Type 2 diabetes mellitus with other specified complication: Secondary | ICD-10-CM

## 2023-01-31 DIAGNOSIS — E1121 Type 2 diabetes mellitus with diabetic nephropathy: Secondary | ICD-10-CM

## 2023-01-31 DIAGNOSIS — D573 Sickle-cell trait: Secondary | ICD-10-CM

## 2023-01-31 DIAGNOSIS — Z125 Encounter for screening for malignant neoplasm of prostate: Secondary | ICD-10-CM

## 2023-01-31 DIAGNOSIS — N1831 Chronic kidney disease, stage 3a: Secondary | ICD-10-CM

## 2023-01-31 NOTE — Telephone Encounter (Signed)
-----   Message from Lovena Neighbours sent at 01/18/2023 10:00 AM EDT ----- Regarding: Labs for 8.19.24 Please put physical lab orders in future. Thank you, Denny Peon

## 2023-02-01 ENCOUNTER — Other Ambulatory Visit (INDEPENDENT_AMBULATORY_CARE_PROVIDER_SITE_OTHER): Payer: 59

## 2023-02-01 DIAGNOSIS — E1121 Type 2 diabetes mellitus with diabetic nephropathy: Secondary | ICD-10-CM | POA: Diagnosis not present

## 2023-02-01 DIAGNOSIS — Z0000 Encounter for general adult medical examination without abnormal findings: Secondary | ICD-10-CM

## 2023-02-01 DIAGNOSIS — E1169 Type 2 diabetes mellitus with other specified complication: Secondary | ICD-10-CM

## 2023-02-01 DIAGNOSIS — N1831 Chronic kidney disease, stage 3a: Secondary | ICD-10-CM | POA: Diagnosis not present

## 2023-02-01 DIAGNOSIS — E785 Hyperlipidemia, unspecified: Secondary | ICD-10-CM

## 2023-02-01 DIAGNOSIS — D573 Sickle-cell trait: Secondary | ICD-10-CM

## 2023-02-01 DIAGNOSIS — Z125 Encounter for screening for malignant neoplasm of prostate: Secondary | ICD-10-CM

## 2023-02-01 LAB — COMPREHENSIVE METABOLIC PANEL
ALT: 33 U/L (ref 0–53)
AST: 24 U/L (ref 0–37)
Albumin: 4.5 g/dL (ref 3.5–5.2)
Alkaline Phosphatase: 57 U/L (ref 39–117)
BUN: 20 mg/dL (ref 6–23)
CO2: 20 mEq/L (ref 19–32)
Calcium: 9.5 mg/dL (ref 8.4–10.5)
Chloride: 108 mEq/L (ref 96–112)
Creatinine, Ser: 1.62 mg/dL — ABNORMAL HIGH (ref 0.40–1.50)
GFR: 47.99 mL/min — ABNORMAL LOW (ref >60.00)
Glucose, Bld: 122 mg/dL — ABNORMAL HIGH (ref 70–99)
Potassium: 4.3 mEq/L (ref 3.5–5.1)
Sodium: 141 mEq/L (ref 135–145)
Total Bilirubin: 0.4 mg/dL (ref 0.2–1.2)
Total Protein: 7.6 g/dL (ref 6.0–8.3)

## 2023-02-01 LAB — CBC WITH DIFFERENTIAL/PLATELET
Basophils Absolute: 0 10*3/uL (ref 0.0–0.1)
Basophils Relative: 0.6 % (ref 0.0–3.0)
Eosinophils Absolute: 0.2 10*3/uL (ref 0.0–0.7)
Eosinophils Relative: 2.3 % (ref 0.0–5.0)
HCT: 42.8 % (ref 39.0–52.0)
Hemoglobin: 13.7 g/dL (ref 13.0–17.0)
Lymphocytes Relative: 39.1 % (ref 12.0–46.0)
Lymphs Abs: 2.9 10*3/uL (ref 0.7–4.0)
MCHC: 32 g/dL (ref 30.0–36.0)
MCV: 89.9 fl (ref 78.0–100.0)
Monocytes Absolute: 0.6 10*3/uL (ref 0.1–1.0)
Monocytes Relative: 8.1 % (ref 3.0–12.0)
Neutro Abs: 3.7 10*3/uL (ref 1.4–7.7)
Neutrophils Relative %: 49.9 % (ref 43.0–77.0)
Platelets: 282 10*3/uL (ref 150.0–400.0)
RBC: 4.76 Mil/uL (ref 4.22–5.81)
RDW: 13.4 % (ref 11.5–15.5)
WBC: 7.3 10*3/uL (ref 4.0–10.5)

## 2023-02-01 LAB — LIPID PANEL
Cholesterol: 151 mg/dL (ref 0–200)
HDL: 24.4 mg/dL — ABNORMAL LOW (ref >39.00)
Total CHOL/HDL Ratio: 6
Triglycerides: 437 mg/dL — ABNORMAL HIGH (ref 0.0–149.0)

## 2023-02-01 LAB — PSA: PSA: 0.11 ng/mL (ref 0.10–4.00)

## 2023-02-01 LAB — LDL CHOLESTEROL, DIRECT: Direct LDL: 56 mg/dL

## 2023-02-01 LAB — TSH: TSH: 3 u[IU]/mL (ref 0.35–5.50)

## 2023-02-01 LAB — HEMOGLOBIN A1C: Hgb A1c MFr Bld: 6.2 % (ref 4.6–6.5)

## 2023-02-08 ENCOUNTER — Ambulatory Visit (INDEPENDENT_AMBULATORY_CARE_PROVIDER_SITE_OTHER): Payer: 59 | Admitting: Family Medicine

## 2023-02-08 ENCOUNTER — Encounter: Payer: Self-pay | Admitting: Family Medicine

## 2023-02-08 VITALS — BP 120/70 | HR 72 | Temp 97.9°F | Ht 71.25 in | Wt 229.2 lb

## 2023-02-08 DIAGNOSIS — R2242 Localized swelling, mass and lump, left lower limb: Secondary | ICD-10-CM

## 2023-02-08 DIAGNOSIS — Z7984 Long term (current) use of oral hypoglycemic drugs: Secondary | ICD-10-CM

## 2023-02-08 DIAGNOSIS — E119 Type 2 diabetes mellitus without complications: Secondary | ICD-10-CM | POA: Insufficient documentation

## 2023-02-08 DIAGNOSIS — I2510 Atherosclerotic heart disease of native coronary artery without angina pectoris: Secondary | ICD-10-CM

## 2023-02-08 DIAGNOSIS — Z0000 Encounter for general adult medical examination without abnormal findings: Secondary | ICD-10-CM

## 2023-02-08 DIAGNOSIS — E1169 Type 2 diabetes mellitus with other specified complication: Secondary | ICD-10-CM

## 2023-02-08 DIAGNOSIS — N529 Male erectile dysfunction, unspecified: Secondary | ICD-10-CM

## 2023-02-08 DIAGNOSIS — Z1211 Encounter for screening for malignant neoplasm of colon: Secondary | ICD-10-CM | POA: Diagnosis not present

## 2023-02-08 DIAGNOSIS — E1121 Type 2 diabetes mellitus with diabetic nephropathy: Secondary | ICD-10-CM

## 2023-02-08 DIAGNOSIS — D573 Sickle-cell trait: Secondary | ICD-10-CM

## 2023-02-08 DIAGNOSIS — Z23 Encounter for immunization: Secondary | ICD-10-CM | POA: Diagnosis not present

## 2023-02-08 DIAGNOSIS — E669 Obesity, unspecified: Secondary | ICD-10-CM

## 2023-02-08 DIAGNOSIS — E781 Pure hyperglyceridemia: Secondary | ICD-10-CM

## 2023-02-08 DIAGNOSIS — R2240 Localized swelling, mass and lump, unspecified lower limb: Secondary | ICD-10-CM | POA: Insufficient documentation

## 2023-02-08 DIAGNOSIS — Z125 Encounter for screening for malignant neoplasm of prostate: Secondary | ICD-10-CM

## 2023-02-08 DIAGNOSIS — K760 Fatty (change of) liver, not elsewhere classified: Secondary | ICD-10-CM | POA: Insufficient documentation

## 2023-02-08 DIAGNOSIS — N1831 Chronic kidney disease, stage 3a: Secondary | ICD-10-CM

## 2023-02-08 DIAGNOSIS — E785 Hyperlipidemia, unspecified: Secondary | ICD-10-CM

## 2023-02-08 MED ORDER — SILDENAFIL CITRATE 100 MG PO TABS: 100.0000 mg | ORAL_TABLET | Freq: Every day | ORAL | 11 refills | Status: DC | PRN

## 2023-02-08 NOTE — Assessment & Plan Note (Signed)
Triglycerides are up  Taking fenofibrate 160 mg daily  Also lovaa   Encouraged to watch fat and sugar in diet

## 2023-02-08 NOTE — Assessment & Plan Note (Signed)
Lab Results  Component Value Date   PSA 0.11 02/01/2023   PSA 0.08 (L) 02/03/2022   PSA 0.09 (L) 02/27/2019    No symptoms No fam history

## 2023-02-08 NOTE — Progress Notes (Signed)
Subjective:    Patient ID: Andrew Burgess, male    DOB: 1970-11-09, 52 y.o.   MRN: [id number]  HPI  Here for health maintenance exam and to review chronic medical problems   Wt Readings from Last 3 Encounters:  02/08/23 229 lb 4 oz (104 kg)  10/05/22 229 lb 4 oz (104 kg)  08/03/22 229 lb (103.9 kg)   31.75 kg/m  Vitals:   02/08/23 0820 02/08/23 0845  BP: (!) 146/78 120/70  Pulse: 72   Temp: 97.9 F (36.6 C)   SpO2: 97%     Immunization History  Administered Date(s) Administered   Influenza Split 05/23/2012, 04/05/2014   Influenza, Quadrivalent, Recombinant, Inj, Pf 04/10/2017   Influenza, Seasonal, Injecte, Preservative Fre 02/08/2023   Influenza,inj,Quad PF,6+ Mos 03/10/2016, 05/02/2018, 03/06/2019, 03/20/2020, 02/10/2021, 08/03/2022   PFIZER(Purple Top)SARS-COV-2 Vaccination 10/21/2019, 11/07/2019, 06/10/2020   Pneumococcal Polysaccharide-23 07/26/2017   Td 09/15/2004   Tdap 11/23/2016    Health Maintenance Due  Topic Date Due   Zoster Vaccines- Shingrix (1 of 2) Never done   Feeling pretty good   Flu shot - wants to get today   Shingrix - interested    Prostate health Lab Results  Component Value Date   PSA 0.11 02/01/2023   PSA 0.08 (L) 02/03/2022   PSA 0.09 (L) 02/27/2019   No prostate cancer in family  ED Viagra - 50 mg  (cardiology told him not to take on a day  Needs 100 mg to work- no side effects at all   Eye exam done in July   Colon cancer screening : colonoscopy utd 11/2021    Bone health  Falls-none Fractures-none  Supplements - mvi  Exercise  Walking  Yard work  [employer]  Weighted vest   Mood    02/08/2023    9:00 AM 08/03/2022    9:20 AM 02/03/2022    9:00 AM 11/25/2021    8:41 AM 03/20/2020   10:38 AM  Depression screen PHQ 2/9  Decreased Interest 0 0 0 0 0  Down, Depressed, Hopeless 1 1 0 0 0  PHQ - 2 Score 1 1 0 0 0  Altered sleeping 0 0  0 0  Tired, decreased energy 0 0  0 0  Change in appetite 0 0  0 0   Feeling bad or failure about yourself  0 0  0 0  Trouble concentrating 1 0  0 0  Moving slowly or fidgety/restless 0 0  0 0  Suicidal thoughts 0 0  0 0  PHQ-9 Score 2 1  0 0  Difficult doing work/chores Not difficult at all Not difficult at all  Not difficult at all Not difficult at all    PHQ and GAD are up  Does not feel like he needs counseling He worries about his 3 kids - nothing more than normal  Hyperlipidemia  Lab Results  Component Value Date   CHOL 151 02/01/2023   CHOL 166 08/03/2022   CHOL 130 02/03/2022   Lab Results  Component Value Date   HDL 24.40 (L) 02/01/2023   HDL 33.70 (L) 08/03/2022   HDL 30.10 (L) 02/03/2022   Lab Results  Component Value Date   LDLCALC 20 07/26/2017   LDLCALC 46 05/22/2016   LDLCALC UNABLE TO CALCULATE IF TRIGLYCERIDE OVER 400 mg/dL 43/32/9518   Lab Results  Component Value Date   TRIG (H) 02/01/2023    437.0 Triglyceride is over 400; calculations on Lipids are invalid.   TRIG  264.0 (H) 08/03/2022   TRIG 243.0 (H) 02/03/2022   Lab Results  Component Value Date   CHOLHDL 6 02/01/2023   CHOLHDL 5 08/03/2022   CHOLHDL 4 02/03/2022   Lab Results  Component Value Date   LDLDIRECT 56.0 02/01/2023   LDLDIRECT 70.0 08/03/2022   LDLDIRECT 54.0 02/03/2022   Atorvastatin 80 mg daily  Fenofibrate 160 mg daily  Omega 3 - lovaza  Has CAD/under care of cardiology   BP Readings from Last 3 Encounters:  02/08/23 120/70  10/05/22 114/68  08/03/22 118/66   Pulse Readings from Last 3 Encounters:  02/08/23 72  10/05/22 68  08/03/22 79   Metoprolol 25 mg bid   Lab Results  Component Value Date   NA 141 02/01/2023   K 4.3 02/01/2023   CO2 20 02/01/2023   GLUCOSE 122 (H) 02/01/2023   BUN 20 02/01/2023   CREATININE 1.62 (H) 02/01/2023   CALCIUM 9.5 02/01/2023   GFR 47.99 (L) 02/01/2023   EGFR 50.0 11/03/2022   GFRNONAA >60 07/15/2017   CKD Sees nephrology  Lab Results  Component Value Date   MICROALBUR 1.3  08/03/2022   MICROALBUR 3.2 (H) 03/20/2020       Found hepatic steatosis on imaging   Lab Results  Component Value Date   ALT 33 02/01/2023   AST 24 02/01/2023   ALKPHOS 57 02/01/2023   BILITOT 0.4 02/01/2023   Lab Results  Component Value Date   TSH 3.00 02/01/2023     DM2 Lab Results  Component Value Date   HGBA1C 6.2 02/01/2023   Jardiance 10 mg daily   History of [place] trait  Lab Results  Component Value Date   WBC 7.3 02/01/2023   HGB 13.7 02/01/2023   HCT 42.8 02/01/2023   MCV 89.9 02/01/2023   PLT 282.0 02/01/2023   Has a chronic lump left lower leg May be associated with an old injury    Patient Active Problem List   Diagnosis Date Noted   Hepatic steatosis 02/08/2023   Localized swelling, mass, or lump of lower extremity 02/08/2023   Diabetes mellitus treated with oral medication (HCC) 02/08/2023   Erectile dysfunction 10/05/2022   Colon cancer screening 03/06/2019   Prostate cancer screening 10/23/2018   Chronic kidney disease, stage 3a (HCC) 05/02/2018   Family history of coronary artery disease 02/17/2018   Obesity (BMI 30-39.9) 10/25/2017   Sickle cell trait (HCC) 07/26/2017   Routine general medical examination at a health care facility 11/08/2016   Low testosterone in male 06/02/2016   Low libido 05/22/2016   CAD S/P percutaneous coronary angioplasty    Hyperlipidemia associated with type 2 diabetes mellitus (HCC)    Type 2 diabetes with nephropathy (HCC)    Status post coronary artery stent placement    ALLERGIC RHINITIS 11/11/2007   Hypertriglyceridemia 03/04/2007   Past Medical History:  Diagnosis Date   Allergy    CAD (coronary artery disease)    a. 01/2016: Peak troponin 23.15. Left heart cath on 01/21/16 showed 100% occluded mid-dist RCA occlusion with heavy thrombus burden s/p DES. Rx asp thrombectomy and post procedure Aggrastat. Re look 01/21/16 revealed persistent thrombus in prox aspect of stent as well as a distal lesion both of  which were intervened on successfully with placement of 2 additional DES.   Diabetes mellitus (HCC)    Exposure to COVID-19 virus 06/05/2019   Hyperglycemia    Hyperlipidemia    triglyceriedes    Hypertriglyceridemia    Myocardial infarction (  HCC) 2017   Obesity    Sickle cell trait [facility])    Past Surgical History:  Procedure Laterality Date   ANKLE SURGERY     CARDIAC CATHETERIZATION N/A 01/18/2016   Procedure: Left Heart Cath and Coronary Angiography;  Surgeon: Corky Crafts, MD;  Location: [facility] INVASIVE CV LAB;  Service: Cardiovascular;  Laterality: N/A;   CARDIAC CATHETERIZATION N/A 01/18/2016   Procedure: Coronary Stent Intervention;  Surgeon: Corky Crafts, MD;  Location: [facility] INVASIVE CV LAB;  Service: Cardiovascular;  Laterality: N/A;  MID RCA   CARDIAC CATHETERIZATION N/A 01/21/2016   Procedure: Left Heart Cath and Coronary Angiography;  Surgeon: Lennette Bihari, MD;  Location: [facility] INVASIVE CV LAB;  Service: Cardiovascular;  Laterality: N/A;   CARDIAC CATHETERIZATION N/A 01/21/2016   Procedure: Coronary Stent Intervention;  Surgeon: Lennette Bihari, MD;  Location: [facility] INVASIVE CV LAB;  Service: Cardiovascular;  Laterality: N/A;   Social History   Tobacco Use   Smoking status: Former    Types: Cigars    Quit date: 01/18/2012    Years since quitting: 11.0   Smokeless tobacco: Never  Vaping Use   Vaping status: Never Used  Substance Use Topics   Alcohol use: Yes    Alcohol/week: 0.0 standard drinks of alcohol    Comment: rare   Drug use: No   Family History  Problem Relation Age of Onset   Coronary artery disease Mother    Heart attack Mother    Hyperlipidemia Mother    Alcohol abuse Father    Hyperlipidemia Father    Hypertension Father    Hypertension Brother    Cancer Maternal Grandmother        brain   Colon polyps Neg Hx    Colon cancer Neg Hx    Esophageal cancer Neg Hx    Stomach cancer Neg Hx    Rectal cancer Neg Hx    No Known Allergies Current  Outpatient Medications on File Prior to Visit  Medication Sig Dispense Refill   aspirin EC 81 MG tablet Take 81 mg by mouth daily.     atorvastatin (LIPITOR) 80 MG tablet Take 1 tablet (80 mg total) by mouth daily. 90 tablet 1   blood glucose meter kit and supplies Dispense based on patient and insurance preference. Use up to four times daily as directed. (FOR ICD-10 E10.9, E11.9). 1 each 0   empagliflozin (JARDIANCE) 10 MG TABS tablet TAKE 1 TABLET BY MOUTH EVERY DAY BEFORE BREAKFAST 90 tablet 1   fenofibrate 160 MG tablet Take 1 tablet (160 mg total) by mouth daily. 90 tablet 3   metoprolol tartrate (LOPRESSOR) 25 MG tablet TAKE 1 TABLET BY MOUTH TWICE A DAY 60 tablet 5   nitroGLYCERIN (NITROSTAT) 0.4 MG SL tablet PLACE 1 TABLET UNDER THE TONGUE EVERY 5 MINUTES AS NEEDED FOR CHEST PAIN (3 DOSES MAX). 25 tablet 2   omega-3 acid ethyl esters (LOVAZA) 1 g capsule TAKE 1 CAPSULE BY MOUTH TWICE A DAY 180 capsule 1   ONETOUCH ULTRA test strip USE AS INSTRUCTED TO CHECK BLOOD SUGAR TWICE DAILY 100 strip 1   No current facility-administered medications on file prior to visit.    Review of Systems  Constitutional:  Negative for activity change, appetite change, fatigue, fever and unexpected weight change.  HENT:  Negative for congestion, rhinorrhea, sore throat and trouble swallowing.   Eyes:  Negative for pain, redness, itching and visual disturbance.  Respiratory:  Negative for cough, chest tightness, shortness of  breath and wheezing.   Cardiovascular:  Negative for chest pain and palpitations.  Gastrointestinal:  Negative for abdominal pain, blood in stool, constipation, diarrhea and nausea.  Endocrine: Negative for cold intolerance, heat intolerance, polydipsia and polyuria.  Genitourinary:  Negative for difficulty urinating, dysuria, frequency and urgency.  Musculoskeletal:  Negative for arthralgias, joint swelling and myalgias.  Skin:  Negative for pallor and rash.  Neurological:  Negative  for dizziness, tremors, weakness, numbness and headaches.  Hematological:  Negative for adenopathy. Does not bruise/bleed easily.  Psychiatric/Behavioral:  Negative for decreased concentration and dysphoric mood. The patient is not nervous/anxious.        Objective:   Physical Exam Constitutional:      General: He is not in acute distress.    Appearance: Normal appearance. He is well-developed. He is obese. He is not ill-appearing or diaphoretic.  HENT:     Head: Normocephalic and atraumatic.     Right Ear: Tympanic membrane, ear canal and external ear normal.     Left Ear: Tympanic membrane, ear canal and external ear normal.     Nose: Nose normal. No congestion.     Mouth/Throat:     Mouth: Mucous membranes are moist.     Pharynx: Oropharynx is clear. No posterior oropharyngeal erythema.  Eyes:     General: No scleral icterus.       Right eye: No discharge.        Left eye: No discharge.     Conjunctiva/sclera: Conjunctivae normal.     Pupils: Pupils are equal, round, and reactive to light.  Neck:     Thyroid: No thyromegaly.     Vascular: No carotid bruit or JVD.  Cardiovascular:     Rate and Rhythm: Normal rate and regular rhythm.     Pulses: Normal pulses.     Heart sounds: Normal heart sounds.     No gallop.  Pulmonary:     Effort: Pulmonary effort is normal. No respiratory distress.     Breath sounds: Normal breath sounds. No wheezing or rales.     Comments: Good air exch Chest:     Chest wall: No tenderness.  Abdominal:     General: Bowel sounds are normal. There is no distension or abdominal bruit.     Palpations: Abdomen is soft. There is no mass.     Tenderness: There is no abdominal tenderness.     Hernia: No hernia is present.  Musculoskeletal:        General: No tenderness.     Cervical back: Normal range of motion and neck supple. No rigidity. No muscular tenderness.     Right lower leg: No edema.     Left lower leg: No edema.  Lymphadenopathy:      Cervical: No cervical adenopathy.  Skin:    General: Skin is warm and dry.     Coloration: Skin is not pale.     Findings: No erythema or rash.  Neurological:     Mental Status: He is alert.     Cranial Nerves: No cranial nerve deficit.     Motor: No abnormal muscle tone.     Coordination: Coordination normal.     Gait: Gait normal.     Deep Tendon Reflexes: Reflexes are normal and symmetric. Reflexes normal.  Psychiatric:        Mood and Affect: Mood normal.        Cognition and Memory: Cognition and memory normal.  Assessment & Plan:   Problem List Items Addressed This Visit       Cardiovascular and Mediastinum   CAD S/P percutaneous coronary angioplasty    Doing well  Per pt not needing nitroglycerin and no cp or shortness of breath  Aware not to take viagra a day he takes nitroglycerin   Continus metoprolol 25 mg bid        Relevant Medications   sildenafil (VIAGRA) 100 MG tablet     Digestive   Hepatic steatosis    Noted incidentally on imaging per nephrology  LFTs are normal  Discussed need for weight loss   Discussed etoh/ minimize as well (drinks occational spirits)        Endocrine   Diabetes mellitus treated with oral medication (HCC)    Lab Results  Component Value Date   HGBA1C 6.2 02/01/2023   Taking jardiance 10 mg daily  Discussed need for low glycemic diet /exercise and weight loss Normal foot exam Eye exam utd  Microalb utd and has CKD On statin       Hyperlipidemia associated with type 2 diabetes mellitus (HCC)    Disc goals for lipids and reasons to control them Rev last labs with pt Rev low sat fat diet in detail Triglycerides still high at 437 Low HDL Low LDL at 56  Plan to continue Atorvastatin 80 mg daily  Fenofibrate 160 mg daily  Omega 3 (lovaza)  Continue cardiology follow up in setting of CAD      Relevant Medications   sildenafil (VIAGRA) 100 MG tablet   Type 2 diabetes with nephropathy (HCC)     Lab Results  Component Value Date   HGBA1C 6.2 02/01/2023          Genitourinary   Chronic kidney disease, stage 3a (HCC)    Continues nephrology follow up  GFR 47.9 Encouraged fluid intake  Working on good dm control         Other   Colon cancer screening    Colonoscopy utd 11/2021       Erectile dysfunction    Viagra 100 mg works best  Tolerates well  No issues with blood pressure  Aware not to take on a day he takes nitroglycerin       Hypertriglyceridemia    Triglycerides are up  Taking fenofibrate 160 mg daily  Also lovaa   Encouraged to watch fat and sugar in diet       Relevant Medications   sildenafil (VIAGRA) 100 MG tablet   Localized swelling, mass, or lump of lower extremity    Has a stable bony feeling lump in right lower leg  May be related to old injury  Interested in imaging in the future  Will order xray future       Obesity (BMI 30-39.9)    .Discussed how this problem influences overall health and the risks it imposes  Reviewed plan for weight loss with lower calorie diet (via better food choices (lower glycemic and portion control) along with exercise building up to or more than 30 minutes 5 days per week including some aerobic activity and strength training         Prostate cancer screening    Lab Results  Component Value Date   PSA 0.11 02/01/2023   PSA 0.08 (L) 02/03/2022   PSA 0.09 (L) 02/27/2019    No symptoms No fam history       Routine general medical examination at a health care facility -  Primary    Reviewed health habits including diet and exercise and skin cancer prevention Reviewed appropriate screening tests for age  Also reviewed health mt list, fam hx and immunization status , as well as social and family history   See HPI Labs reviewed and ordered Encouraged to check on coverage of shingrix vaccine  Psa is low/stable  Colonoscopy is utd 11/2021  Eye exam utd from July  Discussed strength training for bone  health and better health PHQ of 2        Relevant Orders   Flu vaccine trivalent PF, 6mos and older(Flulaval,Afluria,Fluarix,Fluzone) (Completed)   Sickle cell trait (HCC)    Lab Results  Component Value Date   WBC 7.3 02/01/2023   HGB 13.7 02/01/2023   HCT 42.8 02/01/2023   MCV 89.9 02/01/2023   PLT 282.0 02/01/2023   No symptoms       Other Visit Diagnoses     Need for influenza vaccination       Relevant Orders   Flu vaccine trivalent PF, 6mos and older(Flulaval,Afluria,Fluarix,Fluzone) (Completed)

## 2023-02-08 NOTE — Assessment & Plan Note (Signed)
Noted incidentally on imaging per nephrology  LFTs are normal  Discussed need for weight loss   Discussed etoh/ minimize as well (drinks occational spirits)

## 2023-02-08 NOTE — Assessment & Plan Note (Signed)
Viagra 100 mg works best  Tolerates well  No issues with blood pressure  Aware not to take on a day he takes nitroglycerin

## 2023-02-08 NOTE — Patient Instructions (Addendum)
If you are interested in the shingles vaccine series (Shingrix), call your insurance or pharmacy to check on coverage and location it must be given.  If affordable - you can schedule it here or at your pharmacy depending on coverage   Flu shot today    Keep walking Add some strength training to your routine, this is important for bone and brain health and can reduce your risk of falls and help your body use insulin properly and regulate weight  Light weights, exercise bands , and internet videos are a good way to start  Yoga (chair or regular), machines , floor exercises or a gym with machines are also good options   Try to get most of your carbohydrates from produce (with the exception of white potatoes) and whole grains Eat less bread/pasta/rice/snack foods/cereals/sweets and other items from the middle of the grocery store (processed carbs)   Come in next week for an xray of your leg               Mediterranean Diet  Why follow it? Research shows. Those who follow the Mediterranean diet have a reduced risk of heart disease  The diet is associated with a reduced incidence of Parkinson's and Alzheimer's diseases People following the diet may have longer life expectancies and lower rates of chronic diseases  The Dietary Guidelines for Americans recommends the Mediterranean diet as an eating plan to promote health and prevent disease  What Is the Mediterranean Diet?  Healthy eating plan based on typical foods and recipes of Mediterranean-style cooking The diet is primarily a plant based diet; these foods should make up a majority of meals   Starches - Plant based foods should make up a majority of meals - They are an important sources of vitamins, minerals, energy, antioxidants, and fiber - Choose whole grains, foods high in fiber and minimally processed items  - Typical grain sources include wheat, oats, barley, corn, brown rice, bulgar, farro, millet, polenta, couscous  -  Various types of beans include chickpeas, lentils, fava beans, black beans, white beans   Fruits  Veggies - Large quantities of antioxidant rich fruits & veggies; 6 or more servings  - Vegetables can be eaten raw or lightly drizzled with oil and cooked  - Vegetables common to the traditional Mediterranean Diet include: artichokes, arugula, beets, broccoli, brussel sprouts, cabbage, carrots, celery, collard greens, cucumbers, eggplant, kale, leeks, lemons, lettuce, mushrooms, okra, onions, peas, peppers, potatoes, pumpkin, radishes, rutabaga, shallots, spinach, sweet potatoes, turnips, zucchini - Fruits common to the Mediterranean Diet include: apples, apricots, avocados, cherries, clementines, dates, figs, grapefruits, grapes, melons, nectarines, oranges, peaches, pears, pomegranates, strawberries, tangerines  Fats - Replace butter and margarine with healthy oils, such as olive oil, canola oil, and tahini  - Limit nuts to no more than a handful a day  - Nuts include walnuts, almonds, pecans, pistachios, pine nuts  - Limit or avoid candied, honey roasted or heavily salted nuts - Olives are central to the [employer] - can be eaten whole or used in a variety of dishes   Meats Protein - Limiting red meat: no more than a few times a month - When eating red meat: choose lean cuts and keep the portion to the size of deck of cards - Eggs: approx. 0 to 4 times a week  - Fish and lean poultry: at least 2 a week  - Healthy protein sources include, chicken, [place], lean beef, lamb - Increase intake of seafood such as  tuna, salmon, trout, mackerel, shrimp, scallops - Avoid or limit high fat processed meats such as sausage and bacon  Dairy - Include moderate amounts of low fat dairy products  - Focus on healthy dairy such as fat free yogurt, skim milk, low or reduced fat cheese - Limit dairy products higher in fat such as whole or 2% milk, cheese, ice cream  Alcohol - Moderate amounts of red wine is ok   - No more than 5 oz daily for women (all ages) and men older than age 59  - No more than 10 oz of wine daily for men younger than 71  Other - Limit sweets and other desserts  - Use herbs and spices instead of salt to flavor foods  - Herbs and spices common to the traditional Mediterranean Diet include: basil, bay leaves, chives, cloves, cumin, fennel, garlic, lavender, marjoram, mint, oregano, parsley, pepper, rosemary, sage, savory, sumac, tarragon, thyme   It's not just a diet, it's a lifestyle:  The Mediterranean diet includes lifestyle factors typical of those in the region  Foods, drinks and meals are best eaten with others and savored Daily physical activity is important for overall good health This could be strenuous exercise like running and aerobics This could also be more leisurely activities such as walking, housework, yard-work, or taking the stairs Moderation is the key; a balanced and healthy diet accommodates most foods and drinks Consider portion sizes and frequency of consumption of certain foods   Meal Ideas & Options:  Breakfast:  Whole wheat toast or whole wheat English muffins with peanut butter & hard boiled egg Steel cut oats topped with apples & cinnamon and skim milk  Fresh fruit: banana, strawberries, melon, berries, peaches  Smoothies: strawberries, bananas, greek yogurt, peanut butter Low fat greek yogurt with blueberries and granola  Egg white omelet with spinach and mushrooms Breakfast couscous: whole wheat couscous, apricots, skim milk, cranberries  Sandwiches:  Hummus and grilled vegetables (peppers, zucchini, squash) on whole wheat bread   Grilled chicken on whole wheat pita with lettuce, tomatoes, cucumbers or tzatziki  [place] salad on whole wheat bread: tuna salad made with greek yogurt, olives, red peppers, capers, green onions Garlic rosemary lamb pita: lamb sauted with garlic, rosemary, salt & pepper; add lettuce, cucumber, greek yogurt to pita -  flavor with lemon juice and black pepper  Seafood:  Mediterranean grilled salmon, seasoned with garlic, basil, parsley, lemon juice and black pepper Shrimp, lemon, and spinach whole-grain pasta salad made with low fat greek yogurt  Seared scallops with lemon orzo  Seared tuna steaks seasoned salt, pepper, coriander topped with tomato mixture of olives, tomatoes, olive oil, minced garlic, parsley, green onions and cappers  Meats:  Herbed greek chicken salad with kalamata olives, cucumber, feta  Red bell peppers stuffed with spinach, bulgur, lean ground beef (or lentils) & topped with feta   Kebabs: skewers of chicken, tomatoes, onions, zucchini, squash  [place] burgers: made with red onions, mint, dill, lemon juice, feta cheese topped with roasted red peppers Vegetarian Cucumber salad: cucumbers, artichoke hearts, celery, red onion, feta cheese, tossed in olive oil & lemon juice  Hummus and whole grain pita points with a greek salad (lettuce, tomato, feta, olives, cucumbers, red onion) Lentil soup with celery, carrots made with vegetable broth, garlic, salt and pepper  Tabouli salad: parsley, bulgur, mint, scallions, cucumbers, tomato, radishes, lemon juice, olive oil, salt and pepper.

## 2023-02-08 NOTE — Assessment & Plan Note (Signed)
Lab Results  Component Value Date   WBC 7.3 02/01/2023   HGB 13.7 02/01/2023   HCT 42.8 02/01/2023   MCV 89.9 02/01/2023   PLT 282.0 02/01/2023   No symptoms

## 2023-02-08 NOTE — Assessment & Plan Note (Signed)
Has a stable bony feeling lump in right lower leg  May be related to old injury  Interested in imaging in the future  Will order xray future

## 2023-02-08 NOTE — Assessment & Plan Note (Addendum)
Reviewed health habits including diet and exercise and skin cancer prevention Reviewed appropriate screening tests for age  Also reviewed health mt list, fam hx and immunization status , as well as social and family history   See HPI Labs reviewed and ordered Encouraged to check on coverage of shingrix vaccine  Psa is low/stable  Colonoscopy is utd 11/2021  Eye exam utd from July  Discussed strength training for bone health and better health PHQ of 2

## 2023-02-08 NOTE — Assessment & Plan Note (Signed)
Disc goals for lipids and reasons to control them Rev last labs with pt Rev low sat fat diet in detail Triglycerides still high at 437 Low HDL Low LDL at 56  Plan to continue Atorvastatin 80 mg daily  Fenofibrate 160 mg daily  Omega 3 (lovaza)  Continue cardiology follow up in setting of CAD

## 2023-02-08 NOTE — Assessment & Plan Note (Signed)
Continues nephrology follow up  GFR 47.9 Encouraged fluid intake  Working on good dm control

## 2023-02-08 NOTE — Assessment & Plan Note (Signed)
 Discussed how this problem influences overall health and the risks it imposes  Reviewed plan for weight loss with lower calorie diet (via better food choices (lower glycemic and portion control) along with exercise building up to or more than 30 minutes 5 days per week including some aerobic activity and strength training

## 2023-02-08 NOTE — Assessment & Plan Note (Signed)
Lab Results  Component Value Date   HGBA1C 6.2 02/01/2023

## 2023-02-08 NOTE — Assessment & Plan Note (Signed)
Colonoscopy utd 11/2021  

## 2023-02-08 NOTE — Assessment & Plan Note (Addendum)
Doing well  Per pt not needing nitroglycerin and no cp or shortness of breath  Aware not to take viagra a day he takes nitroglycerin   Continus metoprolol 25 mg bid

## 2023-02-08 NOTE — Assessment & Plan Note (Signed)
Lab Results  Component Value Date   HGBA1C 6.2 02/01/2023   Taking jardiance 10 mg daily  Discussed need for low glycemic diet /exercise and weight loss Normal foot exam Eye exam utd  Microalb utd and has CKD On statin

## 2023-03-01 ENCOUNTER — Encounter: Payer: Self-pay | Admitting: Nurse Practitioner

## 2023-03-01 ENCOUNTER — Ambulatory Visit: Payer: BC Managed Care – PPO | Attending: Nurse Practitioner | Admitting: Nurse Practitioner

## 2023-03-01 VITALS — BP 104/72 | HR 57 | Ht 72.0 in | Wt 222.0 lb

## 2023-03-01 DIAGNOSIS — E785 Hyperlipidemia, unspecified: Secondary | ICD-10-CM | POA: Diagnosis not present

## 2023-03-01 DIAGNOSIS — I2510 Atherosclerotic heart disease of native coronary artery without angina pectoris: Secondary | ICD-10-CM | POA: Diagnosis not present

## 2023-03-01 DIAGNOSIS — I10 Essential (primary) hypertension: Secondary | ICD-10-CM

## 2023-03-01 DIAGNOSIS — E119 Type 2 diabetes mellitus without complications: Secondary | ICD-10-CM

## 2023-03-01 NOTE — Progress Notes (Signed)
Office Visit    Patient Name: Andrew Burgess Date of Encounter: 03/01/2023  Primary Care Provider:  Judy Pimple, MD Primary Cardiologist:  Olga Millers, MD  Chief Complaint    52 year old male with a history of CAD s/p NSTEMI, DES x3-RCA (with staged thrombectomy) in 2017, hyperlipidemia, and type 2 diabetes who presents for follow-up related to CAD.  Past Medical History    Past Medical History:  Diagnosis Date   Allergy    CAD (coronary artery disease)    a. 01/2016: Peak troponin 23.15. Left heart cath on 01/21/16 showed 100% occluded mid-dist RCA occlusion with heavy thrombus burden s/p DES. Rx asp thrombectomy and post procedure Aggrastat. Re look 01/21/16 revealed persistent thrombus in prox aspect of stent as well as a distal lesion both of which were intervened on successfully with placement of 2 additional DES.   Diabetes mellitus (HCC)    Exposure to COVID-19 virus 06/05/2019   Hyperglycemia    Hyperlipidemia    triglyceriedes    Hypertriglyceridemia    Myocardial infarction [facility]) 2017   Obesity    Sickle cell trait [facility])    Past Surgical History:  Procedure Laterality Date   ANKLE SURGERY     CARDIAC CATHETERIZATION N/A 01/18/2016   Procedure: Left Heart Cath and Coronary Angiography;  Surgeon: Corky Crafts, MD;  Location: [facility] INVASIVE CV LAB;  Service: Cardiovascular;  Laterality: N/A;   CARDIAC CATHETERIZATION N/A 01/18/2016   Procedure: Coronary Stent Intervention;  Surgeon: Corky Crafts, MD;  Location: [facility] INVASIVE CV LAB;  Service: Cardiovascular;  Laterality: N/A;  MID RCA   CARDIAC CATHETERIZATION N/A 01/21/2016   Procedure: Left Heart Cath and Coronary Angiography;  Surgeon: Lennette Bihari, MD;  Location: [facility] INVASIVE CV LAB;  Service: Cardiovascular;  Laterality: N/A;   CARDIAC CATHETERIZATION N/A 01/21/2016   Procedure: Coronary Stent Intervention;  Surgeon: Lennette Bihari, MD;  Location: [facility] INVASIVE CV LAB;  Service: Cardiovascular;  Laterality:  N/A;    Allergies  Not on File  History of Present Illness    52 year old male with the above past medical history including CAD s/p NSTEMI, DES x3-RCA (with staged thrombectomy) in 2017, hyperlipidemia, and type 2 diabetes.   He was hospitalized in 01/2016 in the setting of NSTEMI.  LHC showed 100% occluded mid to distal RCA with heavy thrombus burden s/p DES.  Relook cath days later revealed persistent thrombus in the proximal aspect of stent as well as distal lesion both of which were intervened on successfully treated with placement of additional DES x2. 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  Echocardiogram at the time showed EF 60 to 65%, normal LV function, no RWMA, mild LVH, normal RV systolic function, no significant valvular abnormalities.  He was last seen in the office on 04/27/2022 and was stable from a cardiac standpoint.  He denied symptoms concerning for angina.   He presents today for follow-up.  Since his last visit he has done well from a cardiac standpoint.  He denies any symptoms concerning for angina.  He is in the process of changing jobs and states that he needs a letter for work stating that he was seen today.  Overall, he reports feeling well.  Home Medications    Current Outpatient Medications  Medication Sig Dispense Refill   aspirin EC 81 MG tablet Take 81 mg by mouth daily.     atorvastatin (LIPITOR) 80 MG tablet Take 1 tablet (80 mg total) by mouth daily.  90 tablet 1   blood glucose meter kit and supplies Dispense based on patient and insurance preference. Use up to four times daily as directed. (FOR ICD-10 E10.9, E11.9). 1 each 0   empagliflozin (JARDIANCE) 10 MG TABS tablet TAKE 1 TABLET BY MOUTH EVERY DAY BEFORE BREAKFAST 90 tablet 1   fenofibrate 160 MG tablet Take 1 tablet (160 mg total) by mouth daily. 90 tablet 3   metoprolol tartrate (LOPRESSOR) 25 MG tablet TAKE 1 TABLET BY MOUTH TWICE A DAY 60 tablet 5   nitroGLYCERIN (NITROSTAT) 0.4 MG SL tablet  PLACE 1 TABLET UNDER THE TONGUE EVERY 5 MINUTES AS NEEDED FOR CHEST PAIN (3 DOSES MAX). 25 tablet 2   omega-3 acid ethyl esters (LOVAZA) 1 g capsule TAKE 1 CAPSULE BY MOUTH TWICE A DAY 180 capsule 1   ONETOUCH ULTRA test strip USE AS INSTRUCTED TO CHECK BLOOD SUGAR TWICE DAILY 100 strip 1   sildenafil (VIAGRA) 100 MG tablet Take 1 tablet (100 mg total) by mouth daily as needed for erectile dysfunction. 30 minutes before sexual activity 12 tablet 11   No current facility-administered medications for this visit.     Review of Systems    He denies chest pain, palpitations, dyspnea, pnd, orthopnea, n, v, dizziness, syncope, edema, weight gain, or early satiety. All other systems reviewed and are otherwise negative except as noted above.   Physical Exam    VS:  BP 104/72   Pulse (!) 57   Ht 6' (1.829 m)   Wt 222 lb (100.7 kg)   SpO2 98%   BMI 30.11 kg/m   GEN: Well nourished, well developed, in no acute distress. HEENT: normal. Neck: Supple, no JVD, carotid bruits, or masses. Cardiac: RRR, no murmurs, rubs, or gallops. No clubbing, cyanosis, edema.  Radials/DP/PT 2+ and equal bilaterally.  Respiratory:  Respirations regular and unlabored, clear to auscultation bilaterally. GI: Soft, nontender, nondistended, BS + x 4. MS: no deformity or atrophy. Skin: warm and dry, no rash. Neuro:  Strength and sensation are intact. Psych: Normal affect.  Accessory Clinical Findings    ECG personally reviewed by me today -sinus bradycardia, 57 bpm, nonspecific T wave abnormality- no acute changes.   Lab Results  Component Value Date   WBC 7.3 02/01/2023   HGB 13.7 02/01/2023   HCT 42.8 02/01/2023   MCV 89.9 02/01/2023   PLT 282.0 02/01/2023   Lab Results  Component Value Date   CREATININE 1.62 (H) 02/01/2023   BUN 20 02/01/2023   NA 141 02/01/2023   K 4.3 02/01/2023   CL 108 02/01/2023   CO2 20 02/01/2023   Lab Results  Component Value Date   ALT 33 02/01/2023   AST 24 02/01/2023    ALKPHOS 57 02/01/2023   BILITOT 0.4 02/01/2023   Lab Results  Component Value Date   CHOL 151 02/01/2023   HDL 24.40 (L) 02/01/2023   LDLCALC 20 07/26/2017   LDLDIRECT 56.0 02/01/2023   TRIG (H) 02/01/2023    437.0 Triglyceride is over 400; calculations on Lipids are invalid.   CHOLHDL 6 02/01/2023    Lab Results  Component Value Date   HGBA1C 6.2 02/01/2023    Assessment & Plan    1. CAD: S/p NSTEMI, DESx 3-RCA in 2017.  ETT in 2021 was negative for ischemia.  Stable with no anginal symptoms. No indication for ischemic evaluation.  He is in the process of changing jobs.  He is requesting a general work note, letter provided in office  today.  Continue aspirin, metoprolol, Lipitor, fenofibrate, and Lovaza.  2. Hypertension: BP well controlled. Continue current antihypertensive regimen.   3. Hyperlipidemia: dLDL was 56 in 01/2023. Triglycerides were elevated at 437. Monitored and managed per PCP.  Continue Lipitor, fenofibrate, Lovaza.  Continue to encourage ongoing lifestyle modifications with diet and exercise.  We discussed possible referral to lipid clinic Pharm.D., will defer for now pending follow-up with PCP.  4. Type 2 diabetes: A1c was 6.2 in 01/2023.  Monitored and managed per PCP.  5. Disposition: Follow-up in 1 year.      Joylene Grapes, NP 03/01/2023, 9:47 AM

## 2023-03-01 NOTE — Patient Instructions (Addendum)
Medication Instructions:  Your physician recommends that you continue on your current medications as directed. Please refer to the Current Medication list given to you today.  *If you need a refill on your cardiac medications before your next appointment, please call your pharmacy*   Lab Work: NONE ordered at this time of appointment   Testing/Procedures: NONE ordered at this time of appointment   Follow-Up: At [facility], you and your health needs are our priority.  As part of our continuing mission to provide you with exceptional heart care, we have created designated Provider Care Teams.  These Care Teams include your primary Cardiologist (physician) and Advanced Practice Providers (APPs -  Physician Assistants and Nurse Practitioners) who all work together to provide you with the care you need, when you need it.  We recommend signing up for the patient portal called "MyChart".  Sign up information is provided on this After Visit Summary.  MyChart is used to connect with patients for Virtual Visits (Telemedicine).  Patients are able to view lab/test results, encounter notes, upcoming appointments, etc.  Non-urgent messages can be sent to your provider as well.   To learn more about what you can do with MyChart, go to [email or web address].    Your next appointment:   1 year(s)  Provider:   Olga Millers, MD

## 2023-03-09 ENCOUNTER — Other Ambulatory Visit: Payer: Self-pay | Admitting: Family Medicine

## 2023-03-10 NOTE — Telephone Encounter (Signed)
Prescription Request  03/10/2023  LOV: 02/08/2023  What is the name of the medication or equipment? fenofibrate 160 MG tablet & omega-3 acid ethyl esters (LOVAZA) 1 g capsule   Have you contacted your pharmacy to request a refill? No   Which pharmacy would you like this sent to?  [id number] [place], [place] - [address] [place] [address] Phone: [phone] Fax: [phone]    Patient notified that their request is being sent to the clinical staff for review and that they should receive a response within 2 business days.   Please advise at Mobile [phone] (mobile)

## 2023-03-18 ENCOUNTER — Other Ambulatory Visit (HOSPITAL_COMMUNITY): Payer: Self-pay

## 2023-03-24 ENCOUNTER — Other Ambulatory Visit (HOSPITAL_COMMUNITY): Payer: Self-pay

## 2023-03-24 ENCOUNTER — Telehealth: Payer: Self-pay | Admitting: Pharmacist

## 2023-03-24 NOTE — Telephone Encounter (Signed)
Pharmacy Patient Advocate Encounter   Received notification from CoverMyMeds that prior authorization for Omega-3-acid Ethyl Esters 1GM capsules is required/requested.   Insurance verification completed.   The patient is insured through [facility] .   Per test claim: PA required; PA submitted to [facility] via CoverMyMeds Key/confirmation #/EOC [id number] Status is pending

## 2023-03-26 ENCOUNTER — Other Ambulatory Visit: Payer: Self-pay | Admitting: Family Medicine

## 2023-03-26 ENCOUNTER — Other Ambulatory Visit (HOSPITAL_COMMUNITY): Payer: Self-pay

## 2023-03-26 NOTE — Telephone Encounter (Signed)
Pharmacy Patient Advocate Encounter  Received notification from [facility] that Prior Authorization for Omega-3 Ethyl Esters 1 GM Capsule has been APPROVED from 03-24-2023 to 03-23-2024. Ran test claim, Copay is $20.00 for 90 day supply. This test claim was processed through [facility]- copay amounts may vary at other pharmacies due to pharmacy/plan contracts, or as the patient moves through the different stages of their insurance plan.   PA #/Case ID/Reference #: [id number]

## 2023-04-03 ENCOUNTER — Other Ambulatory Visit: Payer: Self-pay | Admitting: Family Medicine

## 2023-05-18 ENCOUNTER — Emergency Department (HOSPITAL_BASED_OUTPATIENT_CLINIC_OR_DEPARTMENT_OTHER): Payer: BC Managed Care – PPO

## 2023-05-18 ENCOUNTER — Encounter (HOSPITAL_BASED_OUTPATIENT_CLINIC_OR_DEPARTMENT_OTHER): Payer: Self-pay | Admitting: Emergency Medicine

## 2023-05-18 ENCOUNTER — Emergency Department (HOSPITAL_BASED_OUTPATIENT_CLINIC_OR_DEPARTMENT_OTHER)
Admission: EM | Admit: 2023-05-18 | Discharge: 2023-05-18 | Disposition: A | Discharge status: Home / Self Care | Payer: BC Managed Care – PPO | Attending: Emergency Medicine | Admitting: Emergency Medicine

## 2023-05-18 ENCOUNTER — Other Ambulatory Visit: Payer: Self-pay

## 2023-05-18 DIAGNOSIS — Z87891 Personal history of nicotine dependence: Secondary | ICD-10-CM | POA: Insufficient documentation

## 2023-05-18 DIAGNOSIS — I2510 Atherosclerotic heart disease of native coronary artery without angina pectoris: Secondary | ICD-10-CM | POA: Insufficient documentation

## 2023-05-18 DIAGNOSIS — Y92410 Unspecified street and highway as the place of occurrence of the external cause: Secondary | ICD-10-CM | POA: Insufficient documentation

## 2023-05-18 DIAGNOSIS — M545 Low back pain, unspecified: Secondary | ICD-10-CM | POA: Diagnosis not present

## 2023-05-18 DIAGNOSIS — E119 Type 2 diabetes mellitus without complications: Secondary | ICD-10-CM | POA: Insufficient documentation

## 2023-05-18 DIAGNOSIS — Z8616 Personal history of COVID-19: Secondary | ICD-10-CM | POA: Insufficient documentation

## 2023-05-18 DIAGNOSIS — R079 Chest pain, unspecified: Secondary | ICD-10-CM | POA: Diagnosis not present

## 2023-05-18 DIAGNOSIS — R0789 Other chest pain: Secondary | ICD-10-CM | POA: Diagnosis not present

## 2023-05-18 DIAGNOSIS — I10 Essential (primary) hypertension: Secondary | ICD-10-CM | POA: Diagnosis not present

## 2023-05-18 LAB — I-STAT CHEM 8, ED
BUN: 24 mg/dL — ABNORMAL HIGH (ref 6–20)
Calcium, Ion: 1.14 mmol/L — ABNORMAL LOW (ref 1.15–1.40)
Chloride: 108 mmol/L (ref 98–111)
Creatinine, Ser: 1.7 mg/dL — ABNORMAL HIGH (ref 0.61–1.24)
Glucose, Bld: 132 mg/dL — ABNORMAL HIGH (ref 70–99)
HCT: 41 % (ref 39.0–52.0)
Hemoglobin: 13.9 g/dL (ref 13.0–17.0)
Potassium: 4.3 mmol/L (ref 3.5–5.1)
Sodium: 140 mmol/L (ref 135–145)
TCO2: 22 mmol/L (ref 22–32)

## 2023-05-18 LAB — TROPONIN I (HIGH SENSITIVITY)
Troponin I (High Sensitivity): 3 ng/L (ref <18)
Troponin I (High Sensitivity): 4 ng/L (ref <18)

## 2023-05-18 LAB — CBC
HCT: 38.3 % — ABNORMAL LOW (ref 39.0–52.0)
Hemoglobin: 12.9 g/dL — ABNORMAL LOW (ref 13.0–17.0)
MCH: 29.7 pg (ref 26.0–34.0)
MCHC: 33.7 g/dL (ref 30.0–36.0)
MCV: 88 fL (ref 80.0–100.0)
Platelets: 229 10*3/uL (ref 150–400)
RBC: 4.35 MIL/uL (ref 4.22–5.81)
RDW: 12.6 % (ref 11.5–15.5)
WBC: 8.7 10*3/uL (ref 4.0–10.5)
nRBC: 0 % (ref 0.0–0.2)

## 2023-05-18 MED ORDER — OXYCODONE HCL 5 MG PO TABS
5.0000 mg | ORAL_TABLET | Freq: Once | ORAL | Status: AC
  Administered 2023-05-18: 5 mg via ORAL
  Filled 2023-05-18: qty 1

## 2023-05-18 MED ORDER — NAPROXEN 500 MG PO TABS: 500.0000 mg | ORAL_TABLET | Freq: Two times a day (BID) | ORAL | 0 refills | Status: DC

## 2023-05-18 MED ORDER — METHOCARBAMOL 500 MG PO TABS
1000.0000 mg | ORAL_TABLET | Freq: Once | ORAL | Status: AC
  Administered 2023-05-18: 1000 mg via ORAL
  Filled 2023-05-18: qty 2

## 2023-05-18 MED ORDER — ASPIRIN 81 MG PO CHEW
324.0000 mg | CHEWABLE_TABLET | Freq: Once | ORAL | Status: AC
  Administered 2023-05-18: 324 mg via ORAL
  Filled 2023-05-18: qty 4

## 2023-05-18 MED ORDER — METHOCARBAMOL 500 MG PO TABS: 500.0000 mg | ORAL_TABLET | Freq: Three times a day (TID) | ORAL | 0 refills | Status: AC | PRN

## 2023-05-18 NOTE — ED Notes (Signed)
Patient transported to X-ray 
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
## 2023-05-18 NOTE — ED Notes (Signed)
Reviewed D/C information with the patient, pt verbalized understanding. No additional concerns at this time.

## 2023-06-06 ENCOUNTER — Other Ambulatory Visit: Payer: Self-pay | Admitting: Family Medicine

## 2023-06-06 ENCOUNTER — Other Ambulatory Visit: Payer: Self-pay | Admitting: Nurse Practitioner

## 2023-08-08 ENCOUNTER — Telehealth: Payer: Self-pay | Admitting: Family Medicine

## 2023-08-08 DIAGNOSIS — Z0000 Encounter for general adult medical examination without abnormal findings: Secondary | ICD-10-CM

## 2023-08-08 DIAGNOSIS — E1169 Type 2 diabetes mellitus with other specified complication: Secondary | ICD-10-CM

## 2023-08-08 DIAGNOSIS — N1831 Chronic kidney disease, stage 3a: Secondary | ICD-10-CM

## 2023-08-08 DIAGNOSIS — E781 Pure hyperglyceridemia: Secondary | ICD-10-CM

## 2023-08-08 DIAGNOSIS — E119 Type 2 diabetes mellitus without complications: Secondary | ICD-10-CM

## 2023-08-08 NOTE — Telephone Encounter (Signed)
-----   Message from Alvina Chou sent at 07/20/2023  2:50 PM EST ----- Regarding: Lab orders for Mon, 2.24.25 Patient is scheduled for CPX labs, please order future labs, Thanks , Camelia Eng

## 2023-08-09 ENCOUNTER — Other Ambulatory Visit: Payer: Self-pay

## 2023-08-16 ENCOUNTER — Ambulatory Visit: Payer: Self-pay | Admitting: Family Medicine

## 2023-08-16 ENCOUNTER — Other Ambulatory Visit: Payer: Self-pay

## 2023-08-31 ENCOUNTER — Ambulatory Visit: Payer: Self-pay | Admitting: Family Medicine

## 2023-08-31 ENCOUNTER — Other Ambulatory Visit: Payer: Self-pay

## 2023-08-31 ENCOUNTER — Encounter: Payer: Self-pay | Admitting: Family Medicine

## 2023-08-31 VITALS — BP 116/78 | HR 55 | Temp 98.6°F | Ht 72.0 in | Wt 220.0 lb

## 2023-08-31 DIAGNOSIS — I152 Hypertension secondary to endocrine disorders: Secondary | ICD-10-CM

## 2023-08-31 DIAGNOSIS — Z7984 Long term (current) use of oral hypoglycemic drugs: Secondary | ICD-10-CM | POA: Diagnosis not present

## 2023-08-31 DIAGNOSIS — D573 Sickle-cell trait: Secondary | ICD-10-CM

## 2023-08-31 DIAGNOSIS — E1159 Type 2 diabetes mellitus with other circulatory complications: Secondary | ICD-10-CM

## 2023-08-31 DIAGNOSIS — N1831 Chronic kidney disease, stage 3a: Secondary | ICD-10-CM

## 2023-08-31 DIAGNOSIS — E785 Hyperlipidemia, unspecified: Secondary | ICD-10-CM | POA: Diagnosis not present

## 2023-08-31 DIAGNOSIS — E119 Type 2 diabetes mellitus without complications: Secondary | ICD-10-CM

## 2023-08-31 DIAGNOSIS — E1121 Type 2 diabetes mellitus with diabetic nephropathy: Secondary | ICD-10-CM | POA: Diagnosis not present

## 2023-08-31 DIAGNOSIS — E1169 Type 2 diabetes mellitus with other specified complication: Secondary | ICD-10-CM | POA: Diagnosis not present

## 2023-08-31 DIAGNOSIS — E781 Pure hyperglyceridemia: Secondary | ICD-10-CM

## 2023-08-31 LAB — COMPREHENSIVE METABOLIC PANEL
ALT: 25 U/L (ref 0–53)
AST: 22 U/L (ref 0–37)
Albumin: 4.7 g/dL (ref 3.5–5.2)
Alkaline Phosphatase: 58 U/L (ref 39–117)
BUN: 25 mg/dL — ABNORMAL HIGH (ref 6–23)
CO2: 23 meq/L (ref 19–32)
Calcium: 9.8 mg/dL (ref 8.4–10.5)
Chloride: 105 meq/L (ref 96–112)
Creatinine, Ser: 1.52 mg/dL — ABNORMAL HIGH (ref 0.40–1.50)
GFR: 51.6 mL/min — ABNORMAL LOW (ref >60.00)
Glucose, Bld: 113 mg/dL — ABNORMAL HIGH (ref 70–99)
Potassium: 4.3 meq/L (ref 3.5–5.1)
Sodium: 140 meq/L (ref 135–145)
Total Bilirubin: 0.5 mg/dL (ref 0.2–1.2)
Total Protein: 7.5 g/dL (ref 6.0–8.3)

## 2023-08-31 LAB — HEMOGLOBIN A1C: Hgb A1c MFr Bld: 6 % (ref 4.6–6.5)

## 2023-08-31 LAB — LIPID PANEL
Cholesterol: 126 mg/dL (ref 0–200)
HDL: 24.9 mg/dL — ABNORMAL LOW (ref >39.00)
LDL Cholesterol: 36 mg/dL (ref 0–99)
NonHDL: 101.39
Total CHOL/HDL Ratio: 5
Triglycerides: 325 mg/dL — ABNORMAL HIGH (ref 0.0–149.0)
VLDL: 65 mg/dL — ABNORMAL HIGH (ref 0.0–40.0)

## 2023-08-31 NOTE — Progress Notes (Signed)
 Subjective:    Patient ID: Andrew Burgess, male    DOB: [birth year], 53 y.o.   MRN: [id number]  HPI  Wt Readings from Last 3 Encounters:  08/31/23 220 lb (99.8 kg)  05/18/23 230 lb (104.3 kg)  03/01/23 222 lb (100.7 kg)   29.84 kg/m  Vitals:   08/31/23 0805  BP: 116/78  Pulse: (!) 55  Temp: 98.6 F (37 C)  SpO2: 98%    Pt presents for follow up of DM2 and CKD and hyperlipidemia/ with chronic health problems  Needs labs   HTN (sees cardiology) Metoprolol 25 mg bid  bp is stable today  No cp or palpitations or headaches or edema  No side effects to medicines  BP Readings from Last 3 Encounters:  08/31/23 116/78  05/18/23 111/73  03/01/23 104/72       DM2 Jardiance 10 mg daily  Lab Results  Component Value Date   HGBA1C 6.0 08/31/2023   HGBA1C 6.2 02/01/2023   HGBA1C 6.3 08/03/2022   Prot/cr ratio 10/2022 at nephrology was 76   Is walking for exercise  Eating better  Needs to cut back on bread   Glucose levels have been good in general   Eye exam 12/2022     CKD / DM nephropahty Lab Results  Component Value Date   NA 140 08/31/2023   K 4.3 08/31/2023   CO2 23 08/31/2023   GLUCOSE 113 (H) 08/31/2023   BUN 25 (H) 08/31/2023   CREATININE 1.52 (H) 08/31/2023   CALCIUM 9.8 08/31/2023   GFR 51.60 (L) 08/31/2023   EGFR 50.0 11/03/2022   GFRNONAA >60 07/15/2017  Continues to see nephrology Drinks a lot of water for kidney health    Hyperlipidemia with high trig  Lab Results  Component Value Date   CHOL 126 08/31/2023   HDL 24.90 (L) 08/31/2023   LDLCALC 36 08/31/2023   LDLDIRECT 56.0 02/01/2023   TRIG 325.0 (H) 08/31/2023   CHOLHDL 5 08/31/2023   Atorvastatin 80 mg daily  Fenofibrate 160 mg daily  Lovaza (omega 3) 1 g bid  Sees cardiology for CAD  Lab Results  Component Value Date   TSH 3.00 02/01/2023   Lab Results  Component Value Date   WBC 8.7 05/18/2023   HGB 13.9 05/18/2023   HCT 41.0 05/18/2023   MCV 88.0 05/18/2023    PLT 229 05/18/2023      Tries to avoid fatty foods as much as he can   Patient Active Problem List   Diagnosis Date Noted   Hypertension associated with diabetes (HCC) 08/31/2023   Hepatic steatosis 02/08/2023   Localized swelling, mass, or lump of lower extremity 02/08/2023   Diabetes mellitus treated with oral medication (HCC) 02/08/2023   Erectile dysfunction 10/05/2022   Colon cancer screening 03/06/2019   Prostate cancer screening 10/23/2018   Chronic kidney disease, stage 3a (HCC) 05/02/2018   Family history of coronary artery disease 02/17/2018   Obesity (BMI 30-39.9) 10/25/2017   Sickle cell trait (HCC) 07/26/2017   Routine general medical examination at a health care facility 11/08/2016   Low testosterone in male 06/02/2016   Low libido 05/22/2016   CAD S/P percutaneous coronary angioplasty    Hyperlipidemia associated with type 2 diabetes mellitus (HCC)    Type 2 diabetes with nephropathy (HCC)    Status post coronary artery stent placement    Allergic rhinitis 11/11/2007   Hypertriglyceridemia 03/04/2007   Past Medical History:  Diagnosis Date   Allergy  CAD (coronary artery disease)    a. 01/2016: Peak troponin 23.15. Left heart cath on 01/21/16 showed 100% occluded mid-dist RCA occlusion with heavy thrombus burden s/p DES. Rx asp thrombectomy and post procedure Aggrastat. Re look 01/21/16 revealed persistent thrombus in prox aspect of stent as well as a distal lesion both of which were intervened on successfully with placement of 2 additional DES.   Diabetes mellitus (HCC)    Exposure to COVID-19 virus 06/05/2019   Hyperglycemia    Hyperlipidemia    triglyceriedes    Hypertriglyceridemia    Myocardial infarction [facility]) 2017   Obesity    Sickle cell trait [facility])    Past Surgical History:  Procedure Laterality Date   ANKLE SURGERY     CARDIAC CATHETERIZATION N/A 01/18/2016   Procedure: Left Heart Cath and Coronary Angiography;  Surgeon: Corky Crafts, MD;   Location: [facility] INVASIVE CV LAB;  Service: Cardiovascular;  Laterality: N/A;   CARDIAC CATHETERIZATION N/A 01/18/2016   Procedure: Coronary Stent Intervention;  Surgeon: Corky Crafts, MD;  Location: [facility] INVASIVE CV LAB;  Service: Cardiovascular;  Laterality: N/A;  MID RCA   CARDIAC CATHETERIZATION N/A 01/21/2016   Procedure: Left Heart Cath and Coronary Angiography;  Surgeon: Lennette Bihari, MD;  Location: [facility] INVASIVE CV LAB;  Service: Cardiovascular;  Laterality: N/A;   CARDIAC CATHETERIZATION N/A 01/21/2016   Procedure: Coronary Stent Intervention;  Surgeon: Lennette Bihari, MD;  Location: [facility] INVASIVE CV LAB;  Service: Cardiovascular;  Laterality: N/A;   Social History   Tobacco Use   Smoking status: Former    Types: Cigars    Quit date: 01/18/2012    Years since quitting: 11.6   Smokeless tobacco: Never  Vaping Use   Vaping status: Never Used  Substance Use Topics   Alcohol use: Yes    Alcohol/week: 0.0 standard drinks of alcohol    Comment: rare   Drug use: No   Family History  Problem Relation Age of Onset   Coronary artery disease Mother    Heart attack Mother    Hyperlipidemia Mother    Alcohol abuse Father    Hyperlipidemia Father    Hypertension Father    Hypertension Brother    Cancer Maternal Grandmother        brain   Colon polyps Neg Hx    Colon cancer Neg Hx    Esophageal cancer Neg Hx    Stomach cancer Neg Hx    Rectal cancer Neg Hx    Not on File Current Outpatient Medications on File Prior to Visit  Medication Sig Dispense Refill   aspirin EC 81 MG tablet Take 81 mg by mouth daily.     atorvastatin (LIPITOR) 80 MG tablet TAKE 1 TABLET BY MOUTH EVERY DAY 90 tablet 1   blood glucose meter kit and supplies Dispense based on patient and insurance preference. Use up to four times daily as directed. (FOR ICD-10 E10.9, E11.9). 1 each 0   fenofibrate 160 MG tablet TAKE 1 TABLET BY MOUTH EVERY DAY 90 tablet 1   JARDIANCE 10 MG TABS tablet TAKE 1 TABLET BY MOUTH EVERY  DAY BEFORE BREAKFAST 90 tablet 1   methocarbamol (ROBAXIN) 500 MG tablet Take 1 tablet (500 mg total) by mouth every 8 (eight) hours as needed for muscle spasms. 30 tablet 0   metoprolol tartrate (LOPRESSOR) 25 MG tablet TAKE 1 TABLET BY MOUTH TWICE A DAY 60 tablet 11   nitroGLYCERIN (NITROSTAT) 0.4 MG SL tablet PLACE 1 TABLET  UNDER THE TONGUE EVERY 5 MINUTES AS NEEDED FOR CHEST PAIN (3 DOSES MAX). 25 tablet 2   omega-3 acid ethyl esters (LOVAZA) 1 g capsule TAKE 1 CAPSULE BY MOUTH TWICE A DAY 180 capsule 1   ONETOUCH ULTRA test strip USE AS INSTRUCTED TO CHECK BLOOD SUGAR TWICE DAILY 100 strip 1   sildenafil (VIAGRA) 100 MG tablet Take 1 tablet (100 mg total) by mouth daily as needed for erectile dysfunction. 30 minutes before sexual activity 12 tablet 11   No current facility-administered medications on file prior to visit.    Review of Systems  Constitutional:  Positive for fatigue. Negative for activity change, appetite change, fever and unexpected weight change.       Long work hours   HENT:  Negative for congestion, rhinorrhea, sore throat and trouble swallowing.   Eyes:  Negative for pain, redness, itching and visual disturbance.  Respiratory:  Negative for cough, chest tightness, shortness of breath and wheezing.   Cardiovascular:  Negative for chest pain and palpitations.  Gastrointestinal:  Negative for abdominal pain, blood in stool, constipation, diarrhea and nausea.  Endocrine: Negative for cold intolerance, heat intolerance, polydipsia and polyuria.  Genitourinary:  Negative for difficulty urinating, dysuria, frequency and urgency.  Musculoskeletal:  Negative for arthralgias, joint swelling and myalgias.  Skin:  Negative for pallor and rash.  Neurological:  Negative for dizziness, tremors, weakness, numbness and headaches.  Hematological:  Negative for adenopathy. Does not bruise/bleed easily.  Psychiatric/Behavioral:  Negative for decreased concentration and dysphoric mood.  The patient is not nervous/anxious.        Objective:   Physical Exam Constitutional:      General: He is not in acute distress.    Appearance: Normal appearance. He is well-developed.     Comments: Overweight   HENT:     Head: Normocephalic and atraumatic.  Eyes:     Conjunctiva/sclera: Conjunctivae normal.     Pupils: Pupils are equal, round, and reactive to light.  Neck:     Thyroid: No thyromegaly.     Vascular: No carotid bruit or JVD.  Cardiovascular:     Rate and Rhythm: Normal rate and regular rhythm.     Heart sounds: Normal heart sounds.     No gallop.  Pulmonary:     Effort: Pulmonary effort is normal. No respiratory distress.     Breath sounds: Normal breath sounds. No wheezing or rales.  Abdominal:     General: There is no distension or abdominal bruit.     Palpations: Abdomen is soft.  Musculoskeletal:     Cervical back: Normal range of motion and neck supple.     Right lower leg: No edema.     Left lower leg: No edema.  Lymphadenopathy:     Cervical: No cervical adenopathy.  Skin:    General: Skin is warm and dry.     Coloration: Skin is not pale.     Findings: No rash.  Neurological:     Mental Status: He is alert.     Coordination: Coordination normal.     Deep Tendon Reflexes: Reflexes are normal and symmetric. Reflexes normal.  Psychiatric:        Mood and Affect: Mood normal.           Assessment & Plan:   Problem List Items Addressed This Visit       Cardiovascular and Mediastinum   Hypertension associated with diabetes (HCC)   Pt sees cardiology for this and CAD Taking  metoprolol 25 mg bid  BP: 116/78  Well controlled   Discussed lifestyle habits         Endocrine   Type 2 diabetes with nephropathy (HCC)   A1c today  Taking jardiance  Ahs nephrology care       Hyperlipidemia associated with type 2 diabetes mellitus (HCC)   Disc goals for lipids and reasons to control them Rev last labs with pt Rev low sat fat diet in  detail Labs today   Continues  Atorvastatin 80 mg daily  Fenofibrate 160 mg daily  Lovaza 1 g tid   Continues cardiology care for CAD      Diabetes mellitus treated with oral medication (HCC) - Primary   A1c today  Has been well controlled  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

## 2023-08-31 NOTE — Assessment & Plan Note (Signed)
 A1c today  Taking jardiance  Ahs nephrology care

## 2023-08-31 NOTE — Assessment & Plan Note (Addendum)
 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

## 2023-08-31 NOTE — Assessment & Plan Note (Addendum)
 Pt sees cardiology for this and CAD Taking metoprolol 25 mg bid  BP: 116/78  Well controlled   Discussed lifestyle habits

## 2023-08-31 NOTE — Assessment & Plan Note (Signed)
 Continues nephrology care Taking jardiance for dm  Good fluid intake  Blood pressure controlled   Encouraged to avoid nsaids when possible   (looks like he had naproxen in dec)

## 2023-08-31 NOTE — Patient Instructions (Addendum)
 Think about subbing peanut butter and apple or other fruit instead of PBJ   Take care of yourself   Continue working on a low sugar and low fat diet  Be as active as you can    Labs today for A1c , chem and cholesterol   Keep drinking lots of water

## 2023-08-31 NOTE — Assessment & Plan Note (Signed)
 Disc goals for lipids and reasons to control them Rev last labs with pt Rev low sat fat diet in detail Labs today   Continues  Atorvastatin 80 mg daily  Fenofibrate 160 mg daily  Lovaza 1 g tid   Continues cardiology care for CAD

## 2023-09-01 ENCOUNTER — Encounter: Payer: Self-pay | Admitting: *Deleted

## 2023-09-04 ENCOUNTER — Other Ambulatory Visit: Payer: Self-pay | Admitting: Family Medicine

## 2023-09-28 ENCOUNTER — Other Ambulatory Visit: Payer: Self-pay | Admitting: Family Medicine

## 2023-10-03 ENCOUNTER — Other Ambulatory Visit: Payer: Self-pay | Admitting: Family Medicine

## 2023-10-18 DIAGNOSIS — E559 Vitamin D deficiency, unspecified: Secondary | ICD-10-CM | POA: Diagnosis not present

## 2023-10-18 DIAGNOSIS — N1831 Chronic kidney disease, stage 3a: Secondary | ICD-10-CM | POA: Diagnosis not present

## 2023-10-18 DIAGNOSIS — I2510 Atherosclerotic heart disease of native coronary artery without angina pectoris: Secondary | ICD-10-CM | POA: Diagnosis not present

## 2023-10-18 DIAGNOSIS — E1122 Type 2 diabetes mellitus with diabetic chronic kidney disease: Secondary | ICD-10-CM | POA: Diagnosis not present

## 2023-10-19 LAB — LAB REPORT - SCANNED
Creatinine, POC: 63 mg/dL
EGFR: 54

## 2023-11-26 ENCOUNTER — Other Ambulatory Visit: Payer: Self-pay | Admitting: Family Medicine

## 2023-12-28 ENCOUNTER — Other Ambulatory Visit: Payer: Self-pay | Admitting: Family Medicine

## 2024-01-03 ENCOUNTER — Telehealth: Payer: Self-pay | Admitting: Family Medicine

## 2024-01-03 DIAGNOSIS — R739 Hyperglycemia, unspecified: Secondary | ICD-10-CM

## 2024-01-03 MED ORDER — NITROGLYCERIN 0.4 MG SL SUBL: SUBLINGUAL_TABLET | SUBLINGUAL | 1 refills | Status: DC

## 2024-01-03 NOTE — Telephone Encounter (Signed)
 I sent it  Please remind not to take if he is taking viagra    I think he is due for cardiology follow up in sept   Has he had to use any nitroglycerin  lately?

## 2024-01-03 NOTE — Telephone Encounter (Signed)
 Lovaza  already sent in on 11/26/23 #180 caps/ 1 refill however will route to PCP to see if she wanted to refill nitroglycerin  or if that needs to come from cardiology. PCP filled it once in 2022 for pt

## 2024-01-03 NOTE — Telephone Encounter (Unsigned)
 Copied from CRM [id number]. Topic: Clinical - Medication Refill >> Jan 03, 2024 10:35 AM Thersia C wrote: Medication: omega-3 acid ethyl esters (LOVAZA ) 1 g capsule nitroGLYCERIN  (NITROSTAT ) 0.4 MG SL tablet Has the patient contacted their pharmacy? Yes (Agent: If no, request that the patient contact the pharmacy for the refill. If patient does not wish to contact the pharmacy document the reason why and proceed with request.) (Agent: If yes, when and what did the pharmacy advise?)  This is the patient's preferred pharmacy:  [id number] [address] [place], [place] - [address] [place] [address] Phone: [phone] Fax: [phone]  Is this the correct pharmacy for this prescription? Yes If no, delete pharmacy and type the correct one.   Has the prescription been filled recently? No  Is the patient out of the medication? Yes  Has the patient been seen for an appointment in the last year OR does the patient have an upcoming appointment? Yes  Can we respond through MyChart? Yes  Agent: Please be advised that Rx refills may take up to 3 business days. We ask that you follow-up with your pharmacy.

## 2024-01-06 NOTE — Telephone Encounter (Signed)
 Left message to return call to our office.

## 2024-01-10 NOTE — Telephone Encounter (Signed)
 Called patient and reviewed all information. Patient verbalized understanding.  He has not had to use any nitroglycerin , just wanted an updated script on hand.  He will call cardiology to set up f/u appt.

## 2024-01-17 DIAGNOSIS — H02883 Meibomian gland dysfunction of right eye, unspecified eyelid: Secondary | ICD-10-CM | POA: Diagnosis not present

## 2024-01-17 DIAGNOSIS — E119 Type 2 diabetes mellitus without complications: Secondary | ICD-10-CM | POA: Diagnosis not present

## 2024-01-17 DIAGNOSIS — H02886 Meibomian gland dysfunction of left eye, unspecified eyelid: Secondary | ICD-10-CM | POA: Diagnosis not present

## 2024-01-17 LAB — HM DIABETES EYE EXAM

## 2024-02-14 ENCOUNTER — Other Ambulatory Visit: Payer: Self-pay | Admitting: Family Medicine

## 2024-02-29 ENCOUNTER — Ambulatory Visit: Admitting: Family Medicine

## 2024-02-29 ENCOUNTER — Encounter: Payer: Self-pay | Admitting: Family Medicine

## 2024-02-29 ENCOUNTER — Ambulatory Visit: Payer: Self-pay | Admitting: Family Medicine

## 2024-02-29 VITALS — BP 116/64 | HR 66 | Temp 98.3°F | Ht 71.5 in | Wt 212.1 lb

## 2024-02-29 DIAGNOSIS — Z125 Encounter for screening for malignant neoplasm of prostate: Secondary | ICD-10-CM | POA: Diagnosis not present

## 2024-02-29 DIAGNOSIS — E1169 Type 2 diabetes mellitus with other specified complication: Secondary | ICD-10-CM

## 2024-02-29 DIAGNOSIS — N1831 Chronic kidney disease, stage 3a: Secondary | ICD-10-CM

## 2024-02-29 DIAGNOSIS — E1159 Type 2 diabetes mellitus with other circulatory complications: Secondary | ICD-10-CM | POA: Diagnosis not present

## 2024-02-29 DIAGNOSIS — I152 Hypertension secondary to endocrine disorders: Secondary | ICD-10-CM | POA: Diagnosis not present

## 2024-02-29 DIAGNOSIS — E559 Vitamin D deficiency, unspecified: Secondary | ICD-10-CM | POA: Diagnosis not present

## 2024-02-29 DIAGNOSIS — E785 Hyperlipidemia, unspecified: Secondary | ICD-10-CM | POA: Diagnosis not present

## 2024-02-29 DIAGNOSIS — E1121 Type 2 diabetes mellitus with diabetic nephropathy: Secondary | ICD-10-CM

## 2024-02-29 DIAGNOSIS — E119 Type 2 diabetes mellitus without complications: Secondary | ICD-10-CM

## 2024-02-29 DIAGNOSIS — Z1211 Encounter for screening for malignant neoplasm of colon: Secondary | ICD-10-CM

## 2024-02-29 DIAGNOSIS — K760 Fatty (change of) liver, not elsewhere classified: Secondary | ICD-10-CM

## 2024-02-29 DIAGNOSIS — Z7984 Long term (current) use of oral hypoglycemic drugs: Secondary | ICD-10-CM

## 2024-02-29 DIAGNOSIS — E669 Obesity, unspecified: Secondary | ICD-10-CM

## 2024-02-29 DIAGNOSIS — E781 Pure hyperglyceridemia: Secondary | ICD-10-CM

## 2024-02-29 DIAGNOSIS — Z23 Encounter for immunization: Secondary | ICD-10-CM | POA: Diagnosis not present

## 2024-02-29 DIAGNOSIS — Z0000 Encounter for general adult medical examination without abnormal findings: Secondary | ICD-10-CM | POA: Diagnosis not present

## 2024-02-29 DIAGNOSIS — D573 Sickle-cell trait: Secondary | ICD-10-CM

## 2024-02-29 LAB — CBC WITH DIFFERENTIAL/PLATELET
Basophils Absolute: 0 K/uL (ref 0.0–0.1)
Basophils Relative: 0.4 % (ref 0.0–3.0)
Eosinophils Absolute: 0.1 K/uL (ref 0.0–0.7)
Eosinophils Relative: 1.1 % (ref 0.0–5.0)
HCT: 42.4 % (ref 39.0–52.0)
Hemoglobin: 13.9 g/dL (ref 13.0–17.0)
Lymphocytes Relative: 31.8 % (ref 12.0–46.0)
Lymphs Abs: 2.1 K/uL (ref 0.7–4.0)
MCHC: 32.9 g/dL (ref 30.0–36.0)
MCV: 89 fl (ref 78.0–100.0)
Monocytes Absolute: 0.5 K/uL (ref 0.1–1.0)
Monocytes Relative: 7.9 % (ref 3.0–12.0)
Neutro Abs: 3.9 K/uL (ref 1.4–7.7)
Neutrophils Relative %: 58.8 % (ref 43.0–77.0)
Platelets: 278 K/uL (ref 150.0–400.0)
RBC: 4.76 Mil/uL (ref 4.22–5.81)
RDW: 13.1 % (ref 11.5–15.5)
WBC: 6.6 K/uL (ref 4.0–10.5)

## 2024-02-29 LAB — COMPREHENSIVE METABOLIC PANEL WITH GFR
ALT: 23 U/L (ref 0–53)
AST: 22 U/L (ref 0–37)
Albumin: 5 g/dL (ref 3.5–5.2)
Alkaline Phosphatase: 40 U/L (ref 39–117)
BUN: 24 mg/dL — ABNORMAL HIGH (ref 6–23)
CO2: 26 meq/L (ref 19–32)
Calcium: 10.1 mg/dL (ref 8.4–10.5)
Chloride: 105 meq/L (ref 96–112)
Creatinine, Ser: 1.61 mg/dL — ABNORMAL HIGH (ref 0.40–1.50)
GFR: 47.99 mL/min — ABNORMAL LOW (ref >60.00)
Glucose, Bld: 90 mg/dL (ref 70–99)
Potassium: 4.3 meq/L (ref 3.5–5.1)
Sodium: 141 meq/L (ref 135–145)
Total Bilirubin: 0.6 mg/dL (ref 0.2–1.2)
Total Protein: 7.7 g/dL (ref 6.0–8.3)

## 2024-02-29 LAB — LIPID PANEL
Cholesterol: 122 mg/dL (ref 0–200)
HDL: 28.7 mg/dL — ABNORMAL LOW (ref >39.00)
LDL Cholesterol: 52 mg/dL (ref 0–99)
NonHDL: 93.2
Total CHOL/HDL Ratio: 4
Triglycerides: 204 mg/dL — ABNORMAL HIGH (ref 0.0–149.0)
VLDL: 40.8 mg/dL — ABNORMAL HIGH (ref 0.0–40.0)

## 2024-02-29 LAB — MICROALBUMIN / CREATININE URINE RATIO
Creatinine,U: 75.2 mg/dL
Microalb Creat Ratio: 11.8 mg/g (ref 0.0–30.0)
Microalb, Ur: 0.9 mg/dL (ref 0.0–1.9)

## 2024-02-29 LAB — TSH: TSH: 2.1 u[IU]/mL (ref 0.35–5.50)

## 2024-02-29 LAB — PSA: PSA: 0.12 ng/mL (ref 0.10–4.00)

## 2024-02-29 LAB — LDL CHOLESTEROL, DIRECT: Direct LDL: 49 mg/dL

## 2024-02-29 LAB — HEMOGLOBIN A1C: Hgb A1c MFr Bld: 5.6 % (ref 4.6–6.5)

## 2024-02-29 LAB — VITAMIN D 25 HYDROXY (VIT D DEFICIENCY, FRACTURES): VITD: 33.44 ng/mL (ref 30.00–100.00)

## 2024-02-29 NOTE — Assessment & Plan Note (Signed)
 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

## 2024-02-29 NOTE — Assessment & Plan Note (Signed)
 LFT today Commended intentional weight loss

## 2024-02-29 NOTE — Assessment & Plan Note (Signed)
 Continues nephrology care On jardiance 

## 2024-02-29 NOTE — Assessment & Plan Note (Signed)
 Commended intentional weight loss  Discussed how this problem influences overall health and the risks it imposes  Reviewed plan for weight loss with lower calorie diet (via better food choices (lower glycemic and portion control) along with exercise building up to or more than 30 minutes 5 days per week including some aerobic activity and strength training

## 2024-02-29 NOTE — Assessment & Plan Note (Signed)
 Per pt had prescription D in past (? From nephrology) Had CKD D level today Not taking any now

## 2024-02-29 NOTE — Patient Instructions (Addendum)
 If you are interested in the shingles vaccine series (Shingrix), call your insurance or pharmacy to check on coverage and location it must be given.  If affordable - you can schedule it here or at your pharmacy depending on coverage    Flu shot today   Labs today   Keep working on lifestyle change Keep up the good work!

## 2024-02-29 NOTE — Assessment & Plan Note (Signed)
 No clinical changes  Cbc today

## 2024-02-29 NOTE — Progress Notes (Signed)
 Subjective:    Patient ID: Earle LITTIE Molt, male    DOB: 03/09/1971, 53 y.o.   MRN: [id number]  HPI  Here for health maintenance exam and to review chronic medical problems   Wt Readings from Last 3 Encounters:  02/29/24 212 lb 2 oz (96.2 kg)  08/31/23 220 lb (99.8 kg)  05/18/23 230 lb (104.3 kg)   29.17 kg/m  Vitals:   02/29/24 0900  BP: 116/64  Pulse: 66  Temp: 98.3 F (36.8 C)  SpO2: 97%    Immunization History  Administered Date(s) Administered   Influenza Split 05/23/2012, 04/05/2014   Influenza, Quadrivalent, Recombinant, Inj, Pf 04/10/2017   Influenza, Seasonal, Injecte, Preservative Fre 02/08/2023   Influenza,inj,Quad PF,6+ Mos 03/10/2016, 05/02/2018, 03/06/2019, 03/20/2020, 02/10/2021, 08/03/2022   PFIZER(Purple Top)SARS-COV-2 Vaccination 10/21/2019, 11/07/2019, 06/10/2020   Pneumococcal Polysaccharide-23 07/26/2017   Td 09/15/2004   Tdap 11/23/2016    Health Maintenance Due  Topic Date Due   Diabetic kidney evaluation - Urine ACR  Never done   Hepatitis B Vaccines 19-59 Average Risk (1 of 3 - 19+ 3-dose series) Never done   Pneumococcal Vaccine: 50+ Years (2 of 2 - PCV) 07/26/2018   OPHTHALMOLOGY EXAM  01/11/2024   Influenza Vaccine  01/14/2024   Flu shot-today  Shingrix vaccine - interested   Eye exam 12/2022   Prostate health Lab Results  Component Value Date   PSA 0.11 02/01/2023   PSA 0.08 (L) 02/03/2022   PSA 0.09 (L) 02/27/2019   No problems passing urine  Occational nocturia    Colon cancer screening  Colonoscopy 11/2021 - 7 years   Bone health   Falls-none  Fractures-none  Supplements -took prescription vitamin D  in past from kidney doctor    Exercise  Walking regularly  Lifting weights in garage     Mood    02/29/2024    9:26 AM 08/31/2023    8:08 AM 02/08/2023    9:00 AM 08/03/2022    9:20 AM 02/03/2022    9:00 AM  Depression screen PHQ 2/9  Decreased Interest 0 0 0 0 0  Down, Depressed, Hopeless 1 0 1 1 0  PHQ -  2 Score 1 0 1 1 0  Altered sleeping 0 1 0 0   Tired, decreased energy 0 0 0 0   Change in appetite 0 1 0 0   Feeling bad or failure about yourself  0 0 0 0   Trouble concentrating 0 0 1 0   Moving slowly or fidgety/restless 0 0 0 0   Suicidal thoughts 0 0 0 0   PHQ-9 Score 1 2 2 1    Difficult doing work/chores Not difficult at all Not difficult at all Not difficult at all Not difficult at all      HTN bp is stable today  No cp or palpitations or headaches or edema  No side effects to medicines  BP Readings from Last 3 Encounters:  02/29/24 116/64  08/31/23 116/78  05/18/23 111/73    Sees cardiology for this and CAD Metoprolol  25 mg bid   CKD  Lab Results  Component Value Date   NA 140 08/31/2023   K 4.3 08/31/2023   CO2 23 08/31/2023   GLUCOSE 113 (H) 08/31/2023   BUN 25 (H) 08/31/2023   CREATININE 1.52 (H) 08/31/2023   CALCIUM  9.8 08/31/2023   GFR 51.60 (L) 08/31/2023   EGFR 54.0 10/19/2023   GFRNONAA >60 07/15/2017  Sees nephrology  CKD 3a   Drinks a  lot of water    Hepatic steatosis Lab Results  Component Value Date   ALT 25 08/31/2023   AST 22 08/31/2023   ALKPHOS 58 08/31/2023   BILITOT 0.5 08/31/2023     DM2 with nephropathy Lab Results  Component Value Date   HGBA1C 6.0 08/31/2023   HGBA1C 6.2 02/01/2023   HGBA1C 6.3 08/03/2022   Jardiance  10 mg daily   Lab Results  Component Value Date   MICROALBUR 4.7 11/10/2022   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     Hyperlipideima Lab Results  Component Value Date   CHOL 126 08/31/2023   HDL 24.90 (L) 08/31/2023   LDLCALC 36 08/31/2023   LDLDIRECT 56.0 02/01/2023   TRIG 325.0 (H) 08/31/2023   CHOLHDL 5 08/31/2023   Atorvastatin  80 mg daily  Fenofibrate  160 mg daily  Lovaza  1 g tid   Patient Active Problem List   Diagnosis Date Noted   Vitamin D  deficiency 02/29/2024   Hypertension associated with diabetes (HCC) 08/31/2023   Hepatic steatosis  02/08/2023   Diabetes mellitus treated with oral medication (HCC) 02/08/2023   Erectile dysfunction 10/05/2022   Colon cancer screening 03/06/2019   Prostate cancer screening 10/23/2018   Chronic kidney disease, stage 3a (HCC) 05/02/2018   Family history of coronary artery disease 02/17/2018   Obesity (BMI 30-39.9) 10/25/2017   Sickle cell trait (HCC) 07/26/2017   Routine general medical examination at a health care facility 11/08/2016   Low testosterone  in male 06/02/2016   Low libido 05/22/2016   CAD S/P percutaneous coronary angioplasty    Hyperlipidemia associated with type 2 diabetes mellitus (HCC)    Type 2 diabetes with nephropathy (HCC)    Status post coronary artery stent placement    Allergic rhinitis 11/11/2007   Hypertriglyceridemia 03/04/2007   Past Medical History:  Diagnosis Date   Allergy    CAD (coronary artery disease)    a. 01/2016: Peak troponin 23.15. Left heart cath on 01/21/16 showed 100% occluded mid-dist RCA occlusion with heavy thrombus burden s/p DES. Rx asp thrombectomy and post procedure Aggrastat . Re look 01/21/16 revealed persistent thrombus in prox aspect of stent as well as a distal lesion both of which were intervened on successfully with placement of 2 additional DES.   Diabetes mellitus (HCC)    Exposure to COVID-19 virus 06/05/2019   Hyperglycemia    Hyperlipidemia    triglyceriedes    Hypertriglyceridemia    Myocardial infarction [facility] [place]) 2017   Obesity    Sickle cell trait [facility])    Past Surgical History:  Procedure Laterality Date   ANKLE SURGERY     CARDIAC CATHETERIZATION N/A 01/18/2016   Procedure: Left Heart Cath and Coronary Angiography;  Surgeon: Candyce GORMAN Reek, MD;  Location: [facility] INVASIVE CV LAB;  Service: Cardiovascular;  Laterality: N/A;   CARDIAC CATHETERIZATION N/A 01/18/2016   Procedure: Coronary Stent Intervention;  Surgeon: Candyce GORMAN Reek, MD;  Location: [facility] INVASIVE CV LAB;  Service: Cardiovascular;  Laterality: N/A;  MID RCA    CARDIAC CATHETERIZATION N/A 01/21/2016   Procedure: Left Heart Cath and Coronary Angiography;  Surgeon: Debby DELENA Sor, MD;  Location: [facility] INVASIVE CV LAB;  Service: Cardiovascular;  Laterality: N/A;   CARDIAC CATHETERIZATION N/A 01/21/2016   Procedure: Coronary Stent Intervention;  Surgeon: Debby DELENA Sor, MD;  Location: [facility] INVASIVE CV LAB;  Service: Cardiovascular;  Laterality: N/A;   Social History   Tobacco Use   Smoking status:  Former    Types: Cigars    Quit date: 01/18/2012    Years since quitting: 12.1   Smokeless tobacco: Never  Vaping Use   Vaping status: Never Used  Substance Use Topics   Alcohol use: Yes    Alcohol/week: 0.0 standard drinks of alcohol    Comment: rare   Drug use: No   Family History  Problem Relation Age of Onset   Coronary artery disease Mother    Heart attack Mother    Hyperlipidemia Mother    Alcohol abuse Father    Hyperlipidemia Father    Hypertension Father    Hypertension Brother    Cancer Maternal Grandmother        brain   Colon polyps Neg Hx    Colon cancer Neg Hx    Esophageal cancer Neg Hx    Stomach cancer Neg Hx    Rectal cancer Neg Hx    No Known Allergies Current Outpatient Medications on File Prior to Visit  Medication Sig Dispense Refill   aspirin  EC 81 MG tablet Take 81 mg by mouth daily.     atorvastatin  (LIPITOR ) 80 MG tablet TAKE 1 TABLET BY MOUTH EVERY DAY 90 tablet 1   blood glucose meter kit and supplies Dispense based on patient and insurance preference. Use up to four times daily as directed. (FOR ICD-10 E10.9, E11.9). 1 each 0   fenofibrate  160 MG tablet TAKE 1 TABLET BY MOUTH EVERY DAY 90 tablet 1   JARDIANCE  10 MG TABS tablet TAKE 1 TABLET BY MOUTH EVERY DAY BEFORE BREAKFAST 90 tablet 1   methocarbamol  (ROBAXIN ) 500 MG tablet Take 1 tablet (500 mg total) by mouth every 8 (eight) hours as needed for muscle spasms. 30 tablet 0   metoprolol  tartrate (LOPRESSOR ) 25 MG tablet TAKE 1 TABLET BY MOUTH TWICE A DAY 60 tablet  11   nitroGLYCERIN  (NITROSTAT ) 0.4 MG SL tablet PLACE 1 TABLET UNDER THE TONGUE EVERY 5 MINUTES AS NEEDED FOR CHEST PAIN (3 DOSES MAX). 25 tablet 1   omega-3 acid ethyl esters (LOVAZA ) 1 g capsule TAKE 1 CAPSULE BY MOUTH TWICE A DAY 180 capsule 1   ONETOUCH ULTRA test strip USE AS INSTRUCTED TO CHECK BLOOD SUGAR TWICE DAILY 100 strip 1   sildenafil  (VIAGRA ) 100 MG tablet TAKE 1 TABLET (100 MG TOTAL) BY MOUTH DAILY AS NEEDED FOR ERECTILE DYSFUNCTION. 30 MINUTES BEFORE SEXUAL ACTIVITY 12 tablet 0   No current facility-administered medications on file prior to visit.    Review of Systems  Constitutional:  Negative for activity change, appetite change, fatigue, fever and unexpected weight change.  HENT:  Negative for congestion, rhinorrhea, sore throat and trouble swallowing.   Eyes:  Negative for pain, redness, itching and visual disturbance.  Respiratory:  Negative for cough, chest tightness, shortness of breath and wheezing.   Cardiovascular:  Negative for chest pain and palpitations.  Gastrointestinal:  Negative for abdominal pain, blood in stool, constipation, diarrhea and nausea.  Endocrine: Negative for cold intolerance, heat intolerance, polydipsia and polyuria.  Genitourinary:  Negative for difficulty urinating, dysuria, frequency and urgency.  Musculoskeletal:  Negative for arthralgias, joint swelling and myalgias.  Skin:  Negative for pallor and rash.  Neurological:  Negative for dizziness, tremors, weakness, numbness and headaches.  Hematological:  Negative for adenopathy. Does not bruise/bleed easily.  Psychiatric/Behavioral:  Negative for decreased concentration and dysphoric mood. The patient is not nervous/anxious.        Objective:   Physical Exam Constitutional:  General: He is not in acute distress.    Appearance: Normal appearance. He is well-developed. He is obese. He is not ill-appearing or diaphoretic.     Comments: Weight loss noted   HENT:     Head:  Normocephalic and atraumatic.     Right Ear: Tympanic membrane, ear canal and external ear normal.     Left Ear: Tympanic membrane, ear canal and external ear normal.     Nose: Nose normal. No congestion.     Mouth/Throat:     Mouth: Mucous membranes are moist.     Pharynx: Oropharynx is clear. No posterior oropharyngeal erythema.  Eyes:     General: No scleral icterus.       Right eye: No discharge.        Left eye: No discharge.     Conjunctiva/sclera: Conjunctivae normal.     Pupils: Pupils are equal, round, and reactive to light.  Neck:     Thyroid : No thyromegaly.     Vascular: No carotid bruit or JVD.  Cardiovascular:     Rate and Rhythm: Normal rate and regular rhythm.     Pulses: Normal pulses.     Heart sounds: Normal heart sounds.     No gallop.  Pulmonary:     Effort: Pulmonary effort is normal. No respiratory distress.     Breath sounds: Normal breath sounds. No wheezing or rales.     Comments: Good air exch Chest:     Chest wall: No tenderness.  Abdominal:     General: Bowel sounds are normal. There is no distension or abdominal bruit.     Palpations: Abdomen is soft. There is no mass.     Tenderness: There is no abdominal tenderness.     Hernia: No hernia is present.  Musculoskeletal:        General: No tenderness.     Cervical back: Normal range of motion and neck supple. No rigidity. No muscular tenderness.     Right lower leg: No edema.     Left lower leg: No edema.  Lymphadenopathy:     Cervical: No cervical adenopathy.  Skin:    General: Skin is warm and dry.     Coloration: Skin is not pale.     Findings: No erythema or rash.     Comments: Few lentigines on back and extremities   Neurological:     Mental Status: He is alert.     Cranial Nerves: No cranial nerve deficit.     Sensory: No sensory deficit.     Motor: No abnormal muscle tone.     Coordination: Coordination normal.     Gait: Gait normal.     Deep Tendon Reflexes: Reflexes are normal  and symmetric. Reflexes normal.  Psychiatric:        Mood and Affect: Mood normal.        Cognition and Memory: Cognition and memory normal.           Assessment & Plan:   Problem List Items Addressed This Visit       Cardiovascular and Mediastinum   Hypertension associated with diabetes (HCC)   Pt sees cardiology for this and CAD Taking metoprolol  25 mg bid  BP: 116/64  Well controlled   Discussed lifestyle habits   Lab today      Relevant Orders   Lipid Panel   Comprehensive metabolic panel with GFR   CBC with Differential/Platelet   TSH     Digestive  Hepatic steatosis   LFT today Commended intentional weight loss       Relevant Orders   Comprehensive metabolic panel with GFR     Endocrine   Type 2 diabetes with nephropathy (HCC)   Continues nephrology care On jardiance        Relevant Orders   Hemoglobin A1c   Microalbumin / creatinine urine ratio   Hyperlipidemia associated with type 2 diabetes mellitus (HCC)   Disc goals for lipids and reasons to control them Rev last labs with pt Rev low sat fat diet in detail Labs today   Continues  Atorvastatin  80 mg daily  Fenofibrate  160 mg daily  Lovaza  1 g tid   Continues cardiology care for CAD      Relevant Orders   Lipid Panel   Comprehensive metabolic panel with GFR   LDL cholesterol, direct   Diabetes mellitus treated with oral medication (HCC)   Lab Results  Component Value Date   HGBA1C 6.0 08/31/2023   HGBA1C 6.2 02/01/2023   HGBA1C 6.3 08/03/2022   A1c today  Has been well controlled  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 From nephrology) Had CKD D level today Not taking any now      Relevant Orders   VITAMIN D  25 Hydroxy (Vit-D Deficiency, Fractures)   Sickle cell trait (HCC)   No clinical changes  Cbc today      Routine general medical examination at a health care facility - Primary   Reviewed health habits including diet and exercise and skin cancer prevention Reviewed appropriate screening tests for age  Also reviewed health mt list, fam hx and immunization status , as well as social and family history   See HPI Labs reviewed and ordered Health Maintenance  Topic Date Due   Yearly kidney health urinalysis for diabetes  Never done   Hepatitis B Vaccine (1 of 3 - 19+ 3-dose series) Never done   Pneumococcal Vaccine for age over 18 (2 of 2 - PCV) 07/26/2018   Eye exam for diabetics  01/11/2024   Flu Shot  01/14/2024   Zoster (Shingles) Vaccine (1 of 2) 11/30/2024*   COVID-19 Vaccine (4 - 2025-26 season) 03/16/2025*   Hemoglobin A1C  03/02/2024   Yearly kidney function blood test for diabetes  10/18/2024   Complete foot exam   02/28/2025   DTaP/Tdap/Td vaccine (3 - Td or Tdap) 11/24/2026   Colon Cancer Screening  11/17/2028   Hepatitis C Screening  Completed   HIV Screening  Completed   HPV Vaccine  Aged Out   Meningitis B Vaccine  Aged Out  *Topic was postponed. The date shown is not the original due date.    Flu shot today Encouraged to check insurance coverage of shingrix  Eye exam report called for  Psa ordered  Discussed fall prevention, supplements and exercise for  bone density  PHQ 1         Prostate cancer screening   Psa today No urinary complaints       Relevant Orders   PSA   Obesity (BMI 30-39.9)   Commended intentional weight loss  Discussed how this problem influences overall health and the risks it imposes   Reviewed plan for weight loss with lower calorie diet (via better food choices (lower glycemic and portion control) along with exercise building up to or more than 30 minutes 5 days per week including some aerobic activity and strength training         Hypertriglyceridemia   Lab today  Lovaza  and fenofibrate        Relevant Orders   Lipid Panel   Colon cancer screening   Colonoscopy utd 11/2021  7 year recall

## 2024-02-29 NOTE — Assessment & Plan Note (Signed)
 Reviewed health habits including diet and exercise and skin cancer prevention Reviewed appropriate screening tests for age  Also reviewed health mt list, fam hx and immunization status , as well as social and family history   See HPI Labs reviewed and ordered Health Maintenance  Topic Date Due   Yearly kidney health urinalysis for diabetes  Never done   Hepatitis B Vaccine (1 of 3 - 19+ 3-dose series) Never done   Pneumococcal Vaccine for age over 62 (2 of 2 - PCV) 07/26/2018   Eye exam for diabetics  01/11/2024   Flu Shot  01/14/2024   Zoster (Shingles) Vaccine (1 of 2) 11/30/2024*   COVID-19 Vaccine (4 - 2025-26 season) 03/16/2025*   Hemoglobin A1C  03/02/2024   Yearly kidney function blood test for diabetes  10/18/2024   Complete foot exam   02/28/2025   DTaP/Tdap/Td vaccine (3 - Td or Tdap) 11/24/2026   Colon Cancer Screening  11/17/2028   Hepatitis C Screening  Completed   HIV Screening  Completed   HPV Vaccine  Aged Out   Meningitis B Vaccine  Aged Out  *Topic was postponed. The date shown is not the original due date.    Flu shot today Encouraged to check insurance coverage of shingrix  Eye exam report called for  Psa ordered  Discussed fall prevention, supplements and exercise for bone density  PHQ 1

## 2024-02-29 NOTE — Assessment & Plan Note (Signed)
 Pt sees cardiology for this and CAD Taking metoprolol  25 mg bid  BP: 116/64  Well controlled   Discussed lifestyle habits   Lab today

## 2024-02-29 NOTE — Assessment & Plan Note (Signed)
 Colonoscopy utd 11/2021  7 year recall

## 2024-02-29 NOTE — Assessment & Plan Note (Signed)
 Lab today  Lovaza  and fenofibrate 

## 2024-02-29 NOTE — Assessment & Plan Note (Signed)
 Continues nephrology care Taking jardiance  for dm  Good fluid intake  Blood pressure controlled   Encouraged to avoid nsaids when possible    [place] water intake

## 2024-02-29 NOTE — Assessment & Plan Note (Signed)
 Disc goals for lipids and reasons to control them Rev last labs with pt Rev low sat fat diet in detail Labs today   Continues  Atorvastatin 80 mg daily  Fenofibrate 160 mg daily  Lovaza 1 g tid   Continues cardiology care for CAD

## 2024-02-29 NOTE — Assessment & Plan Note (Signed)
 Psa today No urinary complaints

## 2024-03-11 ENCOUNTER — Other Ambulatory Visit: Payer: Self-pay | Admitting: Family Medicine

## 2024-03-12 NOTE — Progress Notes (Unsigned)
 Cardiology Office Note:    Date:  03/13/2024   ID:  Andrew Burgess, Andrew Burgess, MRN [id number]  PCP:  Randeen Laine LABOR, MD   [facility] Providers Cardiologist:  Redell Shallow, MD     Referring MD: Randeen Laine LABOR, MD   Chief complaint: Annual follow-up  History of Present Illness:    Andrew Burgess is a 53 y.o. male with a hx of CAD s/p NSTEMI, DES x3-RCA (with staged thrombectomy) in 2017, hyperlipidemia, and type 2 diabetes who presents for follow-up related to CAD.   Echo 01/18/2016: LVEF 55-60%, normal LV function, akinesis of basalinferoseptal myocardium, normal diastolic parameters, tricuspid AV valve that is mildly thickened, moderately calcified.  Trivial tricuspid regurgitation.  01/18/2016: Hospitalized for NSTEMI.  LHC 01/18/2016 Mid RCA to Dist RCA lesion, 100% stenosed with heavy thrombus burden, 3.5 X38 DES placed, moderately elevated LVEDP. Significant thrombus burden was still present at the end of the procedure. IV aggrastat  x 18 hours ordered followed by heparin  restart.  DAPT ordered X 1 year w/ Brilinta /aspirin . Ordered to be brought back to the lab in a few days to re-evaluate thrombus burden.   Patient did have recurrent anginal symptoms following the procedure while being maintained on heparin  drip.   LHC 01/21/2016 revealed significant residual thrombus in the proximal third of the newly stented RCA and 90% stenosis at the site of previous thrombus near the bifurcation of the PDA and continuation branch.  3.0 X12 Synergy DES placed at the distal RCA 90% bifurcation lesion that was reduced to 0% stenosis.  3.5 X28 Synergy DES placed just proximal to the previous mid RCA stent with complete resolution of residual thrombus burden.  DAPT with Brilinta /aspirin  X 1 year recommended.  Exercise tolerance testing performed annually following his NSTEMI's secondary to job requirements were mostly normal low risk studies.  ETT 03/13/2020: Did show some upsloping ST  segment depression in inferolateral leads.  No chest pain, overall low risk without evidence for ischemia.  Most recently seen in our office on 03/01/2023 for annual follow-up.  Was stable from a cardiac standpoint, denied anginal symptoms at that time.  Patient presents to the clinic today doing well from a cardiac standpoint. He denies chest pain, palpitations, dyspnea, pnd, orthopnea, n, v, dizziness, syncope, edema, weight gain, or early satiety.  States he exercises 3 days a week, is trying to exercise more.  Has been watching his diet, is trying to stay away from highly processed and fried foods.  Appears very motivated to improve his health.  Reports occasional cigar smoking.  Past Medical History:  Diagnosis Date   Allergy    CAD (coronary artery disease)    a. 01/2016: Peak troponin 23.15. Left heart cath on 01/21/16 showed 100% occluded mid-dist RCA occlusion with heavy thrombus burden s/p DES. Rx asp thrombectomy and post procedure Aggrastat . Re look 01/21/16 revealed persistent thrombus in prox aspect of stent as well as a distal lesion both of which were intervened on successfully with placement of 2 additional DES.   Diabetes mellitus (HCC)    Exposure to COVID-19 virus 06/05/2019   Hyperglycemia    Hyperlipidemia    triglyceriedes    Hypertriglyceridemia    Myocardial infarction [facility]) 2017   Obesity    Sickle cell trait     Past Surgical History:  Procedure Laterality Date   ANKLE SURGERY     CARDIAC CATHETERIZATION N/A 01/18/2016   Procedure: Left Heart Cath and Coronary Angiography;  Surgeon: Candyce RAMAN  Dann, MD;  Location: [facility] INVASIVE CV LAB;  Service: Cardiovascular;  Laterality: N/A;   CARDIAC CATHETERIZATION N/A 01/18/2016   Procedure: Coronary Stent Intervention;  Surgeon: Candyce GORMAN Dann, MD;  Location: [facility] INVASIVE CV LAB;  Service: Cardiovascular;  Laterality: N/A;  MID RCA   CARDIAC CATHETERIZATION N/A 01/21/2016   Procedure: Left Heart Cath and Coronary  Angiography;  Surgeon: Debby DELENA Sor, MD;  Location: [facility] INVASIVE CV LAB;  Service: Cardiovascular;  Laterality: N/A;   CARDIAC CATHETERIZATION N/A 01/21/2016   Procedure: Coronary Stent Intervention;  Surgeon: Debby DELENA Sor, MD;  Location: [facility] INVASIVE CV LAB;  Service: Cardiovascular;  Laterality: N/A;    Current Medications: Current Meds  Medication Sig   aspirin  EC 81 MG tablet Take 81 mg by mouth daily.   atorvastatin  (LIPITOR ) 80 MG tablet TAKE 1 TABLET BY MOUTH EVERY DAY   blood glucose meter kit and supplies Dispense based on patient and insurance preference. Use up to four times daily as directed. (FOR ICD-10 E10.9, E11.9).   JARDIANCE  10 MG TABS tablet TAKE 1 TABLET BY MOUTH EVERY DAY BEFORE BREAKFAST   methocarbamol  (ROBAXIN ) 500 MG tablet Take 1 tablet (500 mg total) by mouth every 8 (eight) hours as needed for muscle spasms.   metoprolol  tartrate (LOPRESSOR ) 25 MG tablet TAKE 1 TABLET BY MOUTH TWICE A DAY   omega-3 acid ethyl esters (LOVAZA ) 1 g capsule TAKE 1 CAPSULE BY MOUTH TWICE A DAY   ONETOUCH ULTRA test strip USE AS INSTRUCTED TO CHECK BLOOD SUGAR TWICE DAILY   sildenafil  (VIAGRA ) 100 MG tablet TAKE 1 TABLET (100 MG TOTAL) BY MOUTH DAILY AS NEEDED FOR ERECTILE DYSFUNCTION. 30 MINUTES BEFORE SEXUAL ACTIVITY   [DISCONTINUED] fenofibrate  160 MG tablet TAKE 1 TABLET BY MOUTH EVERY DAY   [DISCONTINUED] nitroGLYCERIN  (NITROSTAT ) 0.4 MG SL tablet PLACE 1 TABLET UNDER THE TONGUE EVERY 5 MINUTES AS NEEDED FOR CHEST PAIN (3 DOSES MAX).     Allergies:   Patient has no known allergies.   Social History   Socioeconomic History   Marital status: Married    Spouse name: Not on file   Number of children: 2   Years of education: Not on file   Highest education level: Not on file  Occupational History   Occupation: Truck driver  Tobacco Use   Smoking status: Former    Types: Cigars    Quit date: 01/18/2012    Years since quitting: 12.1   Smokeless tobacco: Never  Vaping Use    Vaping status: Never Used  Substance and Sexual Activity   Alcohol use: Yes    Alcohol/week: 0.0 standard drinks of alcohol    Comment: rare   Drug use: No   Sexual activity: Yes  Other Topics Concern   Not on file  Social History Narrative   Some caffeine    Social Drivers of Corporate investment banker Strain: Not on file  Food Insecurity: Not on file  Transportation Needs: Not on file  Physical Activity: Not on file  Stress: Not on file  Social Connections: Unknown (10/24/2021)   Received from [employer]   Social Network    Social Network: Not on file     Family History: The patient's family history includes Alcohol abuse in his father; Cancer in his maternal grandmother; Coronary artery disease in his mother; Heart attack in his mother; Hyperlipidemia in his father and mother; Hypertension in his brother and father. There is no history of Colon polyps, Colon cancer, Esophageal  cancer, Stomach cancer, or Rectal cancer.  ROS:   Please see the history of present illness.    All other systems reviewed and are negative.  EKGs/Labs/Other Studies Reviewed:    The following studies were reviewed today:  EKG Interpretation Date/Time:  Monday March 13 2024 09:43:36 EDT Ventricular Rate:  68 PR Interval:  176 QRS Duration:  78 QT Interval:  388 QTC Calculation: 412 R Axis:   28  Text Interpretation: Normal sinus rhythm T wave abnormality in inferior leads unchanged from previous studies. No significant change since last tracing Confirmed by Yvett Rossel [phone]) on 03/13/2024 9:58:54 AM    Recent Labs: 02/29/2024: ALT 23; BUN 24; Creatinine, Ser 1.61; Hemoglobin 13.9; Platelets 278.0; Potassium 4.3; Sodium 141; TSH 2.10  Recent Lipid Panel    Component Value Date/Time   CHOL 122 02/29/2024 0930   TRIG 204.0 (H) 02/29/2024 0930   HDL 28.70 (L) 02/29/2024 0930   CHOLHDL 4 02/29/2024 0930   VLDL 40.8 (H) 02/29/2024 0930   LDLCALC 52 02/29/2024 0930   LDLDIRECT  49.0 02/29/2024 0930     Physical Exam:    VS:  BP 104/60   Pulse 68   Ht 6' (1.829 m)   Wt 217 lb 12.8 oz (98.8 kg)   SpO2 97%   BMI 29.54 kg/m     Wt Readings from Last 3 Encounters:  03/13/24 217 lb 12.8 oz (98.8 kg)  02/29/24 212 lb 2 oz (96.2 kg)  08/31/23 220 lb (99.8 kg)     GEN:  Well nourished, well developed in no acute distress HEENT: Normal NECK: No carotid bruits CARDIAC: RRR, no murmurs, rubs, gallops RESPIRATORY:  Clear to auscultation without rales, wheezing or rhonchi  ABDOMEN: Soft, non-tender, non-distended MUSCULOSKELETAL:  No edema; No deformity, 2+ DP/PT pulses bilaterally SKIN: Warm and dry NEUROLOGIC:  Alert and oriented x 3 PSYCHIATRIC:  Normal affect   ASSESSMENT:    1. Coronary artery disease involving native coronary artery of native heart, unspecified whether angina present   2. Primary hypertension   3. Hyperlipidemia, unspecified hyperlipidemia type   4. Elevated blood sugar    PLAN:    In order of problems listed above:  CAD s/p DES X3-RCA in 2017 EKG: NSR 68 bpm, T wave abnormality in inferior leads unchanged from prior studies Denies CP, SOB, DOE, orthopnea, palpitations, LE edema  Continue aspirin  EC 81 mg daily Continue atorvastatin  80 mg daily Continue Lopressor  25 mg daily Continue nitroglycerin  0.4 mg SL as needed every 5 minutes for chest pain, has not taken Discussed importance of avoiding nitrate administration within 48 hours of sildenafil  use Discussed smoking cessation, patient states he would d/c cigar smoking moving forward.  HTN BPs reported well-controlled  Labs 02/29/2024: BUN 24, creatinine 1.61 Kidney function appears stable, not significantly changed from prior readings Continue Lopressor  25 mg daily  HLD LDL goal <55 Lipid panel 02/29/2024: Cholesterol 122, HDL 28, direct LDL 49, triglyceride 204 LFTs 02/29/2024: AST 22, ALT 23 Continue atorvastatin  80 mg daily Continue fenofibrate  160 mg daily Continue  omega-3 fatty acids (lovaza ) 1 g twice daily  Follow up in 1 year or if new symptoms occur   Medication Adjustments/Labs and Tests Ordered: Current medicines are reviewed at length with the patient today.  Concerns regarding medicines are outlined above.  Orders Placed This Encounter  Procedures   EKG 12-Lead   Meds ordered this encounter  Medications   nitroGLYCERIN  (NITROSTAT ) 0.4 MG SL tablet    Sig: PLACE 1  TABLET UNDER THE TONGUE EVERY 5 MINUTES AS NEEDED FOR CHEST PAIN (3 DOSES MAX).    Dispense:  25 tablet    Refill:  3    DX Code Needed  .    Patient Instructions  Medication Instructions:  Your physician recommends that you continue on your current medications as directed. Please refer to the Current Medication list given to you today.  *If you need a refill on your cardiac medications before your next appointment, please call your pharmacy*  Lab Work: NONE ordered at this time of appointment   Testing/Procedures: NONE ordered at this time of appointment    Follow-Up: At [facility], you and your health needs are our priority.  As part of our continuing mission to provide you with exceptional heart care, our providers are all part of one team.  This team includes your primary Cardiologist (physician) and Advanced Practice Providers or APPs (Physician Assistants and Nurse Practitioners) who all work together to provide you with the care you need, when you need it.  Your next appointment:   1 year(s)  Provider:   Redell Shallow, MD    We recommend signing up for the patient portal called MyChart.  Sign up information is provided on this After Visit Summary.  MyChart is used to connect with patients for Virtual Visits (Telemedicine).  Patients are able to view lab/test results, encounter notes, upcoming appointments, etc.  Non-urgent messages can be sent to your provider as well.   To learn more about what you can do with MyChart, go to  [email or web address].             Signed, Miriam FORBES Shams, NP  03/13/2024 10:27 AM    [facility]

## 2024-03-13 ENCOUNTER — Encounter: Payer: Self-pay | Admitting: Nurse Practitioner

## 2024-03-13 ENCOUNTER — Ambulatory Visit: Attending: Nurse Practitioner | Admitting: Emergency Medicine

## 2024-03-13 VITALS — BP 104/60 | HR 68 | Ht 72.0 in | Wt 217.8 lb

## 2024-03-13 DIAGNOSIS — I10 Essential (primary) hypertension: Secondary | ICD-10-CM | POA: Diagnosis not present

## 2024-03-13 DIAGNOSIS — E785 Hyperlipidemia, unspecified: Secondary | ICD-10-CM

## 2024-03-13 DIAGNOSIS — I2510 Atherosclerotic heart disease of native coronary artery without angina pectoris: Secondary | ICD-10-CM

## 2024-03-13 MED ORDER — NITROGLYCERIN 0.4 MG SL SUBL: SUBLINGUAL_TABLET | SUBLINGUAL | 3 refills | Status: DC

## 2024-03-13 NOTE — Patient Instructions (Signed)
 Medication Instructions:  Your physician recommends that you continue on your current medications as directed. Please refer to the Current Medication list given to you today.  *If you need a refill on your cardiac medications before your next appointment, please call your pharmacy*  Lab Work: NONE ordered at this time of appointment    Testing/Procedures: NONE ordered at this time of appointment    Follow-Up: At [facility], you and your health needs are our priority.  As part of our continuing mission to provide you with exceptional heart care, our providers are all part of one team.  This team includes your primary Cardiologist (physician) and Advanced Practice Providers or APPs (Physician Assistants and Nurse Practitioners) who all work together to provide you with the care you need, when you need it.  Your next appointment:   1 year(s)  Provider:   Alexandria Angel, MD    We recommend signing up for the patient portal called "MyChart".  Sign up information is provided on this After Visit Summary.  MyChart is used to connect with patients for Virtual Visits (Telemedicine).  Patients are able to view lab/test results, encounter notes, upcoming appointments, etc.  Non-urgent messages can be sent to your provider as well.   To learn more about what you can do with MyChart, go to [email or web address].

## 2024-03-29 ENCOUNTER — Other Ambulatory Visit: Payer: Self-pay | Admitting: Family Medicine

## 2024-04-05 ENCOUNTER — Other Ambulatory Visit: Payer: Self-pay | Admitting: Family Medicine

## 2024-06-02 ENCOUNTER — Other Ambulatory Visit: Payer: Self-pay | Admitting: Nurse Practitioner

## 2024-06-24 ENCOUNTER — Other Ambulatory Visit: Payer: Self-pay | Admitting: Family Medicine

## 2024-06-30 ENCOUNTER — Other Ambulatory Visit: Payer: Self-pay | Admitting: Emergency Medicine

## 2024-06-30 DIAGNOSIS — I2510 Atherosclerotic heart disease of native coronary artery without angina pectoris: Secondary | ICD-10-CM
